# Patient Record
Sex: Male | Born: 1950 | Race: White | Hispanic: No | Marital: Married | Smoking: Former smoker
Health system: Southern US, Community
[De-identification: ages and names within clinical notes are randomized; demographics above are authoritative.]

## PROBLEM LIST (undated history)

## (undated) DIAGNOSIS — C801 Malignant (primary) neoplasm, unspecified: Secondary | ICD-10-CM

## (undated) DIAGNOSIS — M1990 Unspecified osteoarthritis, unspecified site: Secondary | ICD-10-CM

## (undated) DIAGNOSIS — I10 Essential (primary) hypertension: Secondary | ICD-10-CM

## (undated) DIAGNOSIS — E785 Hyperlipidemia, unspecified: Secondary | ICD-10-CM

## (undated) DIAGNOSIS — J189 Pneumonia, unspecified organism: Secondary | ICD-10-CM

## (undated) DIAGNOSIS — I519 Heart disease, unspecified: Secondary | ICD-10-CM

## (undated) HISTORY — DX: Essential (primary) hypertension: I10

## (undated) HISTORY — PX: CORONARY STENT PLACEMENT: SHX1402

## (undated) HISTORY — DX: Heart disease, unspecified: I51.9

## (undated) HISTORY — PX: OTHER SURGICAL HISTORY: SHX169

## (undated) HISTORY — DX: Hyperlipidemia, unspecified: E78.5

---

## 2000-05-31 ENCOUNTER — Ambulatory Visit (HOSPITAL_COMMUNITY): Admission: RE | Admit: 2000-05-31 | Discharge: 2000-05-31 | Payer: Self-pay | Admitting: Family Medicine

## 2000-05-31 ENCOUNTER — Encounter: Payer: Self-pay | Admitting: Family Medicine

## 2000-08-27 ENCOUNTER — Ambulatory Visit (HOSPITAL_COMMUNITY): Admission: RE | Admit: 2000-08-27 | Discharge: 2000-08-27 | Payer: Self-pay | Admitting: Gastroenterology

## 2000-09-30 ENCOUNTER — Other Ambulatory Visit: Admission: RE | Admit: 2000-09-30 | Discharge: 2000-09-30 | Payer: Self-pay | Admitting: General Surgery

## 2000-09-30 ENCOUNTER — Encounter (INDEPENDENT_AMBULATORY_CARE_PROVIDER_SITE_OTHER): Payer: Self-pay | Admitting: Specialist

## 2001-09-29 ENCOUNTER — Encounter: Payer: Self-pay | Admitting: Family Medicine

## 2001-09-29 ENCOUNTER — Ambulatory Visit (HOSPITAL_COMMUNITY): Admission: RE | Admit: 2001-09-29 | Discharge: 2001-09-29 | Payer: Self-pay | Admitting: Family Medicine

## 2003-07-28 ENCOUNTER — Ambulatory Visit (HOSPITAL_COMMUNITY): Admission: RE | Admit: 2003-07-28 | Discharge: 2003-07-28 | Payer: Self-pay | Admitting: Family Medicine

## 2008-12-03 ENCOUNTER — Ambulatory Visit (HOSPITAL_COMMUNITY): Admission: RE | Admit: 2008-12-03 | Discharge: 2008-12-03 | Payer: Self-pay | Admitting: General Surgery

## 2010-03-15 ENCOUNTER — Ambulatory Visit: Payer: Self-pay | Admitting: Diagnostic Radiology

## 2010-03-15 ENCOUNTER — Emergency Department (HOSPITAL_BASED_OUTPATIENT_CLINIC_OR_DEPARTMENT_OTHER): Admission: EM | Admit: 2010-03-15 | Discharge: 2010-03-15 | Payer: Self-pay | Admitting: Emergency Medicine

## 2010-09-22 LAB — COMPREHENSIVE METABOLIC PANEL
ALT: 27 U/L (ref 0–53)
BUN: 8 mg/dL (ref 6–23)
Calcium: 9.4 mg/dL (ref 8.4–10.5)
Creatinine, Ser: 0.9 mg/dL (ref 0.4–1.5)
Glucose, Bld: 114 mg/dL — ABNORMAL HIGH (ref 70–99)
Sodium: 140 mEq/L (ref 135–145)
Total Protein: 7.4 g/dL (ref 6.0–8.3)

## 2010-09-22 LAB — DIFFERENTIAL
Lymphocytes Relative: 21 % (ref 12–46)
Lymphs Abs: 2.1 10*3/uL (ref 0.7–4.0)
Monocytes Relative: 5 % (ref 3–12)
Neutro Abs: 7.2 10*3/uL (ref 1.7–7.7)
Neutrophils Relative %: 72 % (ref 43–77)

## 2010-09-22 LAB — CBC
Hemoglobin: 14.5 g/dL (ref 13.0–17.0)
MCHC: 33.9 g/dL (ref 30.0–36.0)
MCV: 94.6 fL (ref 78.0–100.0)
RDW: 13.8 % (ref 11.5–15.5)

## 2010-10-28 NOTE — Op Note (Signed)
NAMECARSYN, Andrew Solomon                ACCOUNT NO.:  [id number]   MEDICAL RECORD NO.:  [id number]          PATIENT TYPE:  AMB   LOCATION:  DAY                          FACILITY:  [facility]   PHYSICIAN:  Adolph Pollack, M.D.DATE OF BIRTH:  Mar 05, 1951   DATE OF PROCEDURE:  12/03/2008  DATE OF DISCHARGE:                               OPERATIVE REPORT   PREOPERATIVE DIAGNOSIS:  Left inguinal hernia.   POSTOPERATIVE DIAGNOSIS:  Direct left inguinal hernia.   PROCEDURE:  Left inguinal repair with mesh.   SURGEON:  Adolph Pollack, M.D.   ANESTHESIA:  General/LMA with Marcaine local.   INDICATIONS:  Mr. Andrew Solomon is a 60 year old male who for about a couple  years has noticed a left inguinal bulge and a known hernia.  He started  to become more symptomatic and it is becoming larger and he requests  repair.  We discussed a laparoscopic versus open technique and he has  chosen the open technique and now presents for repair.  Procedure, risks  and aftercare were discussed with him preoperatively.   DESCRIPTION OF PROCEDURE:  He was seen in the holding area and the left  groin marked with my initials.  He was then brought to the operating  room, placed supine on the operating table.  General anesthetic was  administered.  The hair in the left groin was clipped and the area was  sterilely prepped and draped.  Marcaine was infiltrated superficially  and deep in the left groin.  Left groin incision was made through the  skin, subcutaneous tissue and Scarpa fascia exposing the external  oblique aponeurosis.  Local anesthetic was infiltrated deep in the  external oblique aponeurosis and the external oblique aponeurosis was  cut medially through the external ring and laterally up toward the  anterior superior iliac spine.  The underlying ilioinguinal nerve was  identified, preserved and anesthetized.   Using blunt dissection I exposed the shelving edge of the inguinal  ligament inferiorly and the  internal oblique aponeurosis superiorly.  I  then isolated the spermatic cord, created a window around it.  A direct  hernia sac was adherent to the spermatic cord.  I dissected this free  using blunt dissection and reduced it.  I then kept the reduction by  loosely approximating the attenuated transversalis fascia with  interrupted 2-0 Vicryl sutures.   I examined the cord and no indirect sac was noted.   Following this a piece of three x a 6 inches polypropylene mesh was  brought onto the field and anchored 2 cm medial to the pubic tubercle  with 2-0 Prolene suture.  The inferior aspect of mesh was anchored to  the shelving edge of the inguinal ligament.  A running 2-0 Prolene  suture up the level 2 cm lateral to the internal ring.  A slit was cut  the mesh creating two tails and these were wrapped around the spermatic  cord.  These superior aspect of mesh was then anchored to the internal  oblique aponeurosis with interrupted 2-0 Vicryl sutures.  Following this  the two tails were crossed  creating a new internal ring and these were  anchored to the shelving edge of the inguinal ligament of 2-0 Prolene  suture.  The tip of the hemostat could be placed in new aperture.   After this I inspected the area and hemostasis was adequate.  The  lateral aspect of mesh was then tucked deep in the external oblique  aponeurosis which was closed over the mesh and cord with a running 3-0  Vicryl suture.  Scarpa fascia closed with running 3-0 Vicryl suture.  The skin was closed with 4-0 Monocryl subcuticular stitch followed by  Steri-Strips and sterile dressings.  The left testicle was in its normal  position in the scrotum.   He tolerated procedure without apparent complications and was taken to  recovery room in satisfactory condition.      Adolph Pollack, M.D.  Electronically Signed     TJR/MEDQ  D:  12/03/2008  T:  12/03/2008  Job:  [id number]   cc:   Holley Bouche, M.D.  Fax:  [phone]

## 2010-10-31 NOTE — Op Note (Signed)
[place]  Patient:    Andrew Solomon, Andrew Solomon                       MRN: [id number] Proc. Date: 08/27/00 Adm. Date:  [id number] Disc. Date: [id number] Attending:  Charna Elizabeth CC:         Arvella Merles, M.D.  Adolph Pollack, M.D.   Operative Report  DATE OF BIRTH:  [birth year].  PROCEDURE:  Colonoscopy with biopsies.  ENDOSCOPIST:  Anselmo Rod, M.D.  INSTRUMENT USED:  Olympus video colonoscope.  INDICATION FOR PROCEDURE:  Occasional rectal bleeding in a 60 year old white male.  Colorectal cancer screening is being done.  Rule out colonic polyps, masses, hemorrhoids, etc.  PREPROCEDURE PREPARATION:  Informed consent was procured from the patient. The patient was fasted for eight hours prior to the procedure and prepped with a bottle of magnesium citrate and a gallon of NuLytely the night prior to the procedure.  PREPROCEDURE PHYSICAL:  VITAL SIGNS:  The patient had stable vital signs.  NECK:  Supple.  CHEST:  Clear to auscultation.  S1, S2 regular.  ABDOMEN:  Soft with normal abdominal bowel sounds.  DESCRIPTION OF PROCEDURE:  The patient was placed in the left lateral decubitus position and sedated with 75 mg of Demerol and 10 mg of Versed intravenously.  Once the patient was adequately sedate and maintained on low-flow oxygen and continuous cardiac monitoring, the Olympus video colonoscope was advanced from the rectum to the cecum without difficulty. There were patchy areas of erythema in the left colon at 25 cm, biopsied for pathology.  The exact etiology of this seemed unclear.  There was a small polyp seen in the rectum _____ of the hemorrhoidal plexus, which was not biopsied or removed.  The rest of the colonic mucosa up to the _____ appeared normal.  IMPRESSION: 1. Patchy area of erythema in left colon at 25 cm, biopsied for pathology. 2. Small polyp in the rectum at the anal verge associated with a hemorrhoidal  plexus.  RECOMMENDATIONS: 1. Await pathology results. 2. Surgical evaluation by Dr. Abbey Chatters to remove the polyp of the rectum. 3. Outpatient follow-up in the next two weeks. DD:  08/27/00 TD:  08/28/00 Job: [id number]

## 2012-10-13 ENCOUNTER — Other Ambulatory Visit: Payer: Self-pay | Admitting: Orthopedic Surgery

## 2012-10-13 DIAGNOSIS — M84374A Stress fracture, right foot, initial encounter for fracture: Secondary | ICD-10-CM

## 2012-10-14 ENCOUNTER — Ambulatory Visit
Admission: RE | Admit: 2012-10-14 | Discharge: 2012-10-14 | Disposition: A | Discharge status: Home / Self Care | Payer: No Typology Code available for payment source | Source: Ambulatory Visit | Attending: Orthopedic Surgery | Admitting: Orthopedic Surgery

## 2012-10-14 ENCOUNTER — Inpatient Hospital Stay: Admission: RE | Admit: 2012-10-14 | Payer: Self-pay | Source: Ambulatory Visit

## 2012-10-14 DIAGNOSIS — M84374A Stress fracture, right foot, initial encounter for fracture: Secondary | ICD-10-CM

## 2013-08-24 DIAGNOSIS — L570 Actinic keratosis: Secondary | ICD-10-CM | POA: Diagnosis not present

## 2013-08-24 DIAGNOSIS — L930 Discoid lupus erythematosus: Secondary | ICD-10-CM | POA: Diagnosis not present

## 2013-08-24 DIAGNOSIS — L259 Unspecified contact dermatitis, unspecified cause: Secondary | ICD-10-CM | POA: Diagnosis not present

## 2013-08-24 DIAGNOSIS — L821 Other seborrheic keratosis: Secondary | ICD-10-CM | POA: Diagnosis not present

## 2013-08-24 DIAGNOSIS — L819 Disorder of pigmentation, unspecified: Secondary | ICD-10-CM | POA: Diagnosis not present

## 2013-08-24 DIAGNOSIS — D1801 Hemangioma of skin and subcutaneous tissue: Secondary | ICD-10-CM | POA: Diagnosis not present

## 2013-08-24 DIAGNOSIS — L219 Seborrheic dermatitis, unspecified: Secondary | ICD-10-CM | POA: Diagnosis not present

## 2013-09-22 DIAGNOSIS — F411 Generalized anxiety disorder: Secondary | ICD-10-CM | POA: Diagnosis not present

## 2013-09-22 DIAGNOSIS — Z0000 Encounter for general adult medical examination without abnormal findings: Secondary | ICD-10-CM | POA: Diagnosis not present

## 2013-09-22 DIAGNOSIS — Z87891 Personal history of nicotine dependence: Secondary | ICD-10-CM | POA: Diagnosis not present

## 2013-09-22 DIAGNOSIS — Z23 Encounter for immunization: Secondary | ICD-10-CM | POA: Diagnosis not present

## 2013-09-22 DIAGNOSIS — E785 Hyperlipidemia, unspecified: Secondary | ICD-10-CM | POA: Diagnosis not present

## 2013-09-22 DIAGNOSIS — I10 Essential (primary) hypertension: Secondary | ICD-10-CM | POA: Diagnosis not present

## 2013-09-22 DIAGNOSIS — Z125 Encounter for screening for malignant neoplasm of prostate: Secondary | ICD-10-CM | POA: Diagnosis not present

## 2013-09-22 DIAGNOSIS — Z136 Encounter for screening for cardiovascular disorders: Secondary | ICD-10-CM | POA: Diagnosis not present

## 2013-09-22 DIAGNOSIS — E291 Testicular hypofunction: Secondary | ICD-10-CM | POA: Diagnosis not present

## 2013-09-26 ENCOUNTER — Other Ambulatory Visit: Payer: Self-pay | Admitting: Family Medicine

## 2013-09-26 DIAGNOSIS — Z87891 Personal history of nicotine dependence: Secondary | ICD-10-CM

## 2013-09-29 DIAGNOSIS — I2510 Atherosclerotic heart disease of native coronary artery without angina pectoris: Secondary | ICD-10-CM | POA: Diagnosis not present

## 2013-09-29 DIAGNOSIS — E785 Hyperlipidemia, unspecified: Secondary | ICD-10-CM | POA: Diagnosis not present

## 2013-09-29 DIAGNOSIS — Z9861 Coronary angioplasty status: Secondary | ICD-10-CM | POA: Diagnosis not present

## 2013-09-29 DIAGNOSIS — I739 Peripheral vascular disease, unspecified: Secondary | ICD-10-CM | POA: Diagnosis not present

## 2013-11-09 ENCOUNTER — Ambulatory Visit
Admission: RE | Admit: 2013-11-09 | Discharge: 2013-11-09 | Disposition: A | Discharge status: Home / Self Care | Payer: Medicare Other | Source: Ambulatory Visit | Attending: Family Medicine | Admitting: Family Medicine

## 2013-11-09 ENCOUNTER — Encounter (INDEPENDENT_AMBULATORY_CARE_PROVIDER_SITE_OTHER): Payer: Self-pay

## 2013-11-09 DIAGNOSIS — Z136 Encounter for screening for cardiovascular disorders: Secondary | ICD-10-CM | POA: Diagnosis not present

## 2013-11-09 DIAGNOSIS — Z87891 Personal history of nicotine dependence: Secondary | ICD-10-CM

## 2013-11-10 ENCOUNTER — Ambulatory Visit: Payer: No Typology Code available for payment source

## 2013-11-10 DIAGNOSIS — R319 Hematuria, unspecified: Secondary | ICD-10-CM | POA: Diagnosis not present

## 2013-11-14 DIAGNOSIS — R310 Gross hematuria: Secondary | ICD-10-CM | POA: Diagnosis not present

## 2013-11-23 DIAGNOSIS — N529 Male erectile dysfunction, unspecified: Secondary | ICD-10-CM | POA: Diagnosis not present

## 2013-11-23 DIAGNOSIS — R310 Gross hematuria: Secondary | ICD-10-CM | POA: Diagnosis not present

## 2013-11-24 DIAGNOSIS — E291 Testicular hypofunction: Secondary | ICD-10-CM | POA: Diagnosis not present

## 2014-02-12 DIAGNOSIS — R0789 Other chest pain: Secondary | ICD-10-CM | POA: Diagnosis not present

## 2014-02-12 DIAGNOSIS — E785 Hyperlipidemia, unspecified: Secondary | ICD-10-CM | POA: Diagnosis not present

## 2014-02-12 DIAGNOSIS — Z9861 Coronary angioplasty status: Secondary | ICD-10-CM | POA: Diagnosis not present

## 2014-02-12 DIAGNOSIS — I2510 Atherosclerotic heart disease of native coronary artery without angina pectoris: Secondary | ICD-10-CM | POA: Diagnosis not present

## 2014-02-12 DIAGNOSIS — I739 Peripheral vascular disease, unspecified: Secondary | ICD-10-CM | POA: Diagnosis not present

## 2014-02-21 DIAGNOSIS — IMO0002 Reserved for concepts with insufficient information to code with codable children: Secondary | ICD-10-CM | POA: Diagnosis not present

## 2014-02-21 DIAGNOSIS — J309 Allergic rhinitis, unspecified: Secondary | ICD-10-CM | POA: Diagnosis not present

## 2014-02-21 DIAGNOSIS — L02519 Cutaneous abscess of unspecified hand: Secondary | ICD-10-CM | POA: Diagnosis not present

## 2014-03-02 DIAGNOSIS — Z23 Encounter for immunization: Secondary | ICD-10-CM | POA: Diagnosis not present

## 2014-03-15 DIAGNOSIS — Z955 Presence of coronary angioplasty implant and graft: Secondary | ICD-10-CM | POA: Diagnosis not present

## 2014-03-15 DIAGNOSIS — I739 Peripheral vascular disease, unspecified: Secondary | ICD-10-CM | POA: Diagnosis not present

## 2014-03-15 DIAGNOSIS — I2510 Atherosclerotic heart disease of native coronary artery without angina pectoris: Secondary | ICD-10-CM | POA: Diagnosis not present

## 2014-03-15 DIAGNOSIS — E785 Hyperlipidemia, unspecified: Secondary | ICD-10-CM | POA: Diagnosis not present

## 2014-03-15 DIAGNOSIS — R0789 Other chest pain: Secondary | ICD-10-CM | POA: Diagnosis not present

## 2015-11-13 ENCOUNTER — Telehealth: Payer: Self-pay | Admitting: Acute Care

## 2015-11-15 NOTE — Telephone Encounter (Signed)
Patient called back to schedule -prm

## 2015-11-21 ENCOUNTER — Ambulatory Visit
Admission: RE | Admit: 2015-11-21 | Discharge: 2015-11-21 | Disposition: A | Discharge status: Home / Self Care | Payer: Medicare Other | Source: Ambulatory Visit | Attending: Family Medicine | Admitting: Family Medicine

## 2015-11-21 ENCOUNTER — Other Ambulatory Visit: Payer: Self-pay | Admitting: Family Medicine

## 2015-11-21 DIAGNOSIS — J180 Bronchopneumonia, unspecified organism: Secondary | ICD-10-CM

## 2015-11-22 NOTE — Telephone Encounter (Signed)
Referring PCP called checking on status of appt -prm

## 2015-11-28 ENCOUNTER — Other Ambulatory Visit: Payer: Self-pay | Admitting: Acute Care

## 2015-11-28 DIAGNOSIS — Z87891 Personal history of nicotine dependence: Secondary | ICD-10-CM

## 2015-12-02 ENCOUNTER — Ambulatory Visit: Admission: RE | Admit: 2015-12-02 | Payer: Self-pay | Source: Ambulatory Visit

## 2015-12-02 ENCOUNTER — Ambulatory Visit (INDEPENDENT_AMBULATORY_CARE_PROVIDER_SITE_OTHER): Payer: Medicare Other | Admitting: Acute Care

## 2015-12-02 ENCOUNTER — Encounter: Payer: Self-pay | Admitting: Acute Care

## 2015-12-02 DIAGNOSIS — Z87891 Personal history of nicotine dependence: Secondary | ICD-10-CM | POA: Diagnosis not present

## 2015-12-02 NOTE — Progress Notes (Signed)
Shared Decision Making Visit Lung Cancer Screening Program [phone])   Eligibility:  Age 65 y.o.  Pack Years Smoking History Calculation 47 pack year smoking history (# packs/per year x # years smoked)  Recent History of coughing up blood  No   Unexplained weight loss? no ( >Than 15 pounds within the last 6 months )  Prior History Lung / other cancer no (Diagnosis within the last 5 years already requiring surveillance chest CT Scans).  Smoking Status Former Smoker  Former Smokers: Years since quit: 5 years  Quit Date: 2012  Visit Components:  Discussion included one or more decision making aids. yes  Discussion included risk/benefits of screening. yes  Discussion included potential follow up diagnostic testing for abnormal scans. yes  Discussion included meaning and risk of over diagnosis. yes  Discussion included meaning and risk of False Positives. yes  Discussion included meaning of total radiation exposure. yes  Counseling Included:  Importance of adherence to annual lung cancer LDCT screening. yes  Impact of comorbidities on ability to participate in the program. yes  Ability and willingness to under diagnostic treatment. yes  Smoking Cessation Counseling:  Current Smokers:   Discussed importance of smoking cessation. NA, former smoker  Information about tobacco cessation classes and interventions provided to patient. yes  Patient provided with "ticket" for LDCT Scan. yes  Symptomatic Patient. no  Counseling NA  Diagnosis Code: Tobacco Use Z72.0  Asymptomatic Patient yes  Counseling (Intermediate counseling: > three minutes counseling) [id number]  Former Smokers:   Discussed the importance of maintaining cigarette abstinence. yes  Diagnosis Code: Personal History of Nicotine Dependence. [id number]  Information about tobacco cessation classes and interventions provided to patient. Yes  Patient provided with "ticket" for LDCT Scan. yes  Written Order  for Lung Cancer Screening with LDCT placed in Epic. Yes (CT Chest Lung Cancer Screening Low Dose W/O CM) [id number] Z12.2-Screening of respiratory organs Z87.891-Personal history of nicotine dependence   I spent 20 minutes of face to face time with Andrew Solomon discussing the risks and benefits of lung cancer screening. We viewed a power point together that explained in detail the above noted topics. We took the time to pause the power point at intervals to allow for questions to be asked and answered to ensure understanding. We discussed that he had taken the single most powerful action possible to decrease his risk of developing lung cancer when he quit smoking. I counseled him to remain smoke free, and to contact me if he ever had the desire to smoke again so that I can provide resources and tools to help support the effort to remain smoke free. We discussed the time and location of the scan, and that either [place] or I will call with the results within  24-48 hours of receiving them. He has my card and contact information in the event he needs to speak with me, in addition to a copy of the power point we reviewed as a resource. He verbalized understanding of all of the above and had no further questions upon leaving the office.   Andrew Spatz, NP  12/02/2015

## 2015-12-03 ENCOUNTER — Ambulatory Visit
Admission: RE | Admit: 2015-12-03 | Discharge: 2015-12-03 | Disposition: A | Discharge status: Home / Self Care | Payer: Medicare Other | Source: Ambulatory Visit | Attending: Family Medicine | Admitting: Family Medicine

## 2015-12-03 ENCOUNTER — Other Ambulatory Visit: Payer: Self-pay | Admitting: Family Medicine

## 2015-12-03 DIAGNOSIS — J189 Pneumonia, unspecified organism: Secondary | ICD-10-CM

## 2015-12-03 DIAGNOSIS — Z09 Encounter for follow-up examination after completed treatment for conditions other than malignant neoplasm: Secondary | ICD-10-CM

## 2015-12-11 NOTE — Telephone Encounter (Signed)
Attempted to contact pt 3 times and call was disconnected each time. WCB.

## 2015-12-27 ENCOUNTER — Ambulatory Visit (INDEPENDENT_AMBULATORY_CARE_PROVIDER_SITE_OTHER)
Admission: RE | Admit: 2015-12-27 | Discharge: 2015-12-27 | Disposition: A | Discharge status: Home / Self Care | Payer: Medicare Other | Source: Ambulatory Visit | Attending: Acute Care | Admitting: Acute Care

## 2015-12-27 ENCOUNTER — Telehealth: Payer: Self-pay | Admitting: Acute Care

## 2015-12-27 DIAGNOSIS — Z87891 Personal history of nicotine dependence: Secondary | ICD-10-CM

## 2015-12-27 NOTE — Telephone Encounter (Signed)
I have called the results of Andrew Solomon CT scan to him. I explained that it was an abnormal scan, but that the radiologist who read the scan feels this is reflective of post infectious/ inflammatory scarring due to recent pneumonia. (Dr. Weber Cooks called me and spoke with me about best plan moving forward with this scan.) Andrew Solomon states he is still coughing from a recent bronchitis. I have encouraged him to get in touch with the MD who has been treating the bronchitis for further treatment if he feels he is not well. I explained that per Dr. Jonnie Kind suggestion, we will repeat the CT scan in 3 months to re-evaluate the inflammatory changes. He verbalized understanding and had no further questions.

## 2016-01-01 ENCOUNTER — Encounter: Payer: Self-pay | Admitting: Pulmonary Disease

## 2016-01-01 ENCOUNTER — Ambulatory Visit (INDEPENDENT_AMBULATORY_CARE_PROVIDER_SITE_OTHER): Payer: Medicare Other | Admitting: Pulmonary Disease

## 2016-01-01 VITALS — BP 108/68 | HR 75 | Ht 67.0 in | Wt 167.6 lb

## 2016-01-01 DIAGNOSIS — R918 Other nonspecific abnormal finding of lung field: Secondary | ICD-10-CM

## 2016-01-01 DIAGNOSIS — J432 Centrilobular emphysema: Secondary | ICD-10-CM

## 2016-01-01 NOTE — Progress Notes (Signed)
Past surgical history He  has past surgical history that includes Coronary stent placement (Left).  Family history His family history includes Bladder Cancer in his mother; Heart disease in his father; Pancreatic cancer in his father.  Social history He  reports that he quit smoking about 6 months ago. His smoking use included Cigarettes. He has a 47 pack-year smoking history. He does not have any smokeless tobacco history on file. He reports that he drinks alcohol. He reports that he does not use illicit drugs.  Allergies  Allergen Reactions  . Oxycodone   . Sulfur     No current outpatient prescriptions on file prior to visit.   No current facility-administered medications on file prior to visit.    Review of Systems  Constitutional: Negative for fever and unexpected weight change.  HENT: Negative for congestion, dental problem, ear pain, nosebleeds, postnasal drip, rhinorrhea, sinus pressure, sneezing, sore throat and trouble swallowing.   Eyes: Negative for redness and itching.  Respiratory: Positive for cough. Negative for chest tightness, shortness of breath and wheezing.   Cardiovascular: Negative for palpitations and leg swelling.  Gastrointestinal: Negative for nausea and vomiting.  Genitourinary: Negative for dysuria.  Musculoskeletal: Positive for joint swelling.  Skin: Negative for rash.  Neurological: Negative for headaches.  Hematological: Does not bruise/bleed easily.  Psychiatric/Behavioral: Negative for dysphoric mood. The patient is not nervous/anxious.     Chief Complaint  Patient presents with  . PULMONARY CONSULT    Referred by Andrew Solomon. Pt currently in [place]- seen 12/02/15. Recent CT  12/27/15.     Tests Low dose CT chest 12/27/15 >> atherosclerosis, calcified hilar nodes, multiple pulmonary nodules b/l, mild centrilobular and paraseptal emphysema  Past medical history He  has a past medical history of HTN  (hypertension); Hyperlipidemia; and Heart disease.  Vital signs BP 108/68 mmHg  Pulse 75  Ht 5\' 7"  (1.702 m)  Wt 167 lb 9.6 oz (76.023 kg)  BMI 26.24 kg/m2  SpO2 98%  History of present illness Andrew Solomon is a 65 y.o. male former smoker with abnormal CT chest.  He developed a respiratory infection a few months ago.  He was told he had the flu.  He wasn't really able to get over this, and eventually developed a pneumonia.  He had fever up to 101F.  He was on several course of therapy.  He was also started on albuterol inhaler.  During this time he was set up for low dose CT chest for lung cancer screening, given his history of smoking.  This showed right upper lobe ground glass area, mild emphysema, calcified granulomas, and atherosclerosis.  He was seen by his cardiologist and advised that coronary calcification was not significant.  He had several episodes of pneumonia over the past few years.  He had trouble with terrible episodes of bronchitis in his teen age years.  He was never told he had asthma.  He denies history of tuberculosis.  He works in Press photographer.  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  He denies animal exposures or sick exposures.  No recent travel history.  He is from [place].  He was never in the [employer].    He still has some chest congestion and feels like he can't always get a full breath.  Using albuterol has helped with this.  He will get episodes of wheezing if he exerts himself too much.  He denies sinus congestion, sore throat, or hemoptysis.  He is no longer having fever.  He denies sweats, weight loss, skin rash, or joint swelling.   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  Neuro - Normal strength, cranial nerves intact Skin - No rashes Psych - Normal mood, and behavior   CMP Latest Ref Rng 11/30/2008  Glucose 70 - 99 mg/dL 114(H)  BUN 6 - 23 mg/dL 8  Creatinine 0.4 - 1.5 mg/dL 0.90  Sodium 135 - 145 mEq/L 140  Potassium 3.5 - 5.1 mEq/L 3.5  Chloride 96 - 112 mEq/L 108  CO2 19 - 32 mEq/L 26  Calcium 8.4 - 10.5 mg/dL 9.4  Total Protein 6.0 - 8.3 g/dL 7.4  Total Bilirubin 0.3 - 1.2 mg/dL 0.6  Alkaline Phos 39 - 117 U/L 67  AST 0 - 37 U/L 30  ALT 0 - 53 U/L 27     CBC Latest Ref Rng 11/30/2008  WBC 4.0 - 10.5 K/uL 10.0  Hemoglobin 13.0 - 17.0 g/dL 14.5  Hematocrit 39.0 - 52.0 % 42.9  Platelets 150 - 400 K/uL 211    Dg Chest 2 View  12/03/2015  CLINICAL DATA:  Flu.  Pneumonia. EXAM: CHEST  2 VIEW COMPARISON:  11/21/2015.  03/15/2010. FINDINGS: Mediastinum and hilar structures are normal. Right apical and left lower lobe stable nodular interstitial changes consistent with scarring and/or granulomas disease. No focal acute infiltrate. Stable bilateral nipple shadows. Heart size normal. No acute bony abnormality. IMPRESSION: Changes consistent nodular pleural scarring and/or granulomas disease. No acute abnormality identified. Electronically Signed   By: Andrew Solomon  Register   On: 12/03/2015 10:12   Ct Chest Lung Ca Screen Low Dose W/o Cm  12/27/2015  CLINICAL DATA:  65 year old male former smoker (quit 5 years ago) with 47 pack year history of smoking. Lung cancer screening examination. EXAM: CT CHEST WITHOUT CONTRAST LOW-DOSE FOR LUNG CANCER SCREENING TECHNIQUE: Multidetector CT imaging of the chest was performed following the standard protocol without IV contrast. COMPARISON:  No priors. FINDINGS: Mediastinum/Nodes: Heart size is normal. There is no significant pericardial fluid, thickening or pericardial calcification. There is aortic atherosclerosis, as well as  atherosclerosis of the great vessels of the mediastinum and the coronary arteries, including calcified atherosclerotic plaque in the left anterior descending, left circumflex and right coronary arteries. No pathologically enlarged mediastinal or hilar lymph nodes. Please note that accurate exclusion of hilar adenopathy is limited on noncontrast CT scans. Several densely calcified right hilar lymph nodes are incidentally noted. Esophagus is unremarkable in appearance. No axillary lymphadenopathy. Lungs/Pleura: There are multiple pulmonary nodules scattered throughout the lungs bilaterally. The largest and most concerning of which is a mixed ground-glass attenuation and solid lesion in the right upper lobe which demonstrates a volume derived mean diameter of 41.7 mm for the ground-glass attenuation component and 8.1 mm for the solid component (image 79 of series 3). There are some nearby calcified granulomas in the right upper lobe, and this entire area in the right upper lobe is remarkable for some architectural distortion, and there is overlying pleural thickening and retraction superiorly and laterally which could indicate chronic post infectious/inflammatory scarring. Diffuse bronchial wall thickening with mild centrilobular and paraseptal  emphysema. No acute consolidative airspace disease. No pleural effusions. Upper abdomen: Aortic atherosclerosis. Musculoskeletal: There are no aggressive appearing lytic or blastic lesions noted in the visualized portions of the skeleton. IMPRESSION: 1. Lung-RADS Category 4BS, suspicious. Given the presence of the right upper lobe pneumonia on prior chest radiograph 10/29/2015, and partial interval resolution of the findings on more recent chest radiographs, despite the appearance of this lesion this is favored to reflect post infectious/inflammatory scarring. Additional imaging evaluation or consultation with pulmonary medicine or thoracic surgery recommended. Preferred imaging  followup is a repeat chest CT without contrast (please use the following order, "CT CHEST LCS NODULE FOLLOW-UP W/O CM"). 2. The "S" modifier above refers to potentially clinically significant non lung cancer related findings. Specifically, there is aortic atherosclerosis, in addition to 3 vessel coronary artery disease. Please note that although the presence of coronary artery calcium documents the presence of coronary artery disease, the severity of this disease and any potential stenosis cannot be assessed on this non-gated CT examination. Assessment for potential risk factor modification, dietary therapy or pharmacologic therapy may be warranted, if clinically indicated. 3. Mild diffuse bronchial wall thickening with mild centrilobular and paraseptal emphysema. 19. 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 These results were called by telephone at the time of interpretation on 12/27/2015 at 1:20 am to Andrew Solomon , who verbally acknowledged these results. Electronically Signed   By: Andrew Solomon M.D.   On: 12/27/2015 13:20     Discussion He had recent episodes of respiratory infections with bronchitis and pneumonia.  He has prior history of pneumonia over the past few years.  He is slowly recovering.  He has prior history of smoking.  His recent imaging studies are more suggestive of resolving pneumonia in right upper lung, and old granulomas likely from prior respiratory infections.  It is however possible that right upper lobe lesion could represents something more ominous, but this is less likely.   Assessment/plan  Pulmonary infiltrate right upper lung. - will schedule CT chest w/o contrast for October 2017 - I explained that waiting for follow up CT chest would not likely change diagnostic or treatment options, but could allow [place] to avoid additional testing if CT chest shows improvement  Emphysema with hx of tobacco abuse. - will arrange for pulmonary function testing to further  asssess - continue prn albuterol for now  Coronary atherosclerosis on CT chest. - he is followed by Andrew Solomon   Patient Instructions  Will schedule pulmonary function test Follow up in October 2017 after CT chest done     Andrew Mires, Andrew Solomon  Pulmonary/Critical Care/Sleep Pager:  [phone] 01/01/2016, 4:40 PM

## 2016-01-01 NOTE — Progress Notes (Signed)
   Subjective:    Patient ID: Andrew Solomon, male    DOB: [birth year], 65 y.o.   MRN: [id number]  HPI    Review of Systems  Constitutional: Negative for fever and unexpected weight change.  HENT: Negative for congestion, dental problem, ear pain, nosebleeds, postnasal drip, rhinorrhea, sinus pressure, sneezing, sore throat and trouble swallowing.   Eyes: Negative for redness and itching.  Respiratory: Positive for cough. Negative for chest tightness, shortness of breath and wheezing.   Cardiovascular: Negative for palpitations and leg swelling.  Gastrointestinal: Negative for nausea and vomiting.  Genitourinary: Negative for dysuria.  Musculoskeletal: Positive for joint swelling.  Skin: Negative for rash.  Neurological: Negative for headaches.  Hematological: Does not bruise/bleed easily.  Psychiatric/Behavioral: Negative for dysphoric mood. The patient is not nervous/anxious.        Objective:   Physical Exam        Assessment & Plan:

## 2016-01-01 NOTE — Patient Instructions (Addendum)
Will schedule pulmonary function test Follow up in October 2017 after CT chest done

## 2016-01-03 NOTE — Telephone Encounter (Signed)
Referral Notes     Type Date User   General 11/28/2015 10:44 AM Abigal Choung P        Note   Called spoke with pt. Reviewed the lung cancer screening with him and he does qualify. Scheduled SDMV 12/02/15 with SG. CT order placed. Nothing further needed.      Nothing further needed.

## 2016-01-31 ENCOUNTER — Institutional Professional Consult (permissible substitution): Payer: Self-pay | Admitting: Internal Medicine

## 2016-03-06 ENCOUNTER — Encounter (INDEPENDENT_AMBULATORY_CARE_PROVIDER_SITE_OTHER): Payer: Medicare Other | Admitting: Pulmonary Disease

## 2016-03-06 DIAGNOSIS — J432 Centrilobular emphysema: Secondary | ICD-10-CM | POA: Diagnosis not present

## 2016-03-06 LAB — PULMONARY FUNCTION TEST
DL/VA % pred: 92 %
DL/VA: 4.04 ml/min/mmHg/L
DLCO COR % PRED: 83 %
DLCO COR: 23.03 ml/min/mmHg
DLCO UNC: 19.75 ml/min/mmHg
DLCO unc % pred: 71 %
FEF 25-75 POST: 2.02 L/s
FEF 25-75 PRE: 1.28 L/s
FEF2575-%Change-Post: 58 %
FEF2575-%PRED-PRE: 56 %
FEF2575-%Pred-Post: 88 %
FEV1-%Change-Post: 12 %
FEV1-%PRED-POST: 91 %
FEV1-%PRED-PRE: 81 %
FEV1-POST: 2.67 L
FEV1-Pre: 2.37 L
FEV1FVC-%Change-Post: 4 %
FEV1FVC-%PRED-PRE: 91 %
FEV6-%CHANGE-POST: 8 %
FEV6-%PRED-POST: 101 %
FEV6-%PRED-PRE: 93 %
FEV6-POST: 3.75 L
FEV6-Pre: 3.46 L
FEV6FVC-%CHANGE-POST: 0 %
FEV6FVC-%PRED-POST: 105 %
FEV6FVC-%Pred-Pre: 104 %
FVC-%Change-Post: 7 %
FVC-%PRED-POST: 95 %
FVC-%PRED-PRE: 89 %
FVC-POST: 3.78 L
FVC-PRE: 3.51 L
POST FEV6/FVC RATIO: 99 %
PRE FEV1/FVC RATIO: 67 %
Post FEV1/FVC ratio: 71 %
Pre FEV6/FVC Ratio: 99 %
RV % pred: 128 %
RV: 2.85 L
TLC % PRED: 106 %
TLC: 6.74 L

## 2016-03-30 ENCOUNTER — Telehealth: Payer: Self-pay | Admitting: Pulmonary Disease

## 2016-03-30 ENCOUNTER — Ambulatory Visit
Admission: RE | Admit: 2016-03-30 | Discharge: 2016-03-30 | Disposition: A | Discharge status: Home / Self Care | Payer: Medicare Other | Source: Ambulatory Visit | Attending: Pulmonary Disease | Admitting: Pulmonary Disease

## 2016-03-30 ENCOUNTER — Ambulatory Visit (INDEPENDENT_AMBULATORY_CARE_PROVIDER_SITE_OTHER)
Admission: RE | Admit: 2016-03-30 | Discharge: 2016-03-30 | Disposition: A | Discharge status: Home / Self Care | Payer: Medicare Other | Source: Ambulatory Visit

## 2016-03-30 DIAGNOSIS — R918 Other nonspecific abnormal finding of lung field: Secondary | ICD-10-CM

## 2016-03-30 DIAGNOSIS — Z87891 Personal history of nicotine dependence: Secondary | ICD-10-CM

## 2016-03-30 NOTE — Telephone Encounter (Signed)
Okay to change order  

## 2016-03-30 NOTE — Telephone Encounter (Signed)
Spoke with Andrew Solomon in [place]. She is aware to use SG's order for Lung Cancer CT. Nothing further was needed.

## 2016-03-30 NOTE — Telephone Encounter (Signed)
Spoke with Lattie Haw at [place] on [facility] -- calling about the orders placed for CT follow up.  Pt is a part of the [place] and per Lattie Haw the Radiologists called her this morning stating that the patient's follow up CT must be a Low Dose Nodule Screen not a regular CT w/o contrast d/t there being findings of Lung-RADS Category 4BS on the 12/27/15 scan. Requesting permission to scan the patient under the order placed by Sarah for Lung Nodule follow up and cancel the regular CT w/o order.  Lattie Haw states that if the patient cannot be scanned as the Low Dose Nodule f/u then Dr Weber Cooks requests a call to discuss. Please advise Dr Halford Chessman. Thanks.

## 2016-03-31 ENCOUNTER — Telehealth: Payer: Self-pay | Admitting: Acute Care

## 2016-03-31 DIAGNOSIS — Z87891 Personal history of nicotine dependence: Secondary | ICD-10-CM

## 2016-03-31 NOTE — Telephone Encounter (Signed)
I have called Andrew Solomon with the results of his follow up  low-dose CT scan for lung cancer screening. I explained to him that his scan was read as a Lung RADS 2: nodules that are benign in appearance and behavior with a very low likelihood of becoming a clinically active cancer due to size or lack of growth. Recommendation per radiology is for a repeat LDCT in 12 months. I explained that we would order and schedule his follow-up exam for October 2018. I also explained to him that there was a finding of aortic atherosclerosis in addition to three-vessel coronary artery disease. I explained that this was a common finding with this exam, and that while presence of calcification is noted degree and severity cannot be determined by this non-gated exam. Andrew Solomon is currently being followed by Dr. Tollie Eth, cardiology,  and is taking Crestor. He has cholesterol checks 3 times annually and his cholesterol and triglycerides are well controlled at present. Andrew Solomon verbalized understanding of the above and had no further questions at completion of the call. He has my contact information in the event he has any further questions in the future.

## 2016-06-02 ENCOUNTER — Telehealth: Payer: Self-pay | Admitting: *Deleted

## 2016-06-02 NOTE — Telephone Encounter (Signed)
-----   Message from Chesley Mires, MD sent at 05/21/2016  3:52 PM EST ----- Mr. Tabora was supposed to have ROV with me after doing his PFT and CT chest.  It doesn't look like ROV was scheduled.  Can you please arrange for this.

## 2016-06-02 NOTE — Telephone Encounter (Signed)
Pt states that he has received CT scan results but still needs PFT results.  Pt not able to come in any earlier than after the new year. Pt scheduled for appt with Dr Halford Chessman 06/19/16 @ 11:15. Nothing further needed.

## 2016-06-19 ENCOUNTER — Ambulatory Visit (INDEPENDENT_AMBULATORY_CARE_PROVIDER_SITE_OTHER): Payer: Medicare Other | Admitting: Pulmonary Disease

## 2016-06-19 ENCOUNTER — Encounter: Payer: Self-pay | Admitting: Pulmonary Disease

## 2016-06-19 VITALS — BP 122/70 | HR 71 | Ht 67.0 in | Wt 177.6 lb

## 2016-06-19 DIAGNOSIS — R918 Other nonspecific abnormal finding of lung field: Secondary | ICD-10-CM

## 2016-06-19 DIAGNOSIS — J432 Centrilobular emphysema: Secondary | ICD-10-CM | POA: Diagnosis not present

## 2016-06-19 DIAGNOSIS — Z7722 Contact with and (suspected) exposure to environmental tobacco smoke (acute) (chronic): Secondary | ICD-10-CM

## 2016-06-19 NOTE — Patient Instructions (Signed)
Ask Dr. 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

## 2016-06-19 NOTE — Progress Notes (Signed)
Current Outpatient Prescriptions on File Prior to Visit  Medication Sig  . atenolol (TENORMIN) 25 MG tablet Take 25 mg by mouth daily.  Marland Kitchen buPROPion (WELLBUTRIN SR) 150 MG 12 hr tablet Take 150 mg by mouth daily.  . Cholecalciferol (VITAMIN D3) 1000 units CAPS Take 1 capsule by mouth daily.  . folic acid (FOLVITE) A999333 MCG tablet Take 400 mcg by mouth daily.  . Pyridoxine HCl (VITAMIN B-6) 500 MG tablet Take 500 mg by mouth daily.  . rosuvastatin (CRESTOR) 20 MG tablet Take 20 mg by mouth daily.  . Selenium (SELENIMIN PO) Take 1 capsule by mouth daily.  . vitamin B-12 (CYANOCOBALAMIN) 1000 MCG tablet Take 1,000 mcg by mouth daily.   No current facility-administered medications on file prior to visit.      Chief Complaint  Patient presents with  . Follow-up    Review PFT and CT. Denies any problems with breathing since last OV     Tests Low dose CT chest 12/27/15 >> atherosclerosis, calcified hilar nodes, multiple pulmonary nodules b/l, mild centrilobular and paraseptal emphysema PFT 03/06/16 >> FEV1 2.67 (91%), FEV1% 71, TLC 6.74 (106%), DLCO 71%, +BD Low dose CT chest 03/30/16 >> multiple nodules   Past medical history HTN, HLD, CAD  Past surgical history, Family history, Social history, Allergies all reviewed.  Vital Signs BP 122/70 (BP Location: Left Arm, Cuff Size: Normal)   Pulse 71   Ht 5\' 7"  (1.702 m)   Wt 177 lb 9.6 oz (80.6 kg)   SpO2 99%   BMI 27.82 kg/m   History of Present Illness Andrew Solomon is a 66 y.o. male former smoker with COPD/emphysema and asthma, lung nodules.  He is here to review CT chest and PFT.  CT chest stable nodules.  PFT showed mild COPD, bronchodilator response, and mild diffusion defect.  He does not have significant symptoms.  He denies cough, wheeze, sputum, chest pain, leg swelling.  Doesn't feel like his breathing limits his activity unless he bends over.  Then he feels like he can't get a full breath.  He has a proair inhaler, but  doesn't feel the need to use it.   Physical Exam  General - No distress ENT - No sinus tenderness, no oral exudate, no LAN Cardiac - s1s2 regular, no murmur Chest - No wheeze/rales/dullness Back - No focal tenderness Abd - Soft, non-tender Ext - No edema Neuro - Normal strength Skin - No rashes Psych - normal mood, and behavior   Assessment/Plan  COPD with emphysema and asthma. - minimal symptoms - advised him to d/w his PCP about getting prevnar vaccination - prn proair  Lung cancer screening. - follow up low dose CT chest in October 2018  Second hand tobacco exposure. - he will d/w his wife his recent CT and PFT findings, and hopefully this will help motivate her to stop smoking   Patient Instructions  Ask Dr. 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

## 2016-09-25 DIAGNOSIS — L570 Actinic keratosis: Secondary | ICD-10-CM | POA: Diagnosis not present

## 2016-09-25 DIAGNOSIS — L812 Freckles: Secondary | ICD-10-CM | POA: Diagnosis not present

## 2016-09-25 DIAGNOSIS — Z85828 Personal history of other malignant neoplasm of skin: Secondary | ICD-10-CM | POA: Diagnosis not present

## 2016-09-25 DIAGNOSIS — D1801 Hemangioma of skin and subcutaneous tissue: Secondary | ICD-10-CM | POA: Diagnosis not present

## 2016-09-25 DIAGNOSIS — L821 Other seborrheic keratosis: Secondary | ICD-10-CM | POA: Diagnosis not present

## 2016-09-25 DIAGNOSIS — L218 Other seborrheic dermatitis: Secondary | ICD-10-CM | POA: Diagnosis not present

## 2016-10-16 DIAGNOSIS — F17201 Nicotine dependence, unspecified, in remission: Secondary | ICD-10-CM | POA: Diagnosis not present

## 2016-10-16 DIAGNOSIS — F419 Anxiety disorder, unspecified: Secondary | ICD-10-CM | POA: Diagnosis not present

## 2016-10-16 DIAGNOSIS — Z125 Encounter for screening for malignant neoplasm of prostate: Secondary | ICD-10-CM | POA: Diagnosis not present

## 2016-10-16 DIAGNOSIS — E785 Hyperlipidemia, unspecified: Secondary | ICD-10-CM | POA: Diagnosis not present

## 2016-10-16 DIAGNOSIS — Z0000 Encounter for general adult medical examination without abnormal findings: Secondary | ICD-10-CM | POA: Diagnosis not present

## 2016-10-16 DIAGNOSIS — M545 Low back pain: Secondary | ICD-10-CM | POA: Diagnosis not present

## 2016-11-06 DIAGNOSIS — M545 Low back pain: Secondary | ICD-10-CM | POA: Diagnosis not present

## 2016-11-06 DIAGNOSIS — M546 Pain in thoracic spine: Secondary | ICD-10-CM | POA: Diagnosis not present

## 2016-11-12 DIAGNOSIS — M546 Pain in thoracic spine: Secondary | ICD-10-CM | POA: Diagnosis not present

## 2016-11-12 DIAGNOSIS — M545 Low back pain: Secondary | ICD-10-CM | POA: Diagnosis not present

## 2016-11-17 DIAGNOSIS — M546 Pain in thoracic spine: Secondary | ICD-10-CM | POA: Diagnosis not present

## 2016-11-17 DIAGNOSIS — M545 Low back pain: Secondary | ICD-10-CM | POA: Diagnosis not present

## 2016-11-20 DIAGNOSIS — M545 Low back pain: Secondary | ICD-10-CM | POA: Diagnosis not present

## 2016-11-20 DIAGNOSIS — M546 Pain in thoracic spine: Secondary | ICD-10-CM | POA: Diagnosis not present

## 2016-11-24 DIAGNOSIS — M546 Pain in thoracic spine: Secondary | ICD-10-CM | POA: Diagnosis not present

## 2016-11-24 DIAGNOSIS — M545 Low back pain: Secondary | ICD-10-CM | POA: Diagnosis not present

## 2016-11-27 DIAGNOSIS — M546 Pain in thoracic spine: Secondary | ICD-10-CM | POA: Diagnosis not present

## 2016-11-27 DIAGNOSIS — M545 Low back pain: Secondary | ICD-10-CM | POA: Diagnosis not present

## 2016-12-01 DIAGNOSIS — M546 Pain in thoracic spine: Secondary | ICD-10-CM | POA: Diagnosis not present

## 2016-12-01 DIAGNOSIS — M545 Low back pain: Secondary | ICD-10-CM | POA: Diagnosis not present

## 2016-12-04 DIAGNOSIS — M546 Pain in thoracic spine: Secondary | ICD-10-CM | POA: Diagnosis not present

## 2016-12-04 DIAGNOSIS — M545 Low back pain: Secondary | ICD-10-CM | POA: Diagnosis not present

## 2017-02-23 DIAGNOSIS — R05 Cough: Secondary | ICD-10-CM | POA: Diagnosis not present

## 2017-02-23 DIAGNOSIS — J069 Acute upper respiratory infection, unspecified: Secondary | ICD-10-CM | POA: Diagnosis not present

## 2017-02-25 DIAGNOSIS — Z955 Presence of coronary angioplasty implant and graft: Secondary | ICD-10-CM | POA: Diagnosis not present

## 2017-02-25 DIAGNOSIS — I2510 Atherosclerotic heart disease of native coronary artery without angina pectoris: Secondary | ICD-10-CM | POA: Diagnosis not present

## 2017-02-25 DIAGNOSIS — E785 Hyperlipidemia, unspecified: Secondary | ICD-10-CM | POA: Diagnosis not present

## 2017-02-25 DIAGNOSIS — I739 Peripheral vascular disease, unspecified: Secondary | ICD-10-CM | POA: Diagnosis not present

## 2017-04-01 ENCOUNTER — Ambulatory Visit (INDEPENDENT_AMBULATORY_CARE_PROVIDER_SITE_OTHER)
Admission: RE | Admit: 2017-04-01 | Discharge: 2017-04-01 | Disposition: A | Discharge status: Home / Self Care | Payer: Medicare Other | Source: Ambulatory Visit | Attending: Acute Care | Admitting: Acute Care

## 2017-04-01 DIAGNOSIS — Z87891 Personal history of nicotine dependence: Secondary | ICD-10-CM | POA: Diagnosis not present

## 2017-04-05 ENCOUNTER — Other Ambulatory Visit: Payer: Self-pay | Admitting: Acute Care

## 2017-04-05 DIAGNOSIS — Z122 Encounter for screening for malignant neoplasm of respiratory organs: Secondary | ICD-10-CM

## 2017-04-05 DIAGNOSIS — Z87891 Personal history of nicotine dependence: Secondary | ICD-10-CM

## 2017-04-23 DIAGNOSIS — M5441 Lumbago with sciatica, right side: Secondary | ICD-10-CM | POA: Diagnosis not present

## 2017-04-23 DIAGNOSIS — F419 Anxiety disorder, unspecified: Secondary | ICD-10-CM | POA: Diagnosis not present

## 2017-04-23 DIAGNOSIS — Z23 Encounter for immunization: Secondary | ICD-10-CM | POA: Diagnosis not present

## 2017-04-23 DIAGNOSIS — F17201 Nicotine dependence, unspecified, in remission: Secondary | ICD-10-CM | POA: Diagnosis not present

## 2017-04-23 DIAGNOSIS — I700 Atherosclerosis of aorta: Secondary | ICD-10-CM | POA: Diagnosis not present

## 2017-05-20 DIAGNOSIS — E785 Hyperlipidemia, unspecified: Secondary | ICD-10-CM | POA: Diagnosis not present

## 2017-05-20 DIAGNOSIS — I739 Peripheral vascular disease, unspecified: Secondary | ICD-10-CM | POA: Diagnosis not present

## 2017-05-20 DIAGNOSIS — I2510 Atherosclerotic heart disease of native coronary artery without angina pectoris: Secondary | ICD-10-CM | POA: Diagnosis not present

## 2017-05-20 DIAGNOSIS — Z955 Presence of coronary angioplasty implant and graft: Secondary | ICD-10-CM | POA: Diagnosis not present

## 2017-06-18 DIAGNOSIS — I739 Peripheral vascular disease, unspecified: Secondary | ICD-10-CM | POA: Diagnosis not present

## 2017-06-18 DIAGNOSIS — E7849 Other hyperlipidemia: Secondary | ICD-10-CM | POA: Diagnosis not present

## 2017-06-18 DIAGNOSIS — E785 Hyperlipidemia, unspecified: Secondary | ICD-10-CM | POA: Diagnosis not present

## 2017-06-18 DIAGNOSIS — I2510 Atherosclerotic heart disease of native coronary artery without angina pectoris: Secondary | ICD-10-CM | POA: Diagnosis not present

## 2017-06-18 DIAGNOSIS — Z955 Presence of coronary angioplasty implant and graft: Secondary | ICD-10-CM | POA: Diagnosis not present

## 2017-07-23 DIAGNOSIS — J0100 Acute maxillary sinusitis, unspecified: Secondary | ICD-10-CM | POA: Diagnosis not present

## 2017-12-02 DIAGNOSIS — M9903 Segmental and somatic dysfunction of lumbar region: Secondary | ICD-10-CM | POA: Diagnosis not present

## 2017-12-02 DIAGNOSIS — M5134 Other intervertebral disc degeneration, thoracic region: Secondary | ICD-10-CM | POA: Diagnosis not present

## 2017-12-02 DIAGNOSIS — M9902 Segmental and somatic dysfunction of thoracic region: Secondary | ICD-10-CM | POA: Diagnosis not present

## 2017-12-02 DIAGNOSIS — M5136 Other intervertebral disc degeneration, lumbar region: Secondary | ICD-10-CM | POA: Diagnosis not present

## 2017-12-07 DIAGNOSIS — M9903 Segmental and somatic dysfunction of lumbar region: Secondary | ICD-10-CM | POA: Diagnosis not present

## 2017-12-07 DIAGNOSIS — M5136 Other intervertebral disc degeneration, lumbar region: Secondary | ICD-10-CM | POA: Diagnosis not present

## 2017-12-07 DIAGNOSIS — M9902 Segmental and somatic dysfunction of thoracic region: Secondary | ICD-10-CM | POA: Diagnosis not present

## 2017-12-07 DIAGNOSIS — M5134 Other intervertebral disc degeneration, thoracic region: Secondary | ICD-10-CM | POA: Diagnosis not present

## 2017-12-10 DIAGNOSIS — Z0000 Encounter for general adult medical examination without abnormal findings: Secondary | ICD-10-CM | POA: Diagnosis not present

## 2017-12-10 DIAGNOSIS — R05 Cough: Secondary | ICD-10-CM | POA: Diagnosis not present

## 2017-12-10 DIAGNOSIS — M549 Dorsalgia, unspecified: Secondary | ICD-10-CM | POA: Diagnosis not present

## 2017-12-10 DIAGNOSIS — Z23 Encounter for immunization: Secondary | ICD-10-CM | POA: Diagnosis not present

## 2017-12-10 DIAGNOSIS — Z125 Encounter for screening for malignant neoplasm of prostate: Secondary | ICD-10-CM | POA: Diagnosis not present

## 2017-12-10 DIAGNOSIS — E785 Hyperlipidemia, unspecified: Secondary | ICD-10-CM | POA: Diagnosis not present

## 2017-12-10 DIAGNOSIS — F419 Anxiety disorder, unspecified: Secondary | ICD-10-CM | POA: Diagnosis not present

## 2017-12-10 DIAGNOSIS — K4090 Unilateral inguinal hernia, without obstruction or gangrene, not specified as recurrent: Secondary | ICD-10-CM | POA: Diagnosis not present

## 2017-12-13 DIAGNOSIS — M5134 Other intervertebral disc degeneration, thoracic region: Secondary | ICD-10-CM | POA: Diagnosis not present

## 2017-12-13 DIAGNOSIS — M9903 Segmental and somatic dysfunction of lumbar region: Secondary | ICD-10-CM | POA: Diagnosis not present

## 2017-12-13 DIAGNOSIS — M5136 Other intervertebral disc degeneration, lumbar region: Secondary | ICD-10-CM | POA: Diagnosis not present

## 2017-12-13 DIAGNOSIS — M9902 Segmental and somatic dysfunction of thoracic region: Secondary | ICD-10-CM | POA: Diagnosis not present

## 2018-04-07 ENCOUNTER — Inpatient Hospital Stay: Admission: RE | Admit: 2018-04-07 | Payer: Self-pay | Source: Ambulatory Visit

## 2018-04-11 ENCOUNTER — Other Ambulatory Visit: Payer: Self-pay | Admitting: Cardiology

## 2018-04-11 NOTE — Telephone Encounter (Signed)
Patient needs Ezetimibe 10mg  tablets, 90 days

## 2018-04-12 MED ORDER — EZETIMIBE 10 MG PO TABS: 10.0000 mg | ORAL_TABLET | Freq: Every day | ORAL | 3 refills | Status: DC

## 2018-04-12 MED ORDER — EZETIMIBE 10 MG PO TABS: 10.0000 mg | ORAL_TABLET | Freq: Every day | ORAL | 0 refills | Status: DC

## 2018-04-12 NOTE — Addendum Note (Signed)
Addended by: Austin Miles on: 04/12/2018 10:34 AM   Modules accepted: Orders

## 2018-04-12 NOTE — Telephone Encounter (Signed)
Refill for zetia 10 mg sent to Andrew Solomon in [place] as requested. Patient has a follow up appointment scheduled with Dr. Agustin Cree on 08/05/2018.

## 2018-04-22 ENCOUNTER — Inpatient Hospital Stay: Admission: RE | Admit: 2018-04-22 | Payer: Self-pay | Source: Ambulatory Visit

## 2018-04-22 ENCOUNTER — Ambulatory Visit (INDEPENDENT_AMBULATORY_CARE_PROVIDER_SITE_OTHER)
Admission: RE | Admit: 2018-04-22 | Discharge: 2018-04-22 | Disposition: A | Discharge status: Home / Self Care | Payer: Medicare Other | Source: Ambulatory Visit | Attending: Acute Care | Admitting: Acute Care

## 2018-04-22 DIAGNOSIS — Z87891 Personal history of nicotine dependence: Secondary | ICD-10-CM

## 2018-04-22 DIAGNOSIS — Z122 Encounter for screening for malignant neoplasm of respiratory organs: Secondary | ICD-10-CM

## 2018-04-25 ENCOUNTER — Other Ambulatory Visit: Payer: Self-pay | Admitting: Acute Care

## 2018-04-25 DIAGNOSIS — Z122 Encounter for screening for malignant neoplasm of respiratory organs: Secondary | ICD-10-CM

## 2018-04-25 DIAGNOSIS — Z87891 Personal history of nicotine dependence: Secondary | ICD-10-CM

## 2018-05-20 DIAGNOSIS — F419 Anxiety disorder, unspecified: Secondary | ICD-10-CM | POA: Diagnosis not present

## 2018-05-20 DIAGNOSIS — Z23 Encounter for immunization: Secondary | ICD-10-CM | POA: Diagnosis not present

## 2018-06-12 IMAGING — CT CT CHEST LUNG CANCER SCREENING LOW DOSE W/O CM
1 of 2 series · 10 of 20 positions shown, 13 images · non-contrast
Comparison: Low-dose lung cancer screening chest CT 03/30/2016.

CLINICAL DATA: 66-year-old male former smoker (quit in May 2015) with 47 pack year history of smoking. Lung cancer screening
examination.

EXAM:
CT CHEST WITHOUT CONTRAST LOW-DOSE FOR LUNG CANCER SCREENING
TECHNIQUE: Multidetector CT imaging of the chest was performed following the
standard protocol without IV contrast.

[ct lung segmentation data · axial · 0.75mm/px · z∈[-353,-353]mm · 10 of 320 frames shown]
[frame 1/320  mediastinal]
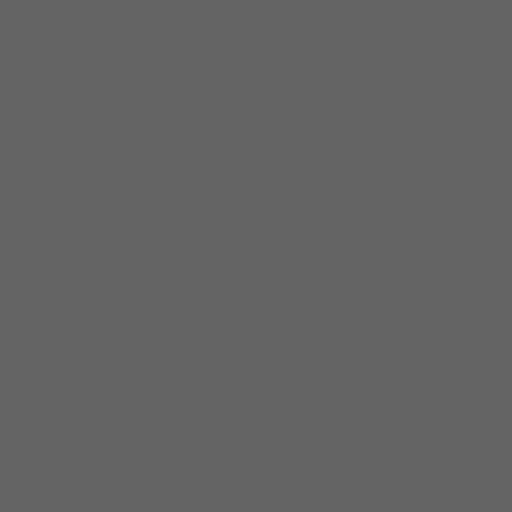
[frame 1/320  lung]
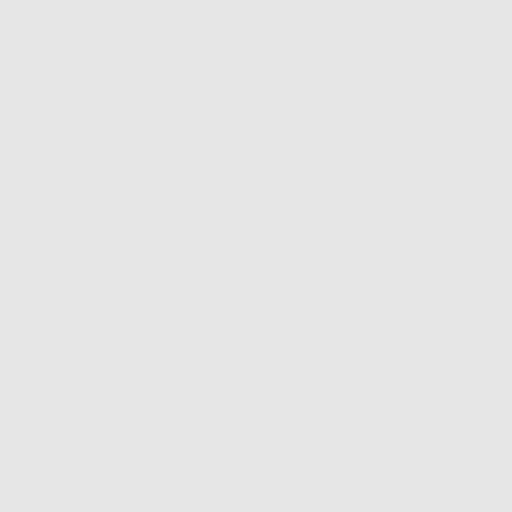
[frame 36/320  lung]
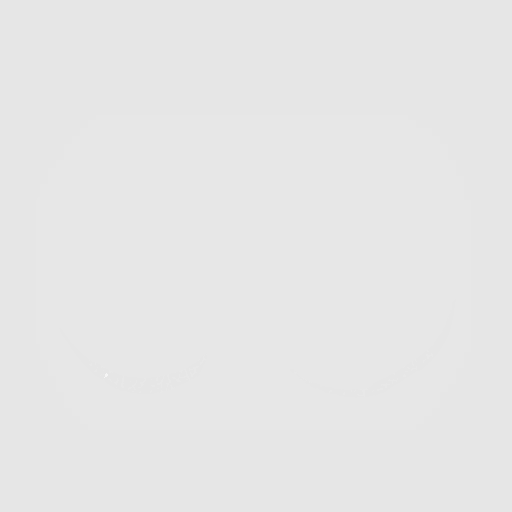
[frame 71/320  lung]
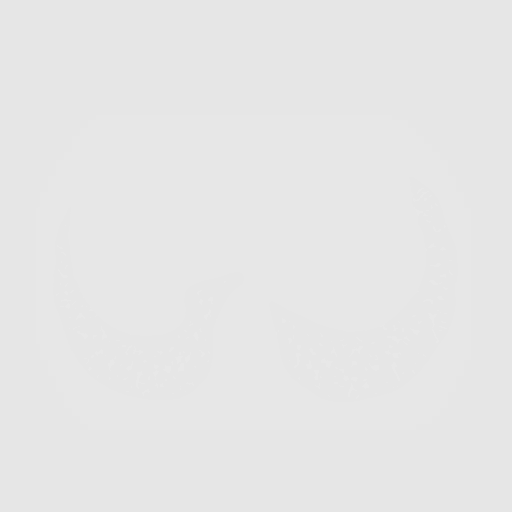
[frame 107/320  lung]
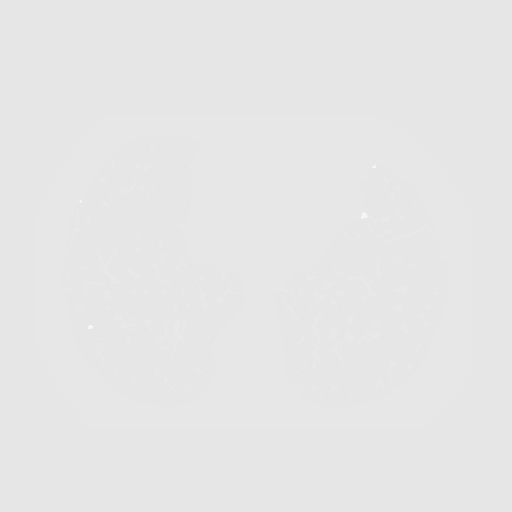
[frame 142/320  mediastinal]
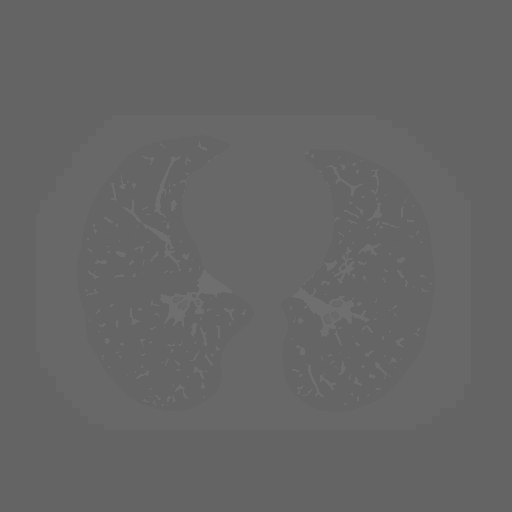
[frame 142/320  lung]
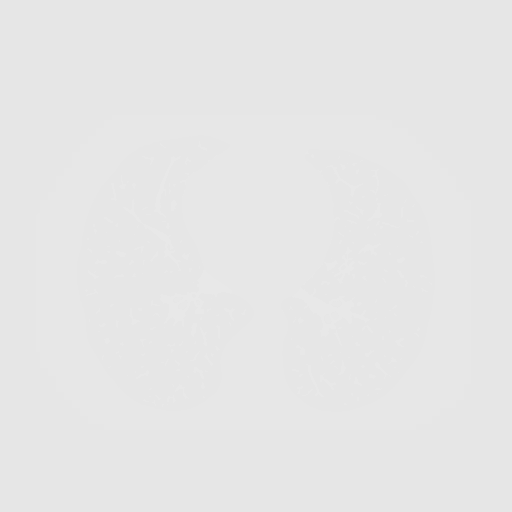
[frame 178/320  lung]
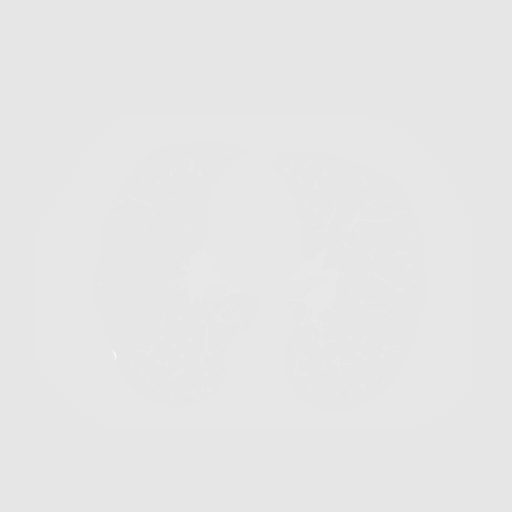
[frame 213/320  lung]
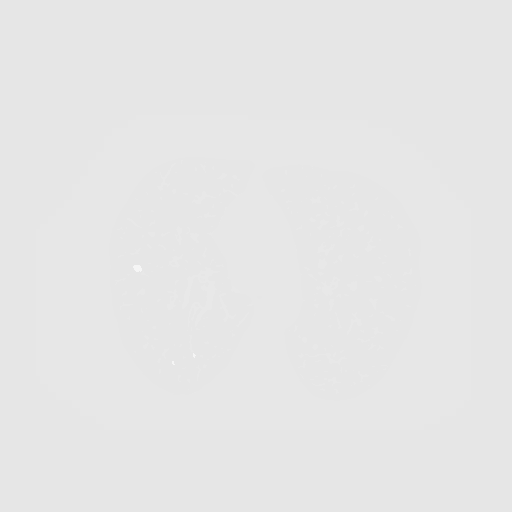
[frame 249/320  lung]
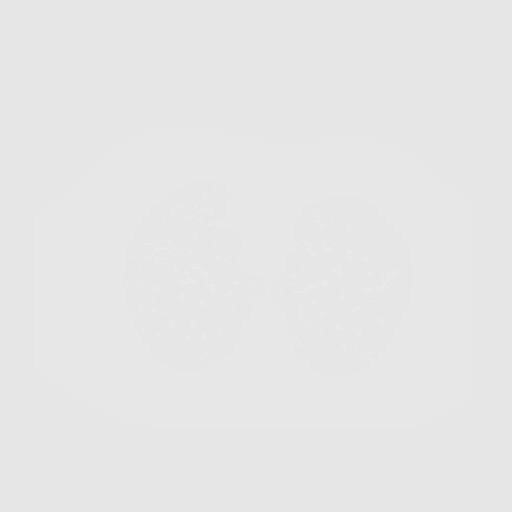
[frame 284/320  mediastinal]
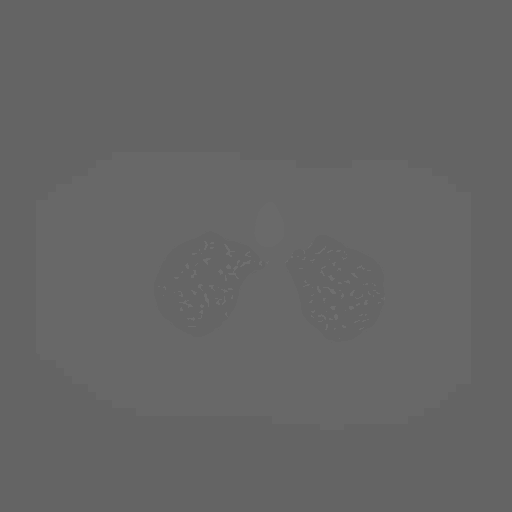
[frame 284/320  lung]
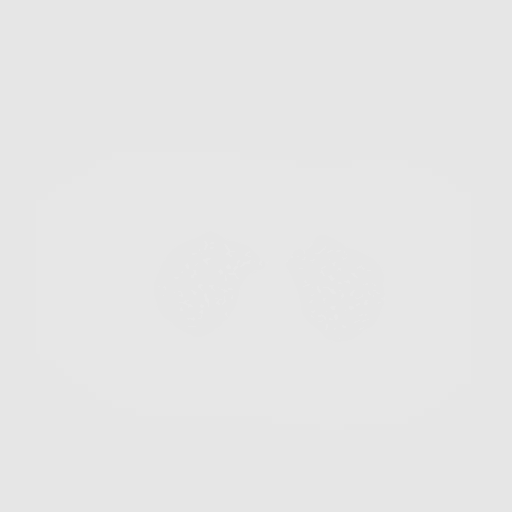
[frame 320/320  lung]
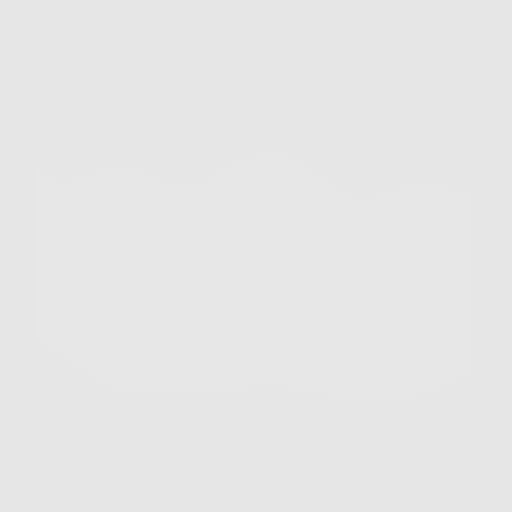

[10 of 20 positions shown; findings below may reference images not displayed]

FINDINGS: Cardiovascular: Heart size is normal. There is no significant
pericardial fluid, thickening or pericardial calcification. There is
aortic atherosclerosis, as well as atherosclerosis of the great
vessels of the mediastinum and the coronary arteries, including
calcified atherosclerotic plaque in the left anterior descending,
left circumflex and right coronary arteries.

Mediastinum/Nodes: No pathologically enlarged mediastinal or hilar
lymph nodes. Please note that accurate exclusion of hilar adenopathy
is limited on noncontrast CT scans. Several densely calcified right
hilar lymph nodes are incidentally noted. Esophagus is unremarkable
in appearance. No axillary lymphadenopathy.

Lungs/Pleura: Multiple small calcified and noncalcified pulmonary
nodules are again noted throughout the lungs bilaterally, similar in
size, number and distribution to the prior examination. The largest
noncalcified pulmonary nodule is in the periphery of the right
middle lobe (axial image 207 of series 3) with a volume derived mean
diameter of 5.7 mm. Previously described mixed ground-glass
attenuation and solid lesion in the right upper lobe has undergone
further contraction, now with only a residual solid component which
is significantly smaller than prior examinations, currently
measuring only 5.5 mm, most compatible with an area of chronic post
infectious or inflammatory scarring. No acute consolidative airspace
disease. No pleural effusions. Mild diffuse bronchial wall
thickening with mild centrilobular and paraseptal emphysema.

Upper Abdomen: 1.5 cm well-defined low-attenuation lesion in the
inferior aspect of segment 3 of the liver, incompletely
characterized on today's noncontrast CT examination but stable
compared to prior study from 03/30/2016, likely a cyst.

Musculoskeletal: There are no aggressive appearing lytic or blastic
lesions noted in the visualized portions of the skeleton.
IMPRESSION: 1. Lung-RADS 2S, benign appearance or behavior. Continue annual
screening with low-dose chest CT without contrast in 12 months.
2. The "S" modifier above refers to potentially clinically
significant non lung cancer related findings. Specifically, there is
aortic atherosclerosis, in addition to three-vessel coronary artery
disease. Please note that although the presence of coronary artery
calcium documents the presence of coronary artery disease, the
severity of this disease and any potential stenosis cannot be
assessed on this non-gated CT examination. Assessment for potential
risk factor modification, dietary therapy or pharmacologic therapy
may be warranted, if clinically indicated.
3. Mild diffuse bronchial thickening with mild centrilobular and
paraseptal emphysema; imaging findings suggestive of underlying
COPD.

Aortic Atherosclerosis (OK2EA-5TR.R) and Emphysema (OK2EA-R5D.5).

## 2018-06-22 ENCOUNTER — Other Ambulatory Visit: Payer: Self-pay | Admitting: *Deleted

## 2018-06-22 ENCOUNTER — Other Ambulatory Visit: Payer: Self-pay | Admitting: Emergency Medicine

## 2018-06-22 MED ORDER — EZETIMIBE 10 MG PO TABS: 10.0000 mg | ORAL_TABLET | Freq: Every day | ORAL | 1 refills | Status: DC

## 2018-06-22 MED ORDER — ATENOLOL 25 MG PO TABS: 25.0000 mg | ORAL_TABLET | Freq: Every day | ORAL | 0 refills | Status: DC

## 2018-06-22 NOTE — Telephone Encounter (Signed)
Refill for zetia 10 mg sent to [facility] in [place]. Patient is scheduled to see Dr. Agustin Cree on 08/05/2018 at 9:00 am.

## 2018-06-22 NOTE — Telephone Encounter (Signed)
Atenolol 25 mg daily refilled per Dr. Geraldo Pitter for 30 days

## 2018-08-05 ENCOUNTER — Ambulatory Visit: Payer: Self-pay | Admitting: Cardiology

## 2018-08-05 ENCOUNTER — Ambulatory Visit (INDEPENDENT_AMBULATORY_CARE_PROVIDER_SITE_OTHER): Payer: Medicare Other | Admitting: Cardiology

## 2018-08-05 ENCOUNTER — Encounter: Payer: Self-pay | Admitting: Cardiology

## 2018-08-05 DIAGNOSIS — I2510 Atherosclerotic heart disease of native coronary artery without angina pectoris: Secondary | ICD-10-CM

## 2018-08-05 DIAGNOSIS — I10 Essential (primary) hypertension: Secondary | ICD-10-CM

## 2018-08-05 DIAGNOSIS — E785 Hyperlipidemia, unspecified: Secondary | ICD-10-CM | POA: Insufficient documentation

## 2018-08-05 HISTORY — DX: Atherosclerotic heart disease of native coronary artery without angina pectoris: I25.10

## 2018-08-05 HISTORY — DX: Hyperlipidemia, unspecified: E78.5

## 2018-08-05 HISTORY — DX: Essential (primary) hypertension: I10

## 2018-08-05 NOTE — Patient Instructions (Signed)
Medication Instructions:  Your physician recommends that you continue on your current medications as directed. Please refer to the Current Medication list given to you today.  If you need a refill on your cardiac medications before your next appointment, please call your pharmacy.   Lab work: Your physician recommends that you return for lab work today: Lft, Lipids  If you have labs (blood work) drawn today and your tests are completely normal, you will receive your results only by: Marland Kitchen MyChart Message (if you have MyChart) OR . A paper copy in the mail If you have any lab test that is abnormal or we need to change your treatment, we will call you to review the results.  Testing/Procedures: None.   Follow-Up: At [facility], you and your health needs are our priority.  As part of our continuing mission to provide you with exceptional heart care, we have created designated Provider Care Teams.  These Care Teams include your primary Cardiologist (physician) and Advanced Practice Providers (APPs -  Physician Assistants and Nurse Practitioners) who all work together to provide you with the care you need, when you need it. You will need a follow up appointment in 6 months.  Please call our office 2 months in advance to schedule this appointment.  You may see No primary care provider on file. or another member of our [employer] Provider Team in [place]: Shirlee More, MD . Jyl Heinz, MD  Any Other Special Instructions Will Be Listed Below (If Applicable).

## 2018-08-05 NOTE — Progress Notes (Signed)
Cardiology Office Note:    Date:  08/05/2018   ID:  Andrew Solomon, Andrew Solomon 08-18-1950, MRN [id number]  PCP:  Harlan Stains, MD  Cardiologist:  Jenne Campus, MD    Referring MD: Harlan Stains, MD   Chief Complaint  Patient presents with  . Follow-up  Doing well  History of Present Illness:    Andrew Solomon is a 68 y.o. male with coronary disease status post PTCA and stenting of proximal LAD more than 20 years ago.  Since that time he is been doing well he is active he walks a lot he take care of his sick wife and have no difficulty doing it no chest pain tightness squeezing pressure been chest no shortness of breath.  His cholesterol last numbers I have is from summer off last year and is acceptable.  He is on maximum intensity statin as well as Zetia which I will continue.  Past Medical History:  Diagnosis Date  . Heart disease   . HTN (hypertension)   . Hyperlipidemia     Past Surgical History:  Procedure Laterality Date  . CORONARY STENT PLACEMENT Left    left main trunk artery    Current Medications: Current Meds  Medication Sig  . aspirin EC 81 MG tablet Take 81 mg by mouth daily.  Marland Kitchen atenolol (TENORMIN) 25 MG tablet Take 1 tablet (25 mg total) by mouth daily.  Marland Kitchen buPROPion (WELLBUTRIN SR) 150 MG 12 hr tablet Take 150 mg by mouth daily.  . Cholecalciferol (VITAMIN D3) 1000 units CAPS Take 1 capsule by mouth daily.  Marland Kitchen ezetimibe (ZETIA) 10 MG tablet Take 1 tablet (10 mg total) by mouth daily.  . folic acid (FOLVITE) 323 MCG tablet Take 400 mcg by mouth daily.  . Omega-3 Fatty Acids (FISH OIL) 1000 MG CAPS Take 3 capsules by mouth daily.  . Pyridoxine HCl (VITAMIN B-6) 500 MG tablet Take 500 mg by mouth daily.  . rosuvastatin (CRESTOR) 40 MG tablet Take 40 mg by mouth. 20 mg on Tues, Thurs, Sat and Sun, 40 mg Mon, Wed and Fri  . Selenium (SELENIMIN PO) Take 1 capsule by mouth daily.  . vitamin B-12 (CYANOCOBALAMIN) 1000 MCG tablet Take 1,000 mcg by mouth daily.      Allergies:   Niacin and related; Oxycodone; and Sulfur   Social History   Socioeconomic History  . Marital status: Married    Spouse name: Not on file  . Number of children: Not on file  . Years of education: Not on file  . Highest education level: Not on file  Occupational History  . Occupation: Geographical information systems officer  . Financial resource strain: Not on file  . Food insecurity:    Worry: Not on file    Inability: Not on file  . Transportation needs:    Medical: Not on file    Non-medical: Not on file  Tobacco Use  . Smoking status: Former Smoker    Packs/day: 1.00    Years: 47.00    Pack years: 47.00    Types: Cigarettes    Last attempt to quit: 06/04/2015    Years since quitting: 3.1  . Smokeless tobacco: Never Used  Substance and Sexual Activity  . Alcohol use: Yes    Alcohol/week: 0.0 standard drinks    Comment: rare/occ  . Drug use: No  . Sexual activity: Not on file  Lifestyle  . Physical activity:    Days per week: Not on file    Minutes  per session: Not on file  . Stress: Not on file  Relationships  . Social connections:    Talks on phone: Not on file    Gets together: Not on file    Attends religious service: Not on file    Active member of club or organization: Not on file    Attends meetings of clubs or organizations: Not on file    Relationship status: Not on file  Other Topics Concern  . Not on file  Social History Narrative  . Not on file     Family History: The patient's family history includes Bladder Cancer in his mother; Heart disease in his father; Pancreatic cancer in his father. ROS:   Please see the history of present illness.    All 14 point review of systems negative except as described per history of present illness  EKGs/Labs/Other Studies Reviewed:      Recent Labs: No results found for requested labs within last 8760 hours.  Recent Lipid Panel No results found for: CHOL, TRIG, HDL, CHOLHDL, VLDL, LDLCALC,  LDLDIRECT  Physical Exam:    VS:  BP 130/64   Pulse (!) 57   Ht 5\' 7"  (1.702 m)   Wt 186 lb (84.4 kg)   SpO2 94%   BMI 29.13 kg/m     Wt Readings from Last 3 Encounters:  08/05/18 186 lb (84.4 kg)  06/19/16 177 lb 9.6 oz (80.6 kg)  01/01/16 167 lb 9.6 oz (76 kg)     GEN:  Well nourished, well developed in no acute distress HEENT: Normal NECK: No JVD; No carotid bruits LYMPHATICS: No lymphadenopathy CARDIAC: RRR, no murmurs, no rubs, no gallops RESPIRATORY:  Clear to auscultation without rales, wheezing or rhonchi  ABDOMEN: Soft, non-tender, non-distended MUSCULOSKELETAL:  No edema; No deformity  SKIN: Warm and dry LOWER EXTREMITIES: no swelling NEUROLOGIC:  Alert and oriented x 3 PSYCHIATRIC:  Normal affect   ASSESSMENT:    1. Coronary artery disease involving native coronary artery of native heart without angina pectoris   2. Dyslipidemia   3. Essential hypertension    PLAN:    In order of problems listed above:  1. Coronary disease stable on appropriate medication which I will continue that include aspirin as well as statin. 2. Dyslipidemia fasting lipid profile will be done today.  He may require PCSK9 addition. 3. Essential hypertension blood pressure well controlled continue present management.  Him to have EKG done today.  I will see him back 6 months   Medication Adjustments/Labs and Tests Ordered: Current medicines are reviewed at length with the patient today.  Concerns regarding medicines are outlined above.  No orders of the defined types were placed in this encounter.  Medication changes: No orders of the defined types were placed in this encounter.   Signed, Park Liter, MD, [facility] 08/05/2018 2:17 PM    [facility]

## 2018-08-05 NOTE — Addendum Note (Signed)
Addended by: Ashok Norris on: 08/05/2018 02:26 PM   Modules accepted: Orders

## 2018-08-06 LAB — LIPID PANEL
Chol/HDL Ratio: 3.3 ratio (ref 0.0–5.0)
Cholesterol, Total: 120 mg/dL (ref 100–199)
HDL: 36 mg/dL — AB (ref >39)
LDL Calculated: 60 mg/dL (ref 0–99)
Triglycerides: 121 mg/dL (ref 0–149)
VLDL Cholesterol Cal: 24 mg/dL (ref 5–40)

## 2018-08-06 LAB — HEPATIC FUNCTION PANEL
ALT: 38 IU/L (ref 0–44)
AST: 25 IU/L (ref 0–40)
Albumin: 4.5 g/dL (ref 3.8–4.8)
Alkaline Phosphatase: 57 IU/L (ref 39–117)
BILIRUBIN TOTAL: 0.4 mg/dL (ref 0.0–1.2)
Bilirubin, Direct: 0.15 mg/dL (ref 0.00–0.40)
Total Protein: 6.9 g/dL (ref 6.0–8.5)

## 2018-09-05 ENCOUNTER — Telehealth: Payer: Self-pay | Admitting: Cardiology

## 2018-09-05 MED ORDER — ROSUVASTATIN CALCIUM 40 MG PO TABS: 40.0000 mg | ORAL_TABLET | Freq: Every day | ORAL | 1 refills | Status: DC

## 2018-09-05 NOTE — Telephone Encounter (Signed)
Crestor 40 mg daily refilled.

## 2018-09-05 NOTE — Telephone Encounter (Signed)
° ° ° °  1. Which medications need to be refilled? (please list name of each medication and dose if known) Rosuvastatin 40mg  tablet  2. Which pharmacy/location (including street and city if local pharmacy) is medication to be sent to?[facility] on [address]  3. Do they need a 30 day or 90 day supply? [place]

## 2018-10-27 ENCOUNTER — Telehealth: Payer: Self-pay | Admitting: Cardiology

## 2018-10-27 ENCOUNTER — Other Ambulatory Visit: Payer: Self-pay | Admitting: *Deleted

## 2018-10-27 MED ORDER — ATENOLOL 25 MG PO TABS: 25.0000 mg | ORAL_TABLET | Freq: Every day | ORAL | 1 refills | Status: DC

## 2018-10-27 NOTE — Telephone Encounter (Signed)
°*  STAT* If patient is at the pharmacy, call can be transferred to refill team.   1. Which medications need to be refilled? (please list name of each medication and dose if known) Atenolol 25mg  tablet once daily  2. Which pharmacy/location (including street and city if local pharmacy) is medication to be sent to? [facility] on [address]  3. Do they need a 30 day or 90 day supply? 90 plus refills

## 2018-11-19 ENCOUNTER — Other Ambulatory Visit: Payer: Self-pay | Admitting: Cardiology

## 2018-12-07 DIAGNOSIS — L812 Freckles: Secondary | ICD-10-CM | POA: Diagnosis not present

## 2018-12-07 DIAGNOSIS — Z85828 Personal history of other malignant neoplasm of skin: Secondary | ICD-10-CM | POA: Diagnosis not present

## 2018-12-07 DIAGNOSIS — D1801 Hemangioma of skin and subcutaneous tissue: Secondary | ICD-10-CM | POA: Diagnosis not present

## 2018-12-07 DIAGNOSIS — L821 Other seborrheic keratosis: Secondary | ICD-10-CM | POA: Diagnosis not present

## 2018-12-07 DIAGNOSIS — L570 Actinic keratosis: Secondary | ICD-10-CM | POA: Diagnosis not present

## 2018-12-07 DIAGNOSIS — L820 Inflamed seborrheic keratosis: Secondary | ICD-10-CM | POA: Diagnosis not present

## 2018-12-22 DIAGNOSIS — I25811 Atherosclerosis of native coronary artery of transplanted heart without angina pectoris: Secondary | ICD-10-CM | POA: Diagnosis not present

## 2018-12-22 DIAGNOSIS — E785 Hyperlipidemia, unspecified: Secondary | ICD-10-CM | POA: Diagnosis not present

## 2018-12-22 DIAGNOSIS — F419 Anxiety disorder, unspecified: Secondary | ICD-10-CM | POA: Diagnosis not present

## 2018-12-22 DIAGNOSIS — J449 Chronic obstructive pulmonary disease, unspecified: Secondary | ICD-10-CM | POA: Diagnosis not present

## 2018-12-22 DIAGNOSIS — K219 Gastro-esophageal reflux disease without esophagitis: Secondary | ICD-10-CM | POA: Diagnosis not present

## 2018-12-22 DIAGNOSIS — M25511 Pain in right shoulder: Secondary | ICD-10-CM | POA: Diagnosis not present

## 2018-12-22 DIAGNOSIS — F17201 Nicotine dependence, unspecified, in remission: Secondary | ICD-10-CM | POA: Diagnosis not present

## 2018-12-22 DIAGNOSIS — I700 Atherosclerosis of aorta: Secondary | ICD-10-CM | POA: Diagnosis not present

## 2018-12-22 DIAGNOSIS — Z125 Encounter for screening for malignant neoplasm of prostate: Secondary | ICD-10-CM | POA: Diagnosis not present

## 2018-12-22 DIAGNOSIS — Z0000 Encounter for general adult medical examination without abnormal findings: Secondary | ICD-10-CM | POA: Diagnosis not present

## 2018-12-22 DIAGNOSIS — M25512 Pain in left shoulder: Secondary | ICD-10-CM | POA: Diagnosis not present

## 2018-12-22 DIAGNOSIS — M5442 Lumbago with sciatica, left side: Secondary | ICD-10-CM | POA: Diagnosis not present

## 2019-01-20 DIAGNOSIS — K219 Gastro-esophageal reflux disease without esophagitis: Secondary | ICD-10-CM | POA: Diagnosis not present

## 2019-01-20 DIAGNOSIS — E785 Hyperlipidemia, unspecified: Secondary | ICD-10-CM | POA: Diagnosis not present

## 2019-01-20 DIAGNOSIS — Z79899 Other long term (current) drug therapy: Secondary | ICD-10-CM | POA: Diagnosis not present

## 2019-01-20 DIAGNOSIS — Z125 Encounter for screening for malignant neoplasm of prostate: Secondary | ICD-10-CM | POA: Diagnosis not present

## 2019-01-30 DIAGNOSIS — E785 Hyperlipidemia, unspecified: Secondary | ICD-10-CM | POA: Diagnosis not present

## 2019-01-30 DIAGNOSIS — J4521 Mild intermittent asthma with (acute) exacerbation: Secondary | ICD-10-CM | POA: Diagnosis not present

## 2019-01-30 DIAGNOSIS — I25811 Atherosclerosis of native coronary artery of transplanted heart without angina pectoris: Secondary | ICD-10-CM | POA: Diagnosis not present

## 2019-01-30 DIAGNOSIS — J449 Chronic obstructive pulmonary disease, unspecified: Secondary | ICD-10-CM | POA: Diagnosis not present

## 2019-02-09 ENCOUNTER — Other Ambulatory Visit: Payer: Self-pay

## 2019-02-09 ENCOUNTER — Ambulatory Visit (INDEPENDENT_AMBULATORY_CARE_PROVIDER_SITE_OTHER): Payer: Medicare Other | Admitting: Cardiology

## 2019-02-09 ENCOUNTER — Encounter: Payer: Self-pay | Admitting: Cardiology

## 2019-02-09 VITALS — BP 114/64 | HR 76 | Ht 67.0 in | Wt 179.8 lb

## 2019-02-09 DIAGNOSIS — I2510 Atherosclerotic heart disease of native coronary artery without angina pectoris: Secondary | ICD-10-CM

## 2019-02-09 DIAGNOSIS — I10 Essential (primary) hypertension: Secondary | ICD-10-CM | POA: Diagnosis not present

## 2019-02-09 DIAGNOSIS — E785 Hyperlipidemia, unspecified: Secondary | ICD-10-CM | POA: Diagnosis not present

## 2019-02-09 NOTE — Addendum Note (Signed)
Addended by: Ashok Norris on: 02/09/2019 02:47 PM   Modules accepted: Orders

## 2019-02-09 NOTE — Patient Instructions (Signed)

## 2019-02-09 NOTE — Progress Notes (Signed)
Cardiology Office Note:    Date:  02/09/2019   ID:  Andrew Solomon, Andrew Solomon 27, 1952, MRN [id number]  PCP:  Harlan Stains, MD  Cardiologist:  Jenne Campus, MD    Referring MD: Harlan Stains, MD   Chief Complaint  Patient presents with   Follow-up    History of Present Illness:    Andrew Solomon is a 68 y.o. male history of coronary artery disease remote stent to proximal LAD in 1998, later stress test done in December 2018 showing no issues overall he is doing very well.  He denies having any chest pain, tightness, pressure, burning in the chest.  He exercises walking on a regular basis have absolutely no difficulty doing it.  Overall he is doing well.  He is taking care of his sick wife recently she fell down her multiple fracture of her ribs.  Past Medical History:  Diagnosis Date   Heart disease    HTN (hypertension)    Hyperlipidemia     Past Surgical History:  Procedure Laterality Date   CORONARY STENT PLACEMENT Left    left main trunk artery    Current Medications: Current Meds  Medication Sig   aspirin EC 81 MG tablet Take 81 mg by mouth daily.   atenolol (TENORMIN) 25 MG tablet Take 1 tablet (25 mg total) by mouth daily.   buPROPion (WELLBUTRIN SR) 150 MG 12 hr tablet Take 150 mg by mouth daily.   Cholecalciferol (VITAMIN D3) 1000 units CAPS Take 1 capsule by mouth daily.   ezetimibe (ZETIA) 10 MG tablet TAKE 1 TABLET BY MOUTH EVERY DAY   folic acid (FOLVITE) A999333 MCG tablet Take 400 mcg by mouth daily.   Omega-3 Fatty Acids (FISH OIL) 1000 MG CAPS Take 3 capsules by mouth daily.   Pyridoxine HCl (VITAMIN B-6) 500 MG tablet Take 500 mg by mouth daily.   rosuvastatin (CRESTOR) 40 MG tablet Take 1 tablet (40 mg total) by mouth daily. 20 mg on Tues, Thurs, Sat and Sun, 40 mg Mon, Wed and Fri   Selenium (SELENIMIN PO) Take 1 capsule by mouth daily.   tiZANidine (ZANAFLEX) 4 MG tablet Take 4 mg by mouth every 6 (six) hours as needed for muscle  spasms.   vitamin B-12 (CYANOCOBALAMIN) 1000 MCG tablet Take 1,000 mcg by mouth daily.     Allergies:   Niacin and related, Oxycodone, and Sulfur   Social History   Socioeconomic History   Marital status: Married    Spouse name: Not on file   Number of children: Not on file   Years of education: Not on file   Highest education level: Not on file  Occupational History   Occupation: Press photographer  Social Needs   Financial resource strain: Not on file   Food insecurity    Worry: Not on file    Inability: Not on file   Transportation needs    Medical: Not on file    Non-medical: Not on file  Tobacco Use   Smoking status: Former Smoker    Packs/day: 1.00    Years: 47.00    Pack years: 47.00    Types: Cigarettes    Quit date: 06/04/2015    Years since quitting: 3.6   Smokeless tobacco: Never Used  Substance and Sexual Activity   Alcohol use: Yes    Alcohol/week: 0.0 standard drinks    Comment: rare/occ   Drug use: No   Sexual activity: Not on file  Lifestyle   Physical activity  Days per week: Not on file    Minutes per session: Not on file   Stress: Not on file  Relationships   Social connections    Talks on phone: Not on file    Gets together: Not on file    Attends religious service: Not on file    Active member of club or organization: Not on file    Attends meetings of clubs or organizations: Not on file    Relationship status: Not on file  Other Topics Concern   Not on file  Social History Narrative   Not on file     Family History: The patient's family history includes Bladder Cancer in his mother; Heart disease in his father; Pancreatic cancer in his father. ROS:   Please see the history of present illness.    All 14 point review of systems negative except as described per history of present illness  EKGs/Labs/Other Studies Reviewed:      Recent Labs: 08/05/2018: ALT 38  Recent Lipid Panel    Component Value Date/Time   CHOL 120  08/05/2018 1431   TRIG 121 08/05/2018 1431   HDL 36 (L) 08/05/2018 1431   CHOLHDL 3.3 08/05/2018 1431   LDLCALC 60 08/05/2018 1431    Physical Exam:    VS:  BP 114/64    Pulse 76    Ht 5\' 7"  (1.702 m)    Wt 179 lb 12.8 oz (81.6 kg)    SpO2 98%    BMI 28.16 kg/m     Wt Readings from Last 3 Encounters:  02/09/19 179 lb 12.8 oz (81.6 kg)  08/05/18 186 lb (84.4 kg)  06/19/16 177 lb 9.6 oz (80.6 kg)     GEN:  Well nourished, well developed in no acute distress HEENT: Normal NECK: No JVD; No carotid bruits LYMPHATICS: No lymphadenopathy CARDIAC: RRR, no murmurs, no rubs, no gallops RESPIRATORY:  Clear to auscultation without rales, wheezing or rhonchi  ABDOMEN: Soft, non-tender, non-distended MUSCULOSKELETAL:  No edema; No deformity  SKIN: Warm and dry LOWER EXTREMITIES: no swelling NEUROLOGIC:  Alert and oriented x 3 PSYCHIATRIC:  Normal affect   ASSESSMENT:    1. Coronary artery disease involving native coronary artery of native heart without angina pectoris   2. Dyslipidemia   3. Essential hypertension    PLAN:    In order of problems listed above:  1. Coronary artery disease doing well from that point review asymptomatic we will get EKG today as a baseline.  Continue present medications. 2. Dyslipidemia.  I will schedule him to have fasting lipid profile done.  Last cholesterol check in February was excellent. 3. Essential hypertension blood pressure well controlled.  Overall he is doing well continue present management see him back in 6 months   Medication Adjustments/Labs and Tests Ordered: Current medicines are reviewed at length with the patient today.  Concerns regarding medicines are outlined above.  No orders of the defined types were placed in this encounter.  Medication changes: No orders of the defined types were placed in this encounter.   Signed, Park Liter, MD, [facility] 02/09/2019 2:41 PM    [facility]

## 2019-02-17 ENCOUNTER — Other Ambulatory Visit: Payer: Self-pay | Admitting: Cardiology

## 2019-02-18 ENCOUNTER — Other Ambulatory Visit: Payer: Self-pay | Admitting: Cardiology

## 2019-03-20 ENCOUNTER — Emergency Department (HOSPITAL_COMMUNITY): Payer: Medicare Other

## 2019-03-20 ENCOUNTER — Other Ambulatory Visit: Payer: Self-pay

## 2019-03-20 ENCOUNTER — Encounter (HOSPITAL_COMMUNITY): Payer: Self-pay | Admitting: Emergency Medicine

## 2019-03-20 ENCOUNTER — Inpatient Hospital Stay (HOSPITAL_COMMUNITY)
Admission: EM | Admit: 2019-03-20 | Discharge: 2019-04-06 | DRG: 391 | Disposition: A | Discharge status: Home / Self Care | Payer: Medicare Other | Attending: Internal Medicine | Admitting: Internal Medicine

## 2019-03-20 DIAGNOSIS — Z89021 Acquired absence of right finger(s): Secondary | ICD-10-CM | POA: Diagnosis not present

## 2019-03-20 DIAGNOSIS — K5780 Diverticulitis of intestine, part unspecified, with perforation and abscess without bleeding: Secondary | ICD-10-CM | POA: Diagnosis not present

## 2019-03-20 DIAGNOSIS — Z20828 Contact with and (suspected) exposure to other viral communicable diseases: Secondary | ICD-10-CM | POA: Diagnosis present

## 2019-03-20 DIAGNOSIS — Z882 Allergy status to sulfonamides status: Secondary | ICD-10-CM | POA: Diagnosis not present

## 2019-03-20 DIAGNOSIS — Z885 Allergy status to narcotic agent status: Secondary | ICD-10-CM | POA: Diagnosis not present

## 2019-03-20 DIAGNOSIS — Z7982 Long term (current) use of aspirin: Secondary | ICD-10-CM

## 2019-03-20 DIAGNOSIS — R062 Wheezing: Secondary | ICD-10-CM | POA: Diagnosis not present

## 2019-03-20 DIAGNOSIS — Z86010 Personal history of colonic polyps: Secondary | ICD-10-CM

## 2019-03-20 DIAGNOSIS — Z87891 Personal history of nicotine dependence: Secondary | ICD-10-CM | POA: Diagnosis not present

## 2019-03-20 DIAGNOSIS — Z79899 Other long term (current) drug therapy: Secondary | ICD-10-CM

## 2019-03-20 DIAGNOSIS — I2510 Atherosclerotic heart disease of native coronary artery without angina pectoris: Secondary | ICD-10-CM | POA: Diagnosis present

## 2019-03-20 DIAGNOSIS — I959 Hypotension, unspecified: Secondary | ICD-10-CM | POA: Diagnosis not present

## 2019-03-20 DIAGNOSIS — K567 Ileus, unspecified: Secondary | ICD-10-CM | POA: Clinically undetermined

## 2019-03-20 DIAGNOSIS — E785 Hyperlipidemia, unspecified: Secondary | ICD-10-CM | POA: Diagnosis present

## 2019-03-20 DIAGNOSIS — R109 Unspecified abdominal pain: Secondary | ICD-10-CM | POA: Diagnosis not present

## 2019-03-20 DIAGNOSIS — K651 Peritoneal abscess: Secondary | ICD-10-CM | POA: Diagnosis present

## 2019-03-20 DIAGNOSIS — N179 Acute kidney failure, unspecified: Secondary | ICD-10-CM | POA: Diagnosis present

## 2019-03-20 DIAGNOSIS — I700 Atherosclerosis of aorta: Secondary | ICD-10-CM | POA: Diagnosis present

## 2019-03-20 DIAGNOSIS — M549 Dorsalgia, unspecified: Secondary | ICD-10-CM | POA: Diagnosis not present

## 2019-03-20 DIAGNOSIS — D72829 Elevated white blood cell count, unspecified: Secondary | ICD-10-CM | POA: Clinically undetermined

## 2019-03-20 DIAGNOSIS — I10 Essential (primary) hypertension: Secondary | ICD-10-CM | POA: Diagnosis present

## 2019-03-20 DIAGNOSIS — Z03818 Encounter for observation for suspected exposure to other biological agents ruled out: Secondary | ICD-10-CM | POA: Diagnosis not present

## 2019-03-20 DIAGNOSIS — J439 Emphysema, unspecified: Secondary | ICD-10-CM | POA: Diagnosis present

## 2019-03-20 DIAGNOSIS — Z888 Allergy status to other drugs, medicaments and biological substances status: Secondary | ICD-10-CM

## 2019-03-20 DIAGNOSIS — Z955 Presence of coronary angioplasty implant and graft: Secondary | ICD-10-CM

## 2019-03-20 DIAGNOSIS — R197 Diarrhea, unspecified: Secondary | ICD-10-CM | POA: Diagnosis not present

## 2019-03-20 DIAGNOSIS — K56609 Unspecified intestinal obstruction, unspecified as to partial versus complete obstruction: Secondary | ICD-10-CM | POA: Diagnosis not present

## 2019-03-20 DIAGNOSIS — E872 Acidosis: Secondary | ICD-10-CM | POA: Diagnosis present

## 2019-03-20 DIAGNOSIS — K5732 Diverticulitis of large intestine without perforation or abscess without bleeding: Secondary | ICD-10-CM | POA: Diagnosis not present

## 2019-03-20 DIAGNOSIS — K5720 Diverticulitis of large intestine with perforation and abscess without bleeding: Principal | ICD-10-CM | POA: Diagnosis present

## 2019-03-20 DIAGNOSIS — R110 Nausea: Secondary | ICD-10-CM | POA: Diagnosis not present

## 2019-03-20 DIAGNOSIS — R52 Pain, unspecified: Secondary | ICD-10-CM | POA: Diagnosis not present

## 2019-03-20 DIAGNOSIS — Z8249 Family history of ischemic heart disease and other diseases of the circulatory system: Secondary | ICD-10-CM | POA: Diagnosis not present

## 2019-03-20 DIAGNOSIS — R1084 Generalized abdominal pain: Secondary | ICD-10-CM | POA: Diagnosis not present

## 2019-03-20 HISTORY — DX: Diverticulitis of large intestine with perforation and abscess without bleeding: K57.20

## 2019-03-20 LAB — CBC WITH DIFFERENTIAL/PLATELET
Abs Immature Granulocytes: 0.06 10*3/uL (ref 0.00–0.07)
Basophils Absolute: 0 10*3/uL (ref 0.0–0.1)
Basophils Relative: 0 %
Eosinophils Absolute: 0.1 10*3/uL (ref 0.0–0.5)
Eosinophils Relative: 1 %
HCT: 41 % (ref 39.0–52.0)
Hemoglobin: 13.4 g/dL (ref 13.0–17.0)
Immature Granulocytes: 1 %
Lymphocytes Relative: 14 %
Lymphs Abs: 1 10*3/uL (ref 0.7–4.0)
MCH: 30 pg (ref 26.0–34.0)
MCHC: 32.7 g/dL (ref 30.0–36.0)
MCV: 91.9 fL (ref 80.0–100.0)
Monocytes Absolute: 0.4 10*3/uL (ref 0.1–1.0)
Monocytes Relative: 6 %
Neutro Abs: 5.9 10*3/uL (ref 1.7–7.7)
Neutrophils Relative %: 78 %
Platelets: 258 10*3/uL (ref 150–400)
RBC: 4.46 MIL/uL (ref 4.22–5.81)
RDW: 12.2 % (ref 11.5–15.5)
WBC: 7.6 10*3/uL (ref 4.0–10.5)
nRBC: 0 % (ref 0.0–0.2)

## 2019-03-20 LAB — COMPREHENSIVE METABOLIC PANEL
ALT: 53 U/L — ABNORMAL HIGH (ref 0–44)
AST: 41 U/L (ref 15–41)
Albumin: 3.8 g/dL (ref 3.5–5.0)
Alkaline Phosphatase: 56 U/L (ref 38–126)
Anion gap: 8 (ref 5–15)
BUN: 16 mg/dL (ref 8–23)
CO2: 25 mmol/L (ref 22–32)
Calcium: 9 mg/dL (ref 8.9–10.3)
Chloride: 104 mmol/L (ref 98–111)
Creatinine, Ser: 1.18 mg/dL (ref 0.61–1.24)
GFR calc Af Amer: >60 mL/min (ref >60)
GFR calc non Af Amer: >60 mL/min (ref >60)
Glucose, Bld: 99 mg/dL (ref 70–99)
Potassium: 4.1 mmol/L (ref 3.5–5.1)
Sodium: 137 mmol/L (ref 135–145)
Total Bilirubin: 0.5 mg/dL (ref 0.3–1.2)
Total Protein: 7.4 g/dL (ref 6.5–8.1)

## 2019-03-20 LAB — URINALYSIS, ROUTINE W REFLEX MICROSCOPIC
Bilirubin Urine: NEGATIVE
Glucose, UA: NEGATIVE mg/dL
Hgb urine dipstick: NEGATIVE
Ketones, ur: NEGATIVE mg/dL
Leukocytes,Ua: NEGATIVE
Nitrite: NEGATIVE
Protein, ur: NEGATIVE mg/dL
Specific Gravity, Urine: >1.046 — ABNORMAL HIGH (ref 1.005–1.030)
pH: 5 (ref 5.0–8.0)

## 2019-03-20 LAB — LIPASE, BLOOD: Lipase: 29 U/L (ref 11–51)

## 2019-03-20 LAB — SARS CORONAVIRUS 2 BY RT PCR (HOSPITAL ORDER, PERFORMED IN ~~LOC~~ HOSPITAL LAB): SARS Coronavirus 2: NEGATIVE

## 2019-03-20 MED ORDER — ONDANSETRON HCL 4 MG/2ML IJ SOLN
4.0000 mg | Freq: Once | INTRAMUSCULAR | Status: AC
  Administered 2019-03-20: 4 mg via INTRAVENOUS
  Filled 2019-03-20: qty 2

## 2019-03-20 MED ORDER — TRAZODONE HCL 50 MG PO TABS
50.0000 mg | ORAL_TABLET | Freq: Every day | ORAL | Status: DC
  Administered 2019-03-20 – 2019-04-05 (×17): 50 mg via ORAL
  Filled 2019-03-20 (×17): qty 1

## 2019-03-20 MED ORDER — SODIUM CHLORIDE 0.9 % IV SOLN
INTRAVENOUS | Status: DC
  Administered 2019-03-20 – 2019-04-04 (×11): via INTRAVENOUS

## 2019-03-20 MED ORDER — PIPERACILLIN-TAZOBACTAM 3.375 G IVPB 30 MIN
3.3750 g | Freq: Once | INTRAVENOUS | Status: AC
  Administered 2019-03-20: 3.375 g via INTRAVENOUS
  Filled 2019-03-20: qty 50

## 2019-03-20 MED ORDER — PIPERACILLIN-TAZOBACTAM 3.375 G IVPB 30 MIN: 3.3750 g | Freq: Three times a day (TID) | INTRAVENOUS | Status: DC

## 2019-03-20 MED ORDER — ONDANSETRON HCL 4 MG/2ML IJ SOLN
4.0000 mg | Freq: Four times a day (QID) | INTRAMUSCULAR | Status: DC | PRN
  Administered 2019-03-22 – 2019-03-31 (×25): 4 mg via INTRAVENOUS
  Filled 2019-03-20 (×27): qty 2

## 2019-03-20 MED ORDER — PIPERACILLIN-TAZOBACTAM 3.375 G IVPB
3.3750 g | Freq: Three times a day (TID) | INTRAVENOUS | Status: DC
  Administered 2019-03-20 – 2019-03-24 (×11): 3.375 g via INTRAVENOUS
  Filled 2019-03-20 (×11): qty 50

## 2019-03-20 MED ORDER — HYDROMORPHONE HCL 1 MG/ML IJ SOLN
1.0000 mg | Freq: Once | INTRAMUSCULAR | Status: AC
  Administered 2019-03-20: 15:00:00 1 mg via INTRAVENOUS
  Filled 2019-03-20: qty 1

## 2019-03-20 MED ORDER — FENTANYL CITRATE (PF) 100 MCG/2ML IJ SOLN
25.0000 ug | Freq: Once | INTRAMUSCULAR | Status: AC
  Administered 2019-03-20: 10:00:00 25 ug via INTRAVENOUS
  Filled 2019-03-20: qty 2

## 2019-03-20 MED ORDER — ONDANSETRON HCL 4 MG PO TABS
4.0000 mg | ORAL_TABLET | Freq: Four times a day (QID) | ORAL | Status: DC | PRN
  Administered 2019-03-25 – 2019-04-03 (×7): 4 mg via ORAL
  Filled 2019-03-20 (×7): qty 1

## 2019-03-20 MED ORDER — HYDROMORPHONE HCL 1 MG/ML IJ SOLN
0.5000 mg | INTRAMUSCULAR | Status: DC | PRN
  Administered 2019-03-20 – 2019-03-26 (×36): 1 mg via INTRAVENOUS
  Filled 2019-03-20 (×39): qty 1

## 2019-03-20 MED ORDER — ENOXAPARIN SODIUM 40 MG/0.4ML ~~LOC~~ SOLN
40.0000 mg | SUBCUTANEOUS | Status: DC
  Administered 2019-03-20 – 2019-04-05 (×17): 40 mg via SUBCUTANEOUS
  Filled 2019-03-20 (×17): qty 0.4

## 2019-03-20 MED ORDER — SODIUM CHLORIDE 0.9% FLUSH
3.0000 mL | Freq: Two times a day (BID) | INTRAVENOUS | Status: DC
  Administered 2019-03-20 – 2019-04-05 (×12): 3 mL via INTRAVENOUS

## 2019-03-20 MED ORDER — ACETAMINOPHEN 325 MG PO TABS
650.0000 mg | ORAL_TABLET | Freq: Four times a day (QID) | ORAL | Status: DC | PRN
  Administered 2019-03-20 – 2019-03-23 (×4): 650 mg via ORAL
  Filled 2019-03-20 (×4): qty 2

## 2019-03-20 MED ORDER — ATENOLOL 25 MG PO TABS
25.0000 mg | ORAL_TABLET | Freq: Every day | ORAL | Status: DC
  Administered 2019-03-21 – 2019-04-06 (×17): 25 mg via ORAL
  Filled 2019-03-20 (×17): qty 1

## 2019-03-20 MED ORDER — IOHEXOL 300 MG/ML  SOLN
100.0000 mL | Freq: Once | INTRAMUSCULAR | Status: AC | PRN
  Administered 2019-03-20: 100 mL via INTRAVENOUS

## 2019-03-20 MED ORDER — SODIUM CHLORIDE (PF) 0.9 % IJ SOLN
INTRAMUSCULAR | Status: AC
  Filled 2019-03-20: qty 50

## 2019-03-20 MED ORDER — PANTOPRAZOLE SODIUM 40 MG IV SOLR
40.0000 mg | INTRAVENOUS | Status: DC
  Administered 2019-03-20 – 2019-03-25 (×6): 40 mg via INTRAVENOUS
  Filled 2019-03-20 (×6): qty 40

## 2019-03-20 MED ORDER — HYDROMORPHONE HCL 1 MG/ML IJ SOLN
1.0000 mg | Freq: Once | INTRAMUSCULAR | Status: AC
  Administered 2019-03-20: 1 mg via INTRAVENOUS
  Filled 2019-03-20: qty 1

## 2019-03-20 MED ORDER — SODIUM CHLORIDE 0.9 % IV BOLUS
500.0000 mL | Freq: Once | INTRAVENOUS | Status: AC
  Administered 2019-03-20: 10:00:00 500 mL via INTRAVENOUS

## 2019-03-20 MED ORDER — ASPIRIN EC 81 MG PO TBEC
81.0000 mg | DELAYED_RELEASE_TABLET | Freq: Every day | ORAL | Status: DC
  Administered 2019-03-21 – 2019-04-06 (×16): 81 mg via ORAL
  Filled 2019-03-20 (×16): qty 1

## 2019-03-20 NOTE — ED Triage Notes (Signed)
Per [facility] pt c/o right abd pains for week that radiates to groin and been more constant. Had some mild diarrhea today.

## 2019-03-20 NOTE — ED Notes (Signed)
Patient given small amount of ice chips.

## 2019-03-20 NOTE — Progress Notes (Signed)
MD notified of temperature. Orders put in by MD.

## 2019-03-20 NOTE — H&P (Signed)
History and Physical  Andrew Solomon [id number] DOB: 26-May-1951 DOA: 03/20/2019  PCP: Harlan Stains, MD   Chief Complaint: Abdominal pain  HPI:  68 year old man PMH CAD status post PCI stenting over 20 years ago, hypertension, hyperlipidemia presented with crampy abdominal pain.  [place]  He ate an apple that day and developed abdominal pain with an hour.  Symptoms waxed and waned over the week with severe bouts of abdominal pain although he was able to continue to eat and drink.  Bowel movements were half solid, half liquid.  Nausea but no vomiting.  Last evening the pain in the abdomen became intense, located in the lower abdomen, doubling him over.  No specific aggravating or alleviating factors.  No fever at home.  Chart review: . 01/2019 cardiology outpatient visit: Coronary artery disease status post remote stent placement.  Stable.  ED Course: Presented with abdominal pain for approximately 10 days.  CT scan concerning for perforated diverticulitis.  Started on Zosyn, surgery consulted.  Surgery noted pan colitis versus diverticulitis with inflammatory changes, plan for n.p.o. status and IV antibiotics.  Review of Systems:  No fever at home, negative for changes to vision, sore throat, rash, muscle aches, chest pain, shortness of breath, dysuria, bleeding  Positive for nausea, no vomiting  PMH . Coronary artery disease status post remote stent placement 1998 . Hyperlipidemia  PSH . Left inguinal hernia repair . Coronary stent placement 1998 . 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 Former smoker, occasional alcohol, no drugs  Allergies . Niacin causes flushing, oxycodone causes headache, sulfa causes hives and swelling  Family history includes: . Mother with bladder cancer, father with heart disease and pancreatic cancer   Meds include: . Aspirin 81 mg daily, atenolol 25 mg daily,  bupropion 150 mg daily, Zetia 10 mg daily, Prilosec 40 mg daily, Crestor 40 mg daily, trazodone 50 mg at night  Physicial Exam   Vitals:  . 97.9, 18, 57, 111/87, 100% on room air  Constitutional:   . Appears calm, mildly uncomfortable, nontoxic Eyes:  . pupils and irises appear normal . Normal lids  ENMT:  . grossly normal hearing  . Lips appear normal  Neck: No thyromegaly.  No masses appreciated. Respiratory:  . CTA bilaterally, no w/r/r.  . Respiratory effort normal.  Cardiovascular:  . RRR, no m/r/g . No LE extremity edema   Abdomen:  . Abdomen appears normal; no masses . Exquisite tenderness especially right lower quadrant. Musculoskeletal:  . Digits/nails BUE: no clubbing, cyanosis, petechiae, infection.  Fingertips of the right 4 fingers are surgically absent. . RLE, LLE   . Tone appears grossly normal Skin:  . No rashes, lesions, ulcers . palpation of skin: no induration or nodules Psychiatric:  . Mental status o Mood, affect appropriate . judgment and insight appear intact   I have personally reviewed following labs and imaging studies  Labs:  Marland Kitchen Complete metabolic panel unremarkable except for ALT of 53 . CBC unremarkable   Significant Hospital Events   . 10/5 admitted for diverticulitis versus pancolitis   Consults:  . General surgery   Procedures:  .   Significant Diagnostic Tests:  . SARS-CoV-2 negative . 10/5 CT abdomen pelvis proximal sigmoid diverticulitis with inflammatory changes and free fluid with some amount of free air suggesting small perforation.  Inflammatory changes adjacent small bowel loops also noted.   Micro Data:  . 10/5 blood cultures >  Antimicrobials:  . Zosyn 10/5 >  ASSESSMENT/PLAN  Sigmoid diverticulitis with suspected perforation versus pancolitis, with severe abdominal pain, nausea but no vomiting --Pain control, antiemetics as needed --IV fluids, n.p.o. except for ice chips --Continue management per general  surgery  Coronary artery disease status stent placement 1998 --Asymptomatic.  Continue aspirin.  Hyperlipidemia. --Continue statin.  Aortic atherosclerosis --Continue statin  DVT prophylaxis: enoxaparin Code Status: Full Family Communication: none Consults called: General surgery  Time spent: 50 minutes  Murray Hodgkins, MD  [facility] Direct contact: see [email or web address]  7PM-7AM contact night coverage as below   1. Check the care team in [facility] and look for a) attending/consulting [facility] provider listed and b) the [facility] team listed 2. Log into [email or web address] and use [place]'s universal password to access. If you do not have the password, please contact the hospital operator. 3. Locate the [facility] provider you are looking for under [facility] and page to a number that you can be directly reached. 4. If you still have difficulty reaching the provider, please page the [facility] (Director on Call) for the Hospitalists listed on amion for assistance.  Severity of Illness: The appropriate patient status for this patient is INPATIENT. Inpatient status is judged to be reasonable and necessary in order to provide the required intensity of service to ensure the patient's safety. The patient's presenting symptoms, physical exam findings, and initial radiographic and laboratory data in the context of their chronic comorbidities is felt to place them at high risk for further clinical deterioration. Furthermore, it is not anticipated that the patient will be medically stable for discharge from the hospital within 2 midnights of admission. The following factors support the patient status of inpatient.   " The patient's presenting symptoms include severe abdominal pain. " The worrisome physical exam findings include severe lower abdominal pain with palpation. " The initial radiographic and laboratory data are worrisome because of diverticulitis with perforation versus pancolitis. " The chronic  co-morbidities include coronary artery disease, hyperlipidemia.   * I certify that at the point of admission it is my clinical judgment that the patient will require inpatient hospital care spanning beyond 2 midnights from the point of admission due to high intensity of service, high risk for further deterioration and high frequency of surveillance required.*   03/20/2019, 6:33 PM   Principal Problem:   Diverticulitis of large intestine with perforation Active Problems:   Coronary artery disease PTCA and stents to proximal LAD in 1998, last stress test December 2018-   Dyslipidemia

## 2019-03-20 NOTE — Consult Note (Signed)
Andrew Solomon 1950/11/27  [id number].    Requesting MD: Dr. Carmin Muskrat Chief Complaint/Reason for Consult: diverticulitis  HPI:  This is a 68 yo white male with a history of heart disease s/p PCI stenting over 20 yrs ago, not on blood thinners, HTN, and HLD who began having intermittent crampy abdominal pain about 1 week ago.  It has been in the RLQ as well as across his entire lower abdomen.  He states it "comes and goes."  He only admits to nausea when his pain gets severe.  He denies any diarrhea or constipation.  No blood in his stool.  He most recently had a colonoscopy about 4 years ago with 1 polyp removed and diverticulosis was found.  Otherwise it was normal.  He has no history or family history of crohn's disease, UC, or colon cancer.  Because his pain has persisted for over a week, he decided to present to the ED today for evaluation.  He denies any fevers.  His work up reveals normal vital signs, normal labs, but a CT scan that diverticulitis with some locules of free air, but no formed abscess.  We have been asked to see the patient for further evaluation and recommendations.  ROS: ROS: Please see HPI, otherwise negative except for urinary retention here as it hurts to strain void.  Family History  Problem Relation Age of Onset  . Heart disease Father   . Pancreatic cancer Father   . Bladder Cancer Mother     Past Medical History:  Diagnosis Date  . Heart disease   . HTN (hypertension)   . Hyperlipidemia     Past Surgical History:  Procedure Laterality Date  . 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 CORONARY STENT PLACEMENT Left    left main trunk artery  . left inguinal hernia repair      Social History:  reports that he quit smoking about 3 years ago. His smoking use included cigarettes. He has a 47.00 pack-year smoking history. He has never used smokeless tobacco. He reports current alcohol use. He reports that he does not use drugs.  Allergies:   Allergies  Allergen Reactions  . Niacin And Related   . Oxycodone   . Sulfur     (Not in a hospital admission)    Physical Exam: Blood pressure 129/67, pulse 73, temperature 97.9 F (36.6 C), temperature source Oral, resp. rate 18, SpO2 95 %. General: pleasant, WD, WN white male who is laying in bed in NAD HEENT: head is normocephalic, atraumatic.  Sclera are noninjected.  PERRL.  Ears and nose without any masses or lesions.  Mouth is pink and moist Heart: regular, rate, and rhythm.  Normal s1,s2. No obvious murmurs, gallops, or rubs noted.  Palpable radial and pedal pulses bilaterally Lungs: CTAB, no wheezes, rhonchi, or rales noted.  Respiratory effort nonlabored Abd: soft, diffuse tenderness specifically throughout the lower abdomen.  he voluntarily guards but does not have peritonitis, ND, hypoactive BS, no masses, hernias, or organomegaly MS: all 4 extremities are symmetrical with no cyanosis, clubbing, or edema. Skin: warm and dry with no masses, lesions, or rashes Psych: A&Ox3 with an appropriate affect.   Results for orders placed or performed during the hospital encounter of 03/20/19 (from the past 48 hour(s))  Comprehensive metabolic panel     Status: Abnormal   Collection Time: 03/20/19 10:06 AM  Result Value Ref Range   Sodium 137 135 - 145 mmol/L  Potassium 4.1 3.5 - 5.1 mmol/L   Chloride 104 98 - 111 mmol/L   CO2 25 22 - 32 mmol/L   Glucose, Bld 99 70 - 99 mg/dL   BUN 16 8 - 23 mg/dL   Creatinine, Ser 1.18 0.61 - 1.24 mg/dL   Calcium 9.0 8.9 - 10.3 mg/dL   Total Protein 7.4 6.5 - 8.1 g/dL   Albumin 3.8 3.5 - 5.0 g/dL   AST 41 15 - 41 U/L   ALT 53 (H) 0 - 44 U/L   Alkaline Phosphatase 56 38 - 126 U/L   Total Bilirubin 0.5 0.3 - 1.2 mg/dL   GFR calc non Af Amer >60 >60 mL/min   GFR calc Af Amer >60 >60 mL/min   Anion gap 8 5 - 15    Comment: Performed at [facility], [place] [address] [place], [place] [address]  Lipase, blood     Status:  None   Collection Time: 03/20/19 10:06 AM  Result Value Ref Range   Lipase 29 11 - 51 U/L    Comment: Performed at [facility], [place] [address], [place], [place] [address]  CBC with Differential     Status: None   Collection Time: 03/20/19 10:06 AM  Result Value Ref Range   WBC 7.6 4.0 - 10.5 K/uL   RBC 4.46 4.22 - 5.81 MIL/uL   Hemoglobin 13.4 13.0 - 17.0 g/dL   HCT 41.0 39.0 - 52.0 %   MCV 91.9 80.0 - 100.0 fL   MCH 30.0 26.0 - 34.0 pg   MCHC 32.7 30.0 - 36.0 g/dL   RDW 12.2 11.5 - 15.5 %   Platelets 258 150 - 400 K/uL   nRBC 0.0 0.0 - 0.2 %   Neutrophils Relative % 78 %   Neutro Abs 5.9 1.7 - 7.7 K/uL   Lymphocytes Relative 14 %   Lymphs Abs 1.0 0.7 - 4.0 K/uL   Monocytes Relative 6 %   Monocytes Absolute 0.4 0.1 - 1.0 K/uL   Eosinophils Relative 1 %   Eosinophils Absolute 0.1 0.0 - 0.5 K/uL   Basophils Relative 0 %   Basophils Absolute 0.0 0.0 - 0.1 K/uL   Immature Granulocytes 1 %   Abs Immature Granulocytes 0.06 0.00 - 0.07 K/uL    Comment: Performed at [facility], [place] [address], [place],  [address]  SARS Coronavirus 2 [employer] order, Performed in [facility] hospital lab) Nasopharyngeal Nasopharyngeal Swab     Status: None   Collection Time: 03/20/19 10:06 AM   Specimen: Nasopharyngeal Swab  Result Value Ref Range   SARS Coronavirus 2 NEGATIVE NEGATIVE    Comment: (NOTE) If result is NEGATIVE SARS-CoV-2 target nucleic acids are NOT DETECTED. The SARS-CoV-2 RNA is generally detectable in upper and lower  respiratory specimens during the acute phase of infection. The lowest  concentration of SARS-CoV-2 viral copies this assay can detect is 250  copies / mL. A negative result does not preclude SARS-CoV-2 infection  and should not be used as the sole basis for treatment or other  patient management decisions.  A negative result may occur with  improper specimen collection / handling, submission of specimen other  than  nasopharyngeal swab, presence of viral mutation(s) within the  areas targeted by this assay, and inadequate number of viral copies  (<250 copies / mL). A negative result must be combined with clinical  observations, patient history, and epidemiological information. If result is POSITIVE SARS-CoV-2 target  nucleic acids are DETECTED. The SARS-CoV-2 RNA is generally detectable in upper and lower  respiratory specimens dur ing the acute phase of infection.  Positive  results are indicative of active infection with SARS-CoV-2.  Clinical  correlation with patient history and other diagnostic information is  necessary to determine patient infection status.  Positive results do  not rule out bacterial infection or co-infection with other viruses. If result is PRESUMPTIVE POSTIVE SARS-CoV-2 nucleic acids MAY BE PRESENT.   A presumptive positive result was obtained on the submitted specimen  and confirmed on repeat testing.  While 2019 novel coronavirus  (SARS-CoV-2) nucleic acids may be present in the submitted sample  additional confirmatory testing may be necessary for epidemiological  and / or clinical management purposes  to differentiate between  SARS-CoV-2 and other Sarbecovirus currently known to infect humans.  If clinically indicated additional testing with an alternate test  methodology [phone]) is advised. The SARS-CoV-2 RNA is generally  detectable in upper and lower respiratory sp ecimens during the acute  phase of infection. The expected result is Negative. Fact Sheet for Patients:  StrictlyIdeas.no Fact Sheet for Healthcare Providers: BankingDealers.co.za This test is not yet approved or cleared by the [place] FDA and has been authorized for detection and/or diagnosis of SARS-CoV-2 by FDA under an Emergency Use Authorization (EUA).  This EUA will remain in effect (meaning this test can be used) for the duration of the  COVID-19 declaration under Section 564(b)(1) of the Act, 21 U.S.C. section 360bbb-3(b)(1), unless the authorization is terminated or revoked sooner. Performed at [facility], [place] [address], [place], [place] [address]   Urinalysis, Routine w reflex microscopic     Status: Abnormal   Collection Time: 03/20/19  1:55 PM  Result Value Ref Range   Color, Urine YELLOW YELLOW   APPearance CLEAR CLEAR   Specific Gravity, Urine >1.046 (H) 1.005 - 1.030   pH 5.0 5.0 - 8.0   Glucose, UA NEGATIVE NEGATIVE mg/dL   Hgb urine dipstick NEGATIVE NEGATIVE   Bilirubin Urine NEGATIVE NEGATIVE   Ketones, ur NEGATIVE NEGATIVE mg/dL   Protein, ur NEGATIVE NEGATIVE mg/dL   Nitrite NEGATIVE NEGATIVE   Leukocytes,Ua NEGATIVE NEGATIVE    Comment: Performed at [place] [address], [place], [place] [address]   Ct Abdomen Pelvis W Contrast  Result Date: 03/20/2019 CLINICAL DATA:  Acute right-sided abdominal pain. EXAM: CT ABDOMEN AND PELVIS WITH CONTRAST TECHNIQUE: Multidetector CT imaging of the abdomen and pelvis was performed using the standard protocol following bolus administration of intravenous contrast. CONTRAST:  122mL OMNIPAQUE IOHEXOL 300 MG/ML  SOLN COMPARISON:  None. FINDINGS: Lower chest: No acute abnormality. Hepatobiliary: No gallstones or biliary dilatation is noted. Multiple hepatic low densities are noted most consistent with cysts. Pancreas: Unremarkable. No pancreatic ductal dilatation or surrounding inflammatory changes. Spleen: Normal in size without focal abnormality. Adrenals/Urinary Tract: Adrenal glands are unremarkable. Kidneys are normal, without renal calculi, focal lesion, or hydronephrosis. Bladder is unremarkable. Stomach/Bowel: The stomach appears normal. The appendix appears normal. There appears to be substantial wall thickening involving the proximal sigmoid colon concerning for diverticulitis. There is seen some inflammatory changes and  free fluid in the surrounding peritoneal fat superior to this portion of colon with some free air present suggesting small perforation. There is some dilatation and wall thickening of small bowel loops in this area suggesting secondary inflammation of the small bowel. Vascular/Lymphatic: Aortic atherosclerosis. No enlarged abdominal or pelvic lymph nodes. Reproductive: Mild prostatic  enlargement is noted. Other: No hiatal hernia is noted. Musculoskeletal: No acute or significant osseous findings. IMPRESSION: Findings most consistent with proximal sigmoid diverticulitis, with inflammatory changes and free fluid in the surrounding peritoneal fat superior to this portion of the colon, with some amount of free air present suggesting small perforation. Inflammatory changes of adjacent small bowel loops are noted as well. Aortic Atherosclerosis (ICD10-I70.0). Electronically Signed   By: Marijo Conception M.D.   On: 03/20/2019 13:01      Assessment/Plan HTN HLD CAD, s/p stenting  Abdominal pain, diverticulitis vs pan colitis The patient's imaging has been reviewed by myself as well as Dr. Rosendo Gros.  The entirety of his colon appears thickened and abnormal.  The greatest is noted in the sigmoid colon.  He may have bad diverticulitis with associated inflammatory changes involving the rest of the colon, vs pancolitis of uncertain etiology.  Either way, the patient does not have an acute abdomen at this time and the treatment for both would be NPO and IV abx therapy.  We agree with initiation of zosyn given appearance on CT scan.  We have discussed with the patient the plan for conservative management, but have also discussed that if he fails to improve he may require a drain if he developed an abscess vs surgical intervention with an ostomy (partial or total).  He understands and is agreeable to proceed with the current plan.  He does not have a family history of UC, crohn's, etc.  He may need to see GI again as an  outpatient once he resolves, but no acute needs as no need for colonoscopy in this inflammatory stage.  No history of diarrhea so unlikely to be c diff.  We will continue to follow with you.   FEN - NPO x ice chips VTE - ok from our standpoint ID - [email or web address] 10/5 --[place], [facility] Surgery 03/20/2019, 2:44 PM Pager: [phone]

## 2019-03-20 NOTE — ED Notes (Signed)
Patient transported to CT 

## 2019-03-20 NOTE — ED Provider Notes (Signed)
[facility] DEPT Provider Note   CSN: [id number] Arrival date & time: 03/20/19  [id number]     History   Chief Complaint Chief Complaint  Patient presents with   Abdominal Pain    HPI Andrew Solomon is a 68 y.o. male.     HPI Patient presents with concern of abdominal pain. Onset was about 10 days ago after he was eating a piece of fruit. Since that time he has had diffuse abdominal pain, though it has become a more focal in his right lower quadrant. Pain is severe, waxing, waning severity, not improved with anything including enema. He has had bowel movements including earlier today. There is nausea, but no vomiting. Patient has no history of abdominal surgery beyond hernia repair.  He states that he is generally well, takes medication for history of CAD, hypertension. Today, with worsening pain in his right lower quadrant, which did not improve substantially after taking a dose of narcotic tablet he presents for evaluation.  History provided by the patient himself and EMS providers. Past Medical History:  Diagnosis Date   Heart disease    HTN (hypertension)    Hyperlipidemia     Patient Active Problem List   Diagnosis Date Noted   Coronary artery disease PTCA and stents to proximal LAD in 1998, last stress test December 2018- 08/05/2018   Dyslipidemia 08/05/2018   Essential hypertension 08/05/2018    Past Surgical History:  Procedure Laterality Date   CORONARY STENT PLACEMENT Left    left main trunk artery        Home Medications    Prior to Admission medications   Medication Sig Start Date End Date Taking? Authorizing Provider  acetaminophen (TYLENOL) 325 MG tablet Take 650 mg by mouth every 6 (six) hours as needed for mild pain or headache.   Yes [provider]  aspirin EC 81 MG tablet Take 81 mg by mouth daily.   Yes [provider]  atenolol (TENORMIN) 25 MG tablet Take 1 tablet (25 mg total) by mouth  daily. 10/27/18  Yes Park Liter, MD  buPROPion [facility] SR) 150 MG 12 hr tablet Take 150 mg by mouth daily.   Yes [provider]  Cholecalciferol (VITAMIN D3) 1000 units CAPS Take 1 capsule by mouth daily.   Yes [provider]  ezetimibe (ZETIA) 10 MG tablet TAKE 1 TABLET BY MOUTH EVERY DAY Patient taking differently: Take 10 mg by mouth daily.  02/22/19  Yes Park Liter, MD  Omega-3 Fatty Acids (FISH OIL) 1000 MG CAPS Take 3 capsules by mouth daily.   Yes [provider]  omeprazole (PRILOSEC) 40 MG capsule Take 40 mg by mouth daily.   Yes [provider]  Pyridoxine HCl (VITAMIN B-6) 500 MG tablet Take 500 mg by mouth daily.   Yes [provider]  rosuvastatin (CRESTOR) 40 MG tablet TAKE 1 TABLET BY MOUTH EVERY DAY ON MONDAY, WEDNESDAY AND FRIDAY AND TAKE 1/2 A TABLET ON TUES, [place] SUNDAY Patient taking differently: Take 20-40 mg by mouth daily. 40mg  on Monday, Wednesday, Friday 20mg  on Tuesday, Thursday, Saturday, sunday 02/17/19  Yes Park Liter, MD  Selenium (SELENIMIN PO) Take 1 capsule by mouth daily.   Yes [provider]  sodium chloride (OCEAN) 0.65 % SOLN nasal spray Place 1 spray into both nostrils as needed for congestion.   Yes [provider]  tiZANidine (ZANAFLEX) 4 MG tablet Take 4 mg by mouth every 6 (  six) hours as needed for muscle spasms.   Yes [provider]  vitamin B-12 (CYANOCOBALAMIN) 1000 MCG tablet Take 1,000 mcg by mouth daily.   Yes [provider]    Family History Family History  Problem Relation Age of Onset   Heart disease Father    Pancreatic cancer Father    Bladder Cancer Mother     Social History Social History   Tobacco Use   Smoking status: Former Smoker    Packs/day: 1.00    Years: 47.00    Pack years: 47.00    Types: Cigarettes    Quit date: 06/04/2015    Years since quitting: 3.7   Smokeless tobacco: Never Used    Substance Use Topics   Alcohol use: Yes    Alcohol/week: 0.0 standard drinks    Comment: rare/occ   Drug use: No     Allergies   Niacin and related, Oxycodone, and Sulfur   Review of Systems Review of Systems  Constitutional:       Per HPI, otherwise negative  HENT:       Per HPI, otherwise negative  Respiratory:       Per HPI, otherwise negative  Cardiovascular:       Per HPI, otherwise negative  Gastrointestinal: Positive for abdominal pain and nausea. Negative for vomiting.  Endocrine:       Negative aside from HPI  Genitourinary:       Neg aside from HPI   Musculoskeletal:       Per HPI, otherwise negative  Skin: Negative.   Neurological: Negative for syncope.     Physical Exam Updated Vital Signs BP (!) 105/43    Pulse 73    Temp 97.9 F (36.6 C) (Oral)    Resp 18    SpO2 96%   Physical Exam Vitals signs and nursing note reviewed.  Constitutional:      General: He is not in acute distress.    Appearance: He is well-developed.  HENT:     Head: Normocephalic and atraumatic.  Eyes:     Conjunctiva/sclera: Conjunctivae normal.  Cardiovascular:     Rate and Rhythm: Normal rate and regular rhythm.  Pulmonary:     Effort: Pulmonary effort is normal. No respiratory distress.     Breath sounds: No stridor.  Abdominal:     General: There is distension.     Tenderness: There is abdominal tenderness. There is guarding.  Skin:    General: Skin is warm and dry.  Neurological:     Mental Status: He is alert and oriented to person, place, and time.      ED Treatments / Results  Labs (all labs ordered are listed, but only abnormal results are displayed) Labs Reviewed  COMPREHENSIVE METABOLIC PANEL - Abnormal; Notable for the following components:      Result Value   ALT 53 (*)    All other components within normal limits  SARS CORONAVIRUS 2 (HOSPITAL ORDER, [facility])  LIPASE, BLOOD  CBC WITH DIFFERENTIAL/PLATELET   URINALYSIS, ROUTINE W REFLEX MICROSCOPIC    Radiology Ct Abdomen Pelvis W Contrast  Result Date: 03/20/2019 CLINICAL DATA:  Acute right-sided abdominal pain. EXAM: CT ABDOMEN AND PELVIS WITH CONTRAST TECHNIQUE: Multidetector CT imaging of the abdomen and pelvis was performed using the standard protocol following bolus administration of intravenous contrast. CONTRAST:  176mL OMNIPAQUE IOHEXOL 300 MG/ML  SOLN COMPARISON:  None. FINDINGS: Lower chest: No acute abnormality. Hepatobiliary: No gallstones or biliary  dilatation is noted. Multiple hepatic low densities are noted most consistent with cysts. Pancreas: Unremarkable. No pancreatic ductal dilatation or surrounding inflammatory changes. Spleen: Normal in size without focal abnormality. Adrenals/Urinary Tract: Adrenal glands are unremarkable. Kidneys are normal, without renal calculi, focal lesion, or hydronephrosis. Bladder is unremarkable. Stomach/Bowel: The stomach appears normal. The appendix appears normal. There appears to be substantial wall thickening involving the proximal sigmoid colon concerning for diverticulitis. There is seen some inflammatory changes and free fluid in the surrounding peritoneal fat superior to this portion of colon with some free air present suggesting small perforation. There is some dilatation and wall thickening of small bowel loops in this area suggesting secondary inflammation of the small bowel. Vascular/Lymphatic: Aortic atherosclerosis. No enlarged abdominal or pelvic lymph nodes. Reproductive: Mild prostatic enlargement is noted. Other: No hiatal hernia is noted. Musculoskeletal: No acute or significant osseous findings. IMPRESSION: Findings most consistent with proximal sigmoid diverticulitis, with inflammatory changes and free fluid in the surrounding peritoneal fat superior to this portion of the colon, with some amount of free air present suggesting small perforation. Inflammatory changes of adjacent small bowel  loops are noted as well. Aortic Atherosclerosis (ICD10-I70.0). Electronically Signed   By: Marijo Conception M.D.   On: 03/20/2019 13:01    Procedures Procedures (including critical care time)  Medications Ordered in ED Medications  sodium chloride (PF) 0.9 % injection (has no administration in time range)  piperacillin-tazobactam (ZOSYN) IVPB 3.375 g (has no administration in time range)  sodium chloride 0.9 % bolus 500 mL (0 mLs Intravenous Stopped 03/20/19 1053)  fentaNYL (SUBLIMAZE) injection 25 mcg (25 mcg Intravenous Given 03/20/19 1005)  ondansetron (ZOFRAN) injection 4 mg (4 mg Intravenous Given 03/20/19 1005)  HYDROmorphone (DILAUDID) injection 1 mg (1 mg Intravenous Given 03/20/19 1121)  iohexol (OMNIPAQUE) 300 MG/ML solution 100 mL (100 mLs Intravenous Contrast Given 03/20/19 1207)     Initial Impression / Assessment and Plan / ED Course  I have reviewed the triage vital signs and the nursing notes.  Pertinent labs & imaging results that were available during my care of the patient were reviewed by me and considered in my medical decision making (see chart for details).        1:45 PM Patient CT scan consistent with perforated diverticulitis, with fluid and free air in the abdomen  I discussed the patient's case with our surgical colleagues.  However, given the patient's timeframe, with abdominal pain for about 1 week, her suspicion that this occurred before, and he has been compensated since that point. However, with intra-abdominal infection, patient is started Zosyn, continues to receive fluids. COVID negative, but given concern for appropriate diverticulitis he will require admission for further monitoring, management.  Final Clinical Impressions(s) / ED Diagnoses   Final diagnoses:  Diverticulitis of large intestine with perforation without bleeding      Carmin Muskrat, MD 03/20/19 1347

## 2019-03-20 NOTE — ED Notes (Signed)
Pt requesting something for pain. MD/RN contacted.

## 2019-03-20 NOTE — ED Notes (Signed)
Transport called.

## 2019-03-20 NOTE — ED Notes (Signed)
ED TO INPATIENT HANDOFF REPORT  ED Nurse Name and Phone #:   S Name/Age/Gender Andrew Solomon 68 y.o. male Room/Bed: WA19/WA19  Code Status   Code Status: Not on file  Home/SNF/Other Home Patient oriented to: self, place, time and situation Is this baseline? Yes   Triage Complete: Triage complete  Chief Complaint abdominal pain  Triage Note Per [facility] pt c/o right abd pains for week that radiates to groin and been more constant. Had some mild diarrhea today.    Allergies Allergies  Allergen Reactions  . Niacin And Related   . Oxycodone   . Sulfur     Level of Care/Admitting Diagnosis ED Disposition    ED Disposition Condition Comment   Admit  Hospital Area: [facility] [[id number]]  Level of Care: Med-Surg [16]  Covid Evaluation: Asymptomatic Screening Protocol (No Symptoms)  Diagnosis: Diverticulitis of large intestine with perforation [id number]  Admitting Physician: [place], [place]  Attending Physician: Samuella Cota [[id number]]  Estimated length of stay: past midnight tomorrow  Certification:: I certify this patient will need inpatient services for at least 2 midnights  PT Class (Do Not Modify): Inpatient [101]  PT Acc Code (Do Not Modify): Private [1]       B Medical/Surgery History Past Medical History:  Diagnosis Date  . Heart disease   . HTN (hypertension)   . Hyperlipidemia    Past Surgical History:  Procedure Laterality Date  . 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 CORONARY STENT PLACEMENT Left    left main trunk artery  . left inguinal hernia repair       A IV Location/Drains/Wounds Patient Lines/Drains/Airways Status   Active Line/Drains/Airways    Name:   Placement date:   Placement time:   Site:   Days:   Peripheral IV 03/20/19 Right Antecubital   03/20/19    1004    Antecubital   less than 1          Intake/Output Last 24 hours  Intake/Output Summary (Last 24 hours) at 03/20/2019 1618 Last data  filed at 03/20/2019 1521 Gross per 24 hour  Intake 539.16 ml  Output 500 ml  Net 39.16 ml    Labs/Imaging Results for orders placed or performed during the hospital encounter of 03/20/19 (from the past 48 hour(s))  Comprehensive metabolic panel     Status: Abnormal   Collection Time: 03/20/19 10:06 AM  Result Value Ref Range   Sodium 137 135 - 145 mmol/L   Potassium 4.1 3.5 - 5.1 mmol/L   Chloride 104 98 - 111 mmol/L   CO2 25 22 - 32 mmol/L   Glucose, Bld 99 70 - 99 mg/dL   BUN 16 8 - 23 mg/dL   Creatinine, Ser 1.18 0.61 - 1.24 mg/dL   Calcium 9.0 8.9 - 10.3 mg/dL   Total Protein 7.4 6.5 - 8.1 g/dL   Albumin 3.8 3.5 - 5.0 g/dL   AST 41 15 - 41 U/L   ALT 53 (H) 0 - 44 U/L   Alkaline Phosphatase 56 38 - 126 U/L   Total Bilirubin 0.5 0.3 - 1.2 mg/dL   GFR calc non Af Amer >60 >60 mL/min   GFR calc Af Amer >60 >60 mL/min   Anion gap 8 5 - 15    Comment: Performed at [facility], [place] [address], [place], [place] [address]  Lipase, blood     Status: None   Collection Time: 03/20/19 10:06  AM  Result Value Ref Range   Lipase 29 11 - 51 U/L    Comment: Performed at [facility], [place] [address] [place], [place] [address]  CBC with Differential     Status: None   Collection Time: 03/20/19 10:06 AM  Result Value Ref Range   WBC 7.6 4.0 - 10.5 K/uL   RBC 4.46 4.22 - 5.81 MIL/uL   Hemoglobin 13.4 13.0 - 17.0 g/dL   HCT 41.0 39.0 - 52.0 %   MCV 91.9 80.0 - 100.0 fL   MCH 30.0 26.0 - 34.0 pg   MCHC 32.7 30.0 - 36.0 g/dL   RDW 12.2 11.5 - 15.5 %   Platelets 258 150 - 400 K/uL   nRBC 0.0 0.0 - 0.2 %   Neutrophils Relative % 78 %   Neutro Abs 5.9 1.7 - 7.7 K/uL   Lymphocytes Relative 14 %   Lymphs Abs 1.0 0.7 - 4.0 K/uL   Monocytes Relative 6 %   Monocytes Absolute 0.4 0.1 - 1.0 K/uL   Eosinophils Relative 1 %   Eosinophils Absolute 0.1 0.0 - 0.5 K/uL   Basophils Relative 0 %   Basophils Absolute 0.0 0.0 - 0.1 K/uL   Immature Granulocytes  1 %   Abs Immature Granulocytes 0.06 0.00 - 0.07 K/uL    Comment: Performed at [facility], [place] [address], [place], [place] [address]  SARS Coronavirus 2 [facility] order, Performed in [facility] hospital lab) Nasopharyngeal Nasopharyngeal Swab     Status: None   Collection Time: 03/20/19 10:06 AM   Specimen: Nasopharyngeal Swab  Result Value Ref Range   SARS Coronavirus 2 NEGATIVE NEGATIVE    Comment: (NOTE) If result is NEGATIVE SARS-CoV-2 target nucleic acids are NOT DETECTED. The SARS-CoV-2 RNA is generally detectable in upper and lower  respiratory specimens during the acute phase of infection. The lowest  concentration of SARS-CoV-2 viral copies this assay can detect is 250  copies / mL. A negative result does not preclude SARS-CoV-2 infection  and should not be used as the sole basis for treatment or other  patient management decisions.  A negative result may occur with  improper specimen collection / handling, submission of specimen other  than nasopharyngeal swab, presence of viral mutation(s) within the  areas targeted by this assay, and inadequate number of viral copies  (<250 copies / mL). A negative result must be combined with clinical  observations, patient history, and epidemiological information. If result is POSITIVE SARS-CoV-2 target nucleic acids are DETECTED. The SARS-CoV-2 RNA is generally detectable in upper and lower  respiratory specimens dur ing the acute phase of infection.  Positive  results are indicative of active infection with SARS-CoV-2.  Clinical  correlation with patient history and other diagnostic information is  necessary to determine patient infection status.  Positive results do  not rule out bacterial infection or co-infection with other viruses. If result is PRESUMPTIVE POSTIVE SARS-CoV-2 nucleic acids MAY BE PRESENT.   A presumptive positive result was obtained on the submitted specimen  and confirmed on repeat testing.   While 2019 novel coronavirus  (SARS-CoV-2) nucleic acids may be present in the submitted sample  additional confirmatory testing may be necessary for epidemiological  and / or clinical management purposes  to differentiate between  SARS-CoV-2 and other Sarbecovirus currently known to infect humans.  If clinically indicated additional testing with an alternate test  methodology [phone]) is advised. The SARS-CoV-2 RNA is generally  detectable in  upper and lower respiratory sp ecimens during the acute  phase of infection. The expected result is Negative. Fact Sheet for Patients:  StrictlyIdeas.no Fact Sheet for Healthcare Providers: BankingDealers.co.za This test is not yet approved or cleared by the [place] FDA and has been authorized for detection and/or diagnosis of SARS-CoV-2 by FDA under an Emergency Use Authorization (EUA).  This EUA will remain in effect (meaning this test can be used) for the duration of the COVID-19 declaration under Section 564(b)(1) of the Act, 21 U.S.C. section 360bbb-3(b)(1), unless the authorization is terminated or revoked sooner. Performed at [facility], [place] [address] [place], [place] [address]   Urinalysis, Routine w reflex microscopic     Status: Abnormal   Collection Time: 03/20/19  1:55 PM  Result Value Ref Range   Color, Urine YELLOW YELLOW   APPearance CLEAR CLEAR   Specific Gravity, Urine >1.046 (H) 1.005 - 1.030   pH 5.0 5.0 - 8.0   Glucose, UA NEGATIVE NEGATIVE mg/dL   Hgb urine dipstick NEGATIVE NEGATIVE   Bilirubin Urine NEGATIVE NEGATIVE   Ketones, ur NEGATIVE NEGATIVE mg/dL   Protein, ur NEGATIVE NEGATIVE mg/dL   Nitrite NEGATIVE NEGATIVE   Leukocytes,Ua NEGATIVE NEGATIVE    Comment: Performed at [facility] [address] [place],  [address]   Ct Abdomen Pelvis W Contrast  Result Date: 03/20/2019 CLINICAL DATA:  Acute  right-sided abdominal pain. EXAM: CT ABDOMEN AND PELVIS WITH CONTRAST TECHNIQUE: Multidetector CT imaging of the abdomen and pelvis was performed using the standard protocol following bolus administration of intravenous contrast. CONTRAST:  154mL OMNIPAQUE IOHEXOL 300 MG/ML  SOLN COMPARISON:  None. FINDINGS: Lower chest: No acute abnormality. Hepatobiliary: No gallstones or biliary dilatation is noted. Multiple hepatic low densities are noted most consistent with cysts. Pancreas: Unremarkable. No pancreatic ductal dilatation or surrounding inflammatory changes. Spleen: Normal in size without focal abnormality. Adrenals/Urinary Tract: Adrenal glands are unremarkable. Kidneys are normal, without renal calculi, focal lesion, or hydronephrosis. Bladder is unremarkable. Stomach/Bowel: The stomach appears normal. The appendix appears normal. There appears to be substantial wall thickening involving the proximal sigmoid colon concerning for diverticulitis. There is seen some inflammatory changes and free fluid in the surrounding peritoneal fat superior to this portion of colon with some free air present suggesting small perforation. There is some dilatation and wall thickening of small bowel loops in this area suggesting secondary inflammation of the small bowel. Vascular/Lymphatic: Aortic atherosclerosis. No enlarged abdominal or pelvic lymph nodes. Reproductive: Mild prostatic enlargement is noted. Other: No hiatal hernia is noted. Musculoskeletal: No acute or significant osseous findings. IMPRESSION: Findings most consistent with proximal sigmoid diverticulitis, with inflammatory changes and free fluid in the surrounding peritoneal fat superior to this portion of the colon, with some amount of free air present suggesting small perforation. Inflammatory changes of adjacent small bowel loops are noted as well. Aortic Atherosclerosis (ICD10-I70.0). Electronically Signed   By: Marijo Conception M.D.   On: 03/20/2019 13:01     Pending Labs Unresulted Labs (From admission, onward)   None      Vitals/Pain Today's Vitals   03/20/19 1230 03/20/19 1356 03/20/19 1514 03/20/19 1525  BP: 129/67 129/67  118/66  Pulse: 79 73 72 75  Resp:  18  18  Temp:      TempSrc:      SpO2: 95% 95% 94% 92%  PainSc:        Isolation Precautions No active isolations  Medications Medications  sodium  chloride (PF) 0.9 % injection (has no administration in time range)  piperacillin-tazobactam (ZOSYN) IVPB 3.375 g (has no administration in time range)  sodium chloride 0.9 % bolus 500 mL (0 mLs Intravenous Stopped 03/20/19 1053)  fentaNYL (SUBLIMAZE) injection 25 mcg (25 mcg Intravenous Given 03/20/19 1005)  ondansetron (ZOFRAN) injection 4 mg (4 mg Intravenous Given 03/20/19 1005)  HYDROmorphone (DILAUDID) injection 1 mg (1 mg Intravenous Given 03/20/19 1121)  iohexol (OMNIPAQUE) 300 MG/ML solution 100 mL (100 mLs Intravenous Contrast Given 03/20/19 1207)  piperacillin-tazobactam (ZOSYN) IVPB 3.375 g (0 g Intravenous Stopped 03/20/19 1521)  HYDROmorphone (DILAUDID) injection 1 mg (1 mg Intravenous Given 03/20/19 1521)    Mobility walks Low fall risk   Focused Assessments    R Recommendations: See Admitting Provider Note  Report given to:   Additional Notes:

## 2019-03-21 ENCOUNTER — Other Ambulatory Visit: Payer: Self-pay | Admitting: Cardiology

## 2019-03-21 ENCOUNTER — Other Ambulatory Visit: Payer: Self-pay

## 2019-03-21 ENCOUNTER — Inpatient Hospital Stay (HOSPITAL_COMMUNITY): Payer: Medicare Other

## 2019-03-21 LAB — CBC WITH DIFFERENTIAL/PLATELET
Abs Immature Granulocytes: 0 10*3/uL (ref 0.00–0.07)
Band Neutrophils: 22 %
Basophils Absolute: 0 10*3/uL (ref 0.0–0.1)
Basophils Relative: 0 %
Eosinophils Absolute: 0.1 10*3/uL (ref 0.0–0.5)
Eosinophils Relative: 1 %
HCT: 38.5 % — ABNORMAL LOW (ref 39.0–52.0)
Hemoglobin: 12.4 g/dL — ABNORMAL LOW (ref 13.0–17.0)
Lymphocytes Relative: 13 %
Lymphs Abs: 1.9 10*3/uL (ref 0.7–4.0)
MCH: 30.1 pg (ref 26.0–34.0)
MCHC: 32.2 g/dL (ref 30.0–36.0)
MCV: 93.4 fL (ref 80.0–100.0)
Monocytes Absolute: 0.6 10*3/uL (ref 0.1–1.0)
Monocytes Relative: 4 %
Neutro Abs: 12.2 10*3/uL — ABNORMAL HIGH (ref 1.7–7.7)
Neutrophils Relative %: 60 %
Platelets: 221 10*3/uL (ref 150–400)
RBC: 4.12 MIL/uL — ABNORMAL LOW (ref 4.22–5.81)
RDW: 12.7 % (ref 11.5–15.5)
WBC Morphology: INCREASED
WBC: 14.9 10*3/uL — ABNORMAL HIGH (ref 4.0–10.5)
nRBC: 0 % (ref 0.0–0.2)

## 2019-03-21 LAB — RENAL FUNCTION PANEL
Albumin: 3 g/dL — ABNORMAL LOW (ref 3.5–5.0)
Anion gap: 8 (ref 5–15)
BUN: 22 mg/dL (ref 8–23)
CO2: 21 mmol/L — ABNORMAL LOW (ref 22–32)
Calcium: 7.7 mg/dL — ABNORMAL LOW (ref 8.9–10.3)
Chloride: 106 mmol/L (ref 98–111)
Creatinine, Ser: 1.27 mg/dL — ABNORMAL HIGH (ref 0.61–1.24)
GFR calc Af Amer: >60 mL/min (ref >60)
GFR calc non Af Amer: 57 mL/min — ABNORMAL LOW (ref >60)
Glucose, Bld: 88 mg/dL (ref 70–99)
Phosphorus: 3.1 mg/dL (ref 2.5–4.6)
Potassium: 4.2 mmol/L (ref 3.5–5.1)
Sodium: 135 mmol/L (ref 135–145)

## 2019-03-21 LAB — COMPREHENSIVE METABOLIC PANEL
ALT: 37 U/L (ref 0–44)
AST: 26 U/L (ref 15–41)
Albumin: 3.1 g/dL — ABNORMAL LOW (ref 3.5–5.0)
Alkaline Phosphatase: 45 U/L (ref 38–126)
Anion gap: 10 (ref 5–15)
BUN: 17 mg/dL (ref 8–23)
CO2: 21 mmol/L — ABNORMAL LOW (ref 22–32)
Calcium: 8 mg/dL — ABNORMAL LOW (ref 8.9–10.3)
Chloride: 107 mmol/L (ref 98–111)
Creatinine, Ser: 1.26 mg/dL — ABNORMAL HIGH (ref 0.61–1.24)
GFR calc Af Amer: >60 mL/min (ref >60)
GFR calc non Af Amer: 57 mL/min — ABNORMAL LOW (ref >60)
Glucose, Bld: 95 mg/dL (ref 70–99)
Potassium: 4.2 mmol/L (ref 3.5–5.1)
Sodium: 138 mmol/L (ref 135–145)
Total Bilirubin: 1.1 mg/dL (ref 0.3–1.2)
Total Protein: 6.2 g/dL — ABNORMAL LOW (ref 6.5–8.1)

## 2019-03-21 LAB — CBC
HCT: 38.7 % — ABNORMAL LOW (ref 39.0–52.0)
Hemoglobin: 12.6 g/dL — ABNORMAL LOW (ref 13.0–17.0)
MCH: 30.2 pg (ref 26.0–34.0)
MCHC: 32.6 g/dL (ref 30.0–36.0)
MCV: 92.8 fL (ref 80.0–100.0)
Platelets: 254 10*3/uL (ref 150–400)
RBC: 4.17 MIL/uL — ABNORMAL LOW (ref 4.22–5.81)
RDW: 12.7 % (ref 11.5–15.5)
WBC: 13.2 10*3/uL — ABNORMAL HIGH (ref 4.0–10.5)
nRBC: 0 % (ref 0.0–0.2)

## 2019-03-21 LAB — HIV ANTIBODY (ROUTINE TESTING W REFLEX): HIV Screen 4th Generation wRfx: NONREACTIVE

## 2019-03-21 MED ORDER — ALBUTEROL SULFATE (2.5 MG/3ML) 0.083% IN NEBU
2.5000 mg | INHALATION_SOLUTION | RESPIRATORY_TRACT | Status: DC
  Administered 2019-03-21: 20:00:00 2.5 mg via RESPIRATORY_TRACT
  Filled 2019-03-21: qty 3

## 2019-03-21 MED ORDER — ALBUTEROL SULFATE (2.5 MG/3ML) 0.083% IN NEBU
2.5000 mg | INHALATION_SOLUTION | RESPIRATORY_TRACT | Status: DC | PRN
  Administered 2019-03-22 – 2019-04-05 (×15): 2.5 mg via RESPIRATORY_TRACT
  Filled 2019-03-21 (×15): qty 3

## 2019-03-21 NOTE — Progress Notes (Signed)
Patient has been in severe pain during the night and was given dilaudid every 2 hour for pain management.  Describe pain as sore and cramp at times.

## 2019-03-21 NOTE — Progress Notes (Signed)
Called MD concerning pt wheezing and feeling like he can't breathe.  Pt stated he could not lay in the bed or he would suffocate. Orders were given.

## 2019-03-21 NOTE — Progress Notes (Signed)
Subjective/Chief Complaint: Pt will less pain this AM No BM Difficulty with urination   Objective: Vital signs in last 24 hours: Temp:  [97.9 F (36.6 C)-101.4 F (38.6 C)] 99.1 F (37.3 C) (10/06 0455) Pulse Rate:  [57-79] 77 (10/06 0455) Resp:  [12-24] 18 (10/06 0455) BP: (92-147)/(43-87) 103/63 (10/06 0455) SpO2:  [91 %-100 %] 95 % (10/06 0455) Weight:  [79.1 kg] 79.1 kg (10/05 1700) Last BM Date: 03/20/19  Intake/Output from previous day: 10/05 0701 - 10/06 0700 In: 1782.1 [I.V.:1242.9; IV Piggyback:539.2] Out: 900 [Urine:900] Intake/Output this shift: No intake/output data recorded.  Constitutional: No acute distress, conversant, appears states age. Eyes: Anicteric sclerae, moist conjunctiva, no lid lag Lungs: Clear to auscultation bilaterally, normal respiratory effort CV: regular rate and rhythm, no murmurs, no peripheral edema, pedal pulses 2+ GI: Soft, no masses or hepatosplenomegaly, tender to palpation RLQ>LLQ, no rebound, guarding Skin: No rashes, palpation reveals normal turgor Psychiatric: appropriate judgment and insight, oriented to person, place, and time   Lab Results:  Recent Labs    03/20/19 1006 03/21/19 0326  WBC 7.6 13.2*  HGB 13.4 12.6*  HCT 41.0 38.7*  PLT 258 254   BMET Recent Labs    03/20/19 1006 03/21/19 0326  NA 137 138  K 4.1 4.2  CL 104 107  CO2 25 21*  GLUCOSE 99 95  BUN 16 17  CREATININE 1.18 1.26*  CALCIUM 9.0 8.0*   Studies/Results: Ct Abdomen Pelvis W Contrast  Result Date: 03/20/2019 CLINICAL DATA:  Acute right-sided abdominal pain. EXAM: CT ABDOMEN AND PELVIS WITH CONTRAST TECHNIQUE: Multidetector CT imaging of the abdomen and pelvis was performed using the standard protocol following bolus administration of intravenous contrast. CONTRAST:  15mL OMNIPAQUE IOHEXOL 300 MG/ML  SOLN COMPARISON:  None. FINDINGS: Lower chest: No acute abnormality. Hepatobiliary: No gallstones or biliary dilatation is noted. Multiple  hepatic low densities are noted most consistent with cysts. Pancreas: Unremarkable. No pancreatic ductal dilatation or surrounding inflammatory changes. Spleen: Normal in size without focal abnormality. Adrenals/Urinary Tract: Adrenal glands are unremarkable. Kidneys are normal, without renal calculi, focal lesion, or hydronephrosis. Bladder is unremarkable. Stomach/Bowel: The stomach appears normal. The appendix appears normal. There appears to be substantial wall thickening involving the proximal sigmoid colon concerning for diverticulitis. There is seen some inflammatory changes and free fluid in the surrounding peritoneal fat superior to this portion of colon with some free air present suggesting small perforation. There is some dilatation and wall thickening of small bowel loops in this area suggesting secondary inflammation of the small bowel. Vascular/Lymphatic: Aortic atherosclerosis. No enlarged abdominal or pelvic lymph nodes. Reproductive: Mild prostatic enlargement is noted. Other: No hiatal hernia is noted. Musculoskeletal: No acute or significant osseous findings. IMPRESSION: Findings most consistent with proximal sigmoid diverticulitis, with inflammatory changes and free fluid in the surrounding peritoneal fat superior to this portion of the colon, with some amount of free air present suggesting small perforation. Inflammatory changes of adjacent small bowel loops are noted as well. Aortic Atherosclerosis (ICD10-I70.0). Electronically Signed   By: Marijo Conception M.D.   On: 03/20/2019 13:01    Anti-infectives: Anti-infectives (From admission, onward)   Start     Dose/Rate Route Frequency Ordered Stop   03/20/19 2000  piperacillin-tazobactam (ZOSYN) IVPB 3.375 g     3.375 g 12.5 mL/hr over 240 Minutes Intravenous Every 8 hours 03/20/19 1701     03/20/19 1500  piperacillin-tazobactam (ZOSYN) IVPB 3.375 g  Status:  Discontinued     3.375  g 100 mL/hr over 30 Minutes Intravenous Every 8 hours  03/20/19 1453 03/20/19 1701   03/20/19 1315  piperacillin-tazobactam (ZOSYN) IVPB 3.375 g     3.375 g 100 mL/hr over 30 Minutes Intravenous  Once 03/20/19 1305 03/20/19 1521      Assessment/Plan: HTN HLD CAD, s/p stenting  Abdominal pain, diverticulitis vs pan colitis -somewhat improved today.  Con't NPO /Abx -mobilize  FEN - NPO x ice chips VTE - ok from our standpoint ID - [email or web address] 10/5 -->   LOS: 1 day    Ralene Ok 03/21/2019

## 2019-03-21 NOTE — Progress Notes (Signed)
Pt ambulated in hallway, O2 sat was 95%.

## 2019-03-21 NOTE — Progress Notes (Signed)
Hospitalist progress note  Andrew Solomon  [id number] DOB: [birth year] DOA: 03/20/2019 PCP: Harlan Stains, MD  Narrative:  68 year old male HTN, HLD, emphysema + prior smoker quit 2017, PCI + stent EF 2000-developed severe abdominal pain after eating some apple, watery diarrhea, nausea-emergency room work-up showed perforated diverticulitis-General surgery consulted-started on IV antibiotics  Assessment & Plan:   Diverticulitis + perforation versus pancolitis with sepsis as evidenced by hypotension and fever Continue IV saline 125 cc/H-mildly hypotensive but that seems to be improved-is less febrile with T-max only 100.3 as opposed to higher levels earlier-pain control Dilaudid 0.5-1 every 2 as needed-White count slightly elevated 13.2-monitor clinically for deterioration-would repeat labs p.m. AKI on admission, mild metabolic acidosis CO2 21 Mild rising creatinine-continue IV saline-repeat labs a.m. CAD status post stent 98 Continue aspirin 81 mg HLD lipid panel 08/05/2018 HDL 36 LDL 60.  Cont Crestor as OP Emphysema by h/o Can use supplemental oxygen if prn--desat screen Atherosclerosis   DVT prophylaxis: Loveneox  Code Status:   Full   Family Communication:   inpatient Disposition Plan:  none   Consultants:   Gen surgery  Procedures:   none  Antimicrobials:   IV zosyn since 10/5   Subjective: abd Pain improved Passing urine now-nursing reports was retaining-appears to have been place don oxygen overnight No cp  Objective: Vitals:   03/20/19 2058 03/20/19 2310 03/21/19 0058 03/21/19 0455  BP:   (!) 92/52 103/63  Pulse:   78 77  Resp:   18 18  Temp: (!) 100.6 F (38.1 C) 99.1 F (37.3 C) 100.3 F (37.9 C) 99.1 F (37.3 C)  TempSrc: Oral  Oral Oral  SpO2:   91% 95%  Weight:      Height:        Intake/Output Summary (Last 24 hours) at 03/21/2019 0829 Last data filed at 03/21/2019 0600 Gross per 24 hour  Intake 1782.06 ml  Output 900 ml  Net 882.06 ml    Filed Weights   03/20/19 1700  Weight: 79.1 kg    Examination: eomi ncat wm lookuing about stated age--in no distress Painful to sit up  cta b no adde dsound s1 s2 no m/r/g abd soft nt nd no rebound but tender in RLQ Neuro intact no focal deficit Finger amputaitons on R side noted ROM intact moving 4 limbs equally  Data Reviewed: I have personally reviewed following labs and imaging studies  CBC: Recent Labs  Lab 03/20/19 1006 03/21/19 0326  WBC 7.6 13.2*  NEUTROABS 5.9  --   HGB 13.4 12.6*  HCT 41.0 38.7*  MCV 91.9 92.8  PLT 258 0000000   Basic Metabolic Panel: Recent Labs  Lab 03/20/19 1006 03/21/19 0326  NA 137 138  K 4.1 4.2  CL 104 107  CO2 25 21*  GLUCOSE 99 95  BUN 16 17  CREATININE 1.18 1.26*  CALCIUM 9.0 8.0*   GFR: Estimated Creatinine Clearance: 51 mL/min (A) (by C-G formula based on SCr of 1.26 mg/dL (H)). Liver Function Tests: Recent Labs  Lab 03/20/19 1006 03/21/19 0326  AST 41 26  ALT 53* 37  ALKPHOS 56 45  BILITOT 0.5 1.1  PROT 7.4 6.2*  ALBUMIN 3.8 3.1*   Recent Labs  Lab 03/20/19 1006  LIPASE 29   No results for input(s): AMMONIA in the last 168 hours. Coagulation Profile: No results for input(s): INR, PROTIME in the last 168 hours. Cardiac Enzymes:  Radiology Studies: Reviewed images personally in health database   Scheduled Meds: .  aspirin EC  81 mg Oral Daily  . atenolol  25 mg Oral Daily  . enoxaparin (LOVENOX) injection  40 mg Subcutaneous Q24H  . pantoprazole (PROTONIX) IV  40 mg Intravenous Q24H  . sodium chloride flush  3 mL Intravenous Q12H  . traZODone  50 mg Oral QHS   Continuous Infusions: . sodium chloride 125 mL/hr at 03/21/19 0328  . piperacillin-tazobactam (ZOSYN)  IV 3.375 g (03/21/19 0327)     LOS: 1 day    Time spent: [place], MD [facility]  If 7PM-7AM, please contact night-coverage-look on AMION to find my number otherwise-prefer pages-not epic chat,please 03/21/2019, 8:29  AM

## 2019-03-22 LAB — CBC
HCT: 40.7 % (ref 39.0–52.0)
Hemoglobin: 13.3 g/dL (ref 13.0–17.0)
MCH: 30.7 pg (ref 26.0–34.0)
MCHC: 32.7 g/dL (ref 30.0–36.0)
MCV: 94 fL (ref 80.0–100.0)
Platelets: 248 10*3/uL (ref 150–400)
RBC: 4.33 MIL/uL (ref 4.22–5.81)
RDW: 12.8 % (ref 11.5–15.5)
WBC: 18.1 10*3/uL — ABNORMAL HIGH (ref 4.0–10.5)
nRBC: 0 % (ref 0.0–0.2)

## 2019-03-22 NOTE — Progress Notes (Signed)
Hospitalist progress note  Andrew Solomon  [id number] DOB: 01/22/1951 DOA: 03/20/2019 PCP: Harlan Stains, MD  Narrative:  68 year old male HTN, HLD, emphysema + prior smoker quit 2017, PCI + stent EF 2000-developed severe abdominal pain after eating some apple, watery diarrhea, nausea-emergency room work-up showed perforated diverticulitis-General surgery consulted-started on IV antibiotics  Assessment & Plan:   Diverticulitis + perforation versus pancolitis with sepsis as evidenced by hypotension and fever ???IV saline 125 cc-->40/H-mildly hypotensive--WBC up a little, continuing Zosyn-appears to might get CT scan a.m. after my discussion with Dr. Rosendo Gros rule out abscess collection-keep n.p.o. for now-pain is overall improved on Dilaudid AKI on admission, mild metabolic acidosis CO2 21 Mild rising creatinine-saline at 40-labs a.m. CAD status post stent 98 Continue aspirin 81 mg HLD lipid panel 08/05/2018 HDL 36 LDL 60.  Cont Crestor as OP Emphysema by h/o Can use supplemental oxygen if prn--desat screen-continue albuterol nebs 2.5 every 4 as needed Atherosclerosis, HTN Continue atenolol 25 daily  DVT prophylaxis: Loveneox  Code Status:   Full   Family Communication:   inpatient Disposition Plan:  none   Consultants:   Gen surgery  Procedures:   none  Antimicrobials:   IV zosyn since 10/5   Subjective: Overall pain is improved he feels better he is moving around somewhat-he was a little short of breath last night but this seems better without large amounts/rate of fluids Objective: Vitals:   03/21/19 2030 03/21/19 2039 03/22/19 0533 03/22/19 0710  BP: 110/88 130/69 (!) 151/75   Pulse:  81 84   Resp: 18 18 18    Temp: 98.6 F (37 C) 99.7 F (37.6 C) 99.9 F (37.7 C)   TempSrc: Oral Oral Oral   SpO2:  97% 100% 99%  Weight:      Height:        Intake/Output Summary (Last 24 hours) at 03/22/2019 0929 Last data filed at 03/22/2019 0818 Gross per 24 hour  Intake  3354.33 ml  Output 950 ml  Net 2404.33 ml   Filed Weights   03/20/19 1700  Weight: 79.1 kg    Examination:  Anicteric no pallor looks about stated age EOMI NCAT neck soft supple Chest bilaterally seems clear without added sound No wheeze Abdomen is soft slightly tender in right lower quadrant however improved from prior No lower extremity edema  Data Reviewed: I have personally reviewed following labs and imaging studies  CBC: Recent Labs  Lab 03/20/19 1006 03/21/19 0326 03/21/19 1213 03/22/19 0756  WBC 7.6 13.2* 14.9* 18.1*  NEUTROABS 5.9  --  12.2*  --   HGB 13.4 12.6* 12.4* 13.3  HCT 41.0 38.7* 38.5* 40.7  MCV 91.9 92.8 93.4 94.0  PLT 258 254 221 [id number]   Basic Metabolic Panel: Recent Labs  Lab 03/20/19 1006 03/21/19 0326 03/21/19 1212  NA 137 138 135  K 4.1 4.2 4.2  CL 104 107 106  CO2 25 21* 21*  GLUCOSE 99 95 88  BUN 16 17 22   CREATININE 1.18 1.26* 1.27*  CALCIUM 9.0 8.0* 7.7*  PHOS  --   --  3.1   GFR: Estimated Creatinine Clearance: 50.6 mL/min (A) (by C-G formula based on SCr of 1.27 mg/dL (H)). Liver Function Tests: Recent Labs  Lab 03/20/19 1006 03/21/19 0326 03/21/19 1212  AST 41 26  --   ALT 53* 37  --   ALKPHOS 56 45  --   BILITOT 0.5 1.1  --   PROT 7.4 6.2*  --   ALBUMIN 3.8 3.1*  3.0*   Recent Labs  Lab 03/20/19 1006  LIPASE 29   No results for input(s): AMMONIA in the last 168 hours. Coagulation Profile: No results for input(s): INR, PROTIME in the last 168 hours. Cardiac Enzymes:  Radiology Studies: Reviewed images personally in health database   Scheduled Meds: . aspirin EC  81 mg Oral Daily  . atenolol  25 mg Oral Daily  . enoxaparin (LOVENOX) injection  40 mg Subcutaneous Q24H  . pantoprazole (PROTONIX) IV  40 mg Intravenous Q24H  . sodium chloride flush  3 mL Intravenous Q12H  . traZODone  50 mg Oral QHS   Continuous Infusions: . sodium chloride 40 mL/hr at 03/21/19 1900  . piperacillin-tazobactam (ZOSYN)  IV  3.375 g (03/22/19 0304)     LOS: 2 days    Time spent: [place], MD [facility]  If 7PM-7AM, please contact night-coverage-look on AMION to find my number otherwise-prefer pages-not epic chat,please 03/22/2019, 9:29 AM

## 2019-03-22 NOTE — Progress Notes (Signed)
Subjective/Chief Complaint: Pt doing well today and less abd pain   Objective: Vital signs in last 24 hours: Temp:  [97.7 F (36.5 C)-100.3 F (37.9 C)] 99.9 F (37.7 C) (10/07 0533) Pulse Rate:  [75-84] 84 (10/07 0533) Resp:  [17-18] 18 (10/07 0533) BP: (101-151)/(60-88) 151/75 (10/07 0533) SpO2:  [95 %-100 %] 99 % (10/07 0710) Last BM Date: 03/20/19  Intake/Output from previous day: 10/06 0701 - 10/07 0700 In: 3354.3 [P.O.:1160; I.V.:2059; IV Piggyback:135.3] Out: 950 [Urine:950] Intake/Output this shift: Total I/O In: 0  Out: 200 [Urine:200]  Constitutional: No acute distress, conversant, appears states age. Eyes: Anicteric sclerae, moist conjunctiva, no lid lag Lungs: Clear to auscultation bilaterally, normal respiratory effort CV: regular rate and rhythm, no murmurs, no peripheral edema, pedal pulses 2+ GI: Soft, no masses or hepatosplenomegaly, tender to palpation, BLQ, no rebound/guarding Skin: No rashes, palpation reveals normal turgor Psychiatric: appropriate judgment and insight, oriented to person, place, and time   Lab Results:  Recent Labs    03/21/19 1213 03/22/19 0756  WBC 14.9* 18.1*  HGB 12.4* 13.3  HCT 38.5* 40.7  PLT 221 248   BMET Recent Labs    03/21/19 0326 03/21/19 1212  NA 138 135  K 4.2 4.2  CL 107 106  CO2 21* 21*  GLUCOSE 95 88  BUN 17 22  CREATININE 1.26* 1.27*  CALCIUM 8.0* 7.7*   Studies/Results: Ct Abdomen Pelvis W Contrast  Result Date: 03/20/2019 CLINICAL DATA:  Acute right-sided abdominal pain. EXAM: CT ABDOMEN AND PELVIS WITH CONTRAST TECHNIQUE: Multidetector CT imaging of the abdomen and pelvis was performed using the standard protocol following bolus administration of intravenous contrast. CONTRAST:  131mL OMNIPAQUE IOHEXOL 300 MG/ML  SOLN COMPARISON:  None. FINDINGS: Lower chest: No acute abnormality. Hepatobiliary: No gallstones or biliary dilatation is noted. Multiple hepatic low densities are noted most  consistent with cysts. Pancreas: Unremarkable. No pancreatic ductal dilatation or surrounding inflammatory changes. Spleen: Normal in size without focal abnormality. Adrenals/Urinary Tract: Adrenal glands are unremarkable. Kidneys are normal, without renal calculi, focal lesion, or hydronephrosis. Bladder is unremarkable. Stomach/Bowel: The stomach appears normal. The appendix appears normal. There appears to be substantial wall thickening involving the proximal sigmoid colon concerning for diverticulitis. There is seen some inflammatory changes and free fluid in the surrounding peritoneal fat superior to this portion of colon with some free air present suggesting small perforation. There is some dilatation and wall thickening of small bowel loops in this area suggesting secondary inflammation of the small bowel. Vascular/Lymphatic: Aortic atherosclerosis. No enlarged abdominal or pelvic lymph nodes. Reproductive: Mild prostatic enlargement is noted. Other: No hiatal hernia is noted. Musculoskeletal: No acute or significant osseous findings. IMPRESSION: Findings most consistent with proximal sigmoid diverticulitis, with inflammatory changes and free fluid in the surrounding peritoneal fat superior to this portion of the colon, with some amount of free air present suggesting small perforation. Inflammatory changes of adjacent small bowel loops are noted as well. Aortic Atherosclerosis (ICD10-I70.0). Electronically Signed   By: Marijo Conception M.D.   On: 03/20/2019 13:01   Dg Chest Port 1 View  Result Date: 03/21/2019 CLINICAL DATA:  Wheezing and unable to pre. EXAM: PORTABLE CHEST 1 VIEW COMPARISON:  December 03, 2015 FINDINGS: The mediastinal contour and cardiac silhouette are normal. Minimal atelectasis or scar of the left lung base are noted. Stable small calcified granuloma is identified in the right mid lung unchanged. There is no focal pneumonia or pulmonary edema. Bony structures are stable. IMPRESSION: No  focal pneumonia. Minimal atelectasis or scar of the left lung base. Electronically Signed   By: Abelardo Diesel M.D.   On: 03/21/2019 19:32    Anti-infectives: Anti-infectives (From admission, onward)   Start     Dose/Rate Route Frequency Ordered Stop   03/20/19 2000  piperacillin-tazobactam (ZOSYN) IVPB 3.375 g     3.375 g 12.5 mL/hr over 240 Minutes Intravenous Every 8 hours 03/20/19 1701     03/20/19 1500  piperacillin-tazobactam (ZOSYN) IVPB 3.375 g  Status:  Discontinued     3.375 g 100 mL/hr over 30 Minutes Intravenous Every 8 hours 03/20/19 1453 03/20/19 1701   03/20/19 1315  piperacillin-tazobactam (ZOSYN) IVPB 3.375 g     3.375 g 100 mL/hr over 30 Minutes Intravenous  Once 03/20/19 1305 03/20/19 1521      Assessment/Plan: HTN HLD CAD, s/p stenting  Abdominal pain, diverticulitis vs pan colitis -somewhat improved today.  Con't NPO /Abx -plan for CT Thurs -mobilize  FEN -NPO x ice chips VTE -ok from our standpoint ID -zosyn 10/5 -->   LOS: 2 days    Ralene Ok 03/22/2019

## 2019-03-23 ENCOUNTER — Inpatient Hospital Stay (HOSPITAL_COMMUNITY): Payer: Medicare Other

## 2019-03-23 LAB — CBC WITH DIFFERENTIAL/PLATELET
Abs Immature Granulocytes: 0 10*3/uL (ref 0.00–0.07)
Band Neutrophils: 12 %
Basophils Absolute: 0 10*3/uL (ref 0.0–0.1)
Basophils Relative: 0 %
Eosinophils Absolute: 0 10*3/uL (ref 0.0–0.5)
Eosinophils Relative: 0 %
HCT: 38.4 % — ABNORMAL LOW (ref 39.0–52.0)
Hemoglobin: 12.5 g/dL — ABNORMAL LOW (ref 13.0–17.0)
Lymphocytes Relative: 7 %
Lymphs Abs: 1.1 10*3/uL (ref 0.7–4.0)
MCH: 30.1 pg (ref 26.0–34.0)
MCHC: 32.6 g/dL (ref 30.0–36.0)
MCV: 92.5 fL (ref 80.0–100.0)
Monocytes Absolute: 0.3 10*3/uL (ref 0.1–1.0)
Monocytes Relative: 2 %
Neutro Abs: 14.7 10*3/uL — ABNORMAL HIGH (ref 1.7–7.7)
Neutrophils Relative %: 79 %
Platelets: 254 10*3/uL (ref 150–400)
RBC: 4.15 MIL/uL — ABNORMAL LOW (ref 4.22–5.81)
RDW: 12.9 % (ref 11.5–15.5)
WBC: 16.1 10*3/uL — ABNORMAL HIGH (ref 4.0–10.5)
nRBC: 0 % (ref 0.0–0.2)

## 2019-03-23 LAB — BASIC METABOLIC PANEL
Anion gap: 12 (ref 5–15)
BUN: 25 mg/dL — ABNORMAL HIGH (ref 8–23)
CO2: 20 mmol/L — ABNORMAL LOW (ref 22–32)
Calcium: 8.2 mg/dL — ABNORMAL LOW (ref 8.9–10.3)
Chloride: 105 mmol/L (ref 98–111)
Creatinine, Ser: 1.11 mg/dL (ref 0.61–1.24)
GFR calc Af Amer: >60 mL/min (ref >60)
GFR calc non Af Amer: >60 mL/min (ref >60)
Glucose, Bld: 87 mg/dL (ref 70–99)
Potassium: 3.8 mmol/L (ref 3.5–5.1)
Sodium: 137 mmol/L (ref 135–145)

## 2019-03-23 MED ORDER — IOHEXOL 300 MG/ML  SOLN
100.0000 mL | Freq: Once | INTRAMUSCULAR | Status: AC | PRN
  Administered 2019-03-23: 16:00:00 100 mL via INTRAVENOUS

## 2019-03-23 MED ORDER — IOHEXOL 300 MG/ML  SOLN
30.0000 mL | Freq: Once | INTRAMUSCULAR | Status: AC | PRN
  Administered 2019-03-23: 13:00:00 30 mL via ORAL

## 2019-03-23 MED ORDER — IPRATROPIUM-ALBUTEROL 0.5-2.5 (3) MG/3ML IN SOLN
3.0000 mL | Freq: Four times a day (QID) | RESPIRATORY_TRACT | Status: DC
  Administered 2019-03-23 – 2019-03-25 (×6): 3 mL via RESPIRATORY_TRACT
  Filled 2019-03-23 (×6): qty 3

## 2019-03-23 MED ORDER — SODIUM CHLORIDE (PF) 0.9 % IJ SOLN
INTRAMUSCULAR | Status: AC
  Filled 2019-03-23: qty 50

## 2019-03-23 NOTE — Significant Event (Signed)
CT A/P with new proximal small bowel obstruction at level of mid to distal ileum in right lower quadrant, worsening of sigmoid colonic diverticulitis with surrounding small foci of pneumoperitoneum, new multilocular fluid collections in the right lower quadrant concerning for pericolonic abscesses . Surgery on board. Notified on call surgeon of findings.

## 2019-03-23 NOTE — Progress Notes (Signed)
Subjective: CC: Patient reports that he still has some pain on his lower abdomen rating up to his epigastrium.  He notes that he is not passed any flatus or had a BM since Monday.  No nausea or vomiting.  He is ambulating.  Objective: Vital signs in last 24 hours: Temp:  [98.9 F (37.2 C)-99.1 F (37.3 C)] 98.9 F (37.2 C) (10/08 0535) Pulse Rate:  [71-78] 72 (10/08 0912) Resp:  [17-18] 18 (10/08 0535) BP: (102-134)/(57-70) 122/69 (10/08 0912) SpO2:  [91 %-98 %] 94 % (10/08 0843) Last BM Date: 03/20/19  Intake/Output from previous day: 10/07 0701 - 10/08 0700 In: 1132.9 [I.V.:984.5; IV Piggyback:148.4] Out: 1150 [Urine:1150] Intake/Output this shift: Total I/O In: 180 [P.O.:180] Out: 0   PE: Gen:  Alert, NAD, pleasant Abd: Soft, moderate distension with tenderness RLQ, RUQ and epigastrium. Hypoactive bowel sounds.  Lab Results:  Recent Labs    03/22/19 0756 03/23/19 0306  WBC 18.1* 16.1*  HGB 13.3 12.5*  HCT 40.7 38.4*  PLT 248 254   BMET Recent Labs    03/21/19 1212 03/23/19 0306  NA 135 137  K 4.2 3.8  CL 106 105  CO2 21* 20*  GLUCOSE 88 87  BUN 22 25*  CREATININE 1.27* 1.11  CALCIUM 7.7* 8.2*   PT/INR No results for input(s): LABPROT, INR in the last 72 hours. CMP     Component Value Date/Time   NA 137 03/23/2019 0306   K 3.8 03/23/2019 0306   CL 105 03/23/2019 0306   CO2 20 (L) 03/23/2019 0306   GLUCOSE 87 03/23/2019 0306   BUN 25 (H) 03/23/2019 0306   CREATININE 1.11 03/23/2019 0306   CALCIUM 8.2 (L) 03/23/2019 0306   PROT 6.2 (L) 03/21/2019 0326   PROT 6.9 08/05/2018 1431   ALBUMIN 3.0 (L) 03/21/2019 1212   ALBUMIN 4.5 08/05/2018 1431   AST 26 03/21/2019 0326   ALT 37 03/21/2019 0326   ALKPHOS 45 03/21/2019 0326   BILITOT 1.1 03/21/2019 0326   BILITOT 0.4 08/05/2018 1431   GFRNONAA >60 03/23/2019 0306   GFRAA >60 03/23/2019 0306   Lipase     Component Value Date/Time   LIPASE 29 03/20/2019 1006        Studies/Results: Dg Chest Port 1 View  Result Date: 03/21/2019 CLINICAL DATA:  Wheezing and unable to pre. EXAM: PORTABLE CHEST 1 VIEW COMPARISON:  December 03, 2015 FINDINGS: The mediastinal contour and cardiac silhouette are normal. Minimal atelectasis or scar of the left lung base are noted. Stable small calcified granuloma is identified in the right mid lung unchanged. There is no focal pneumonia or pulmonary edema. Bony structures are stable. IMPRESSION: No focal pneumonia. Minimal atelectasis or scar of the left lung base. Electronically Signed   By: Abelardo Diesel M.D.   On: 03/21/2019 19:32    Anti-infectives: Anti-infectives (From admission, onward)   Start     Dose/Rate Route Frequency Ordered Stop   03/20/19 2000  piperacillin-tazobactam (ZOSYN) IVPB 3.375 g     3.375 g 12.5 mL/hr over 240 Minutes Intravenous Every 8 hours 03/20/19 1701     03/20/19 1500  piperacillin-tazobactam (ZOSYN) IVPB 3.375 g  Status:  Discontinued     3.375 g 100 mL/hr over 30 Minutes Intravenous Every 8 hours 03/20/19 1453 03/20/19 1701   03/20/19 1315  piperacillin-tazobactam (ZOSYN) IVPB 3.375 g     3.375 g 100 mL/hr over 30 Minutes Intravenous  Once 03/20/19 1305 03/20/19 1521  Assessment/Plan HTN HLD CAD, s/p stenting  Abdominal pain, diverticulitis vs pan colitis - Appears to be developing an ileus. Continue bowel rest.  - Obtain CT scan for elevated wbc - Continue IV abx - Ambulate   FEN -NPO x ice chips VTE -Scds, Lovenox  ID -[email or web address] 10/5 -->   LOS: 3 days    Jillyn Ledger , [facility] Surgery 03/23/2019, 10:44 AM Pager: [phone]

## 2019-03-23 NOTE — Progress Notes (Signed)
Hospitalist progress note  Andrew Solomon  [id number] DOB: 1951-04-06 DOA: 03/20/2019 PCP: Harlan Stains, MD  Narrative:  68 year old male HTN, HLD, emphysema + prior smoker quit 2017, PCI + stent EF 2000-developed severe abdominal pain after eating some apple, watery diarrhea, nausea-emergency room work-up showed perforated diverticulitis-General surgery consulted-started on IV antibiotics  Assessment & Plan:   Diverticulitis + perforation versus pancolitis with sepsis as evidenced by hypotension and fever--improving  ? developing ileus per gen surg ? IV saline 125 cc-->40/H-cont Zosyn-WBC mildy better-Ct scan today pending-keep n.p.o. for now-pain is overall improved on Dilaudid 0.5-1 q2 prn AKI on admission, mild metabolic acidosis CO2 21 Equilibrating Creat-saline at 40-labs a.m. CAD status post stent 98 Continue aspirin 81 mg HLD lipid panel 08/05/2018 HDL 36 LDL 60.  Cont Crestor as OP Emphysema by h/o Can use supplemental oxygen if prn--desat screen-continue albuterol nebs 2.5 every 4 as needed-stable currently Atherosclerosis, HTN Continue atenolol 25 daily  DVT prophylaxis: Loveneox  Code Status:   Full   Family Communication: none +  inpatient Disposition Plan:  none   Consultants:   Gen surgery  Procedures:   none  Antimicrobials:   IV zosyn since 10/5   Subjective: oob some today-still npo-pai n in abd overall muc improved - flatus - stool - cp -cough - cold -rash  Objective: Vitals:   03/23/19 0839 03/23/19 0843 03/23/19 0912 03/23/19 1356  BP:   122/69 (!) 148/77  Pulse:   72 68  Resp:      Temp:    98.2 F (36.8 C)  TempSrc:      SpO2: 94% 94%  95%  Weight:      Height:        Intake/Output Summary (Last 24 hours) at 03/23/2019 1526 Last data filed at 03/23/2019 1400 Gross per 24 hour  Intake 1311.67 ml  Output 650 ml  Net 661.67 ml   Filed Weights   03/20/19 1700  Weight: 79.1 kg    Examination:  Anicteric no pallor looks about  stated age EOMI NCAT neck soft supple Chest bilaterally seems clear without added sound No wheeze Abdomen is soft less tender, but distended still No lower extremity edema  Data Reviewed: I have personally reviewed following labs and imaging studies  CBC: Recent Labs  Lab 03/20/19 1006 03/21/19 0326 03/21/19 1213 03/22/19 0756 03/23/19 0306  WBC 7.6 13.2* 14.9* 18.1* 16.1*  NEUTROABS 5.9  --  12.2*  --  14.7*  HGB 13.4 12.6* 12.4* 13.3 12.5*  HCT 41.0 38.7* 38.5* 40.7 38.4*  MCV 91.9 92.8 93.4 94.0 92.5  PLT 258 254 221 248 0000000   Basic Metabolic Panel: Recent Labs  Lab 03/20/19 1006 03/21/19 0326 03/21/19 1212 03/23/19 0306  NA 137 138 135 137  K 4.1 4.2 4.2 3.8  CL 104 107 106 105  CO2 25 21* 21* 20*  GLUCOSE 99 95 88 87  BUN 16 17 22  25*  CREATININE 1.18 1.26* 1.27* 1.11  CALCIUM 9.0 8.0* 7.7* 8.2*  PHOS  --   --  3.1  --    GFR: Estimated Creatinine Clearance: 57.9 mL/min (by C-G formula based on SCr of 1.11 mg/dL). Liver Function Tests: Recent Labs  Lab 03/20/19 1006 03/21/19 0326 03/21/19 1212  AST 41 26  --   ALT 53* 37  --   ALKPHOS 56 45  --   BILITOT 0.5 1.1  --   PROT 7.4 6.2*  --   ALBUMIN 3.8 3.1* 3.0*   Recent  Labs  Lab 03/20/19 1006  LIPASE 29   No results for input(s): AMMONIA in the last 168 hours. Coagulation Profile: No results for input(s): INR, PROTIME in the last 168 hours. Cardiac Enzymes:  Radiology Studies: Reviewed images personally in health database   Scheduled Meds: . aspirin EC  81 mg Oral Daily  . atenolol  25 mg Oral Daily  . enoxaparin (LOVENOX) injection  40 mg Subcutaneous Q24H  . pantoprazole (PROTONIX) IV  40 mg Intravenous Q24H  . sodium chloride flush  3 mL Intravenous Q12H  . traZODone  50 mg Oral QHS   Continuous Infusions: . sodium chloride 40 mL/hr at 03/22/19 1956  . piperacillin-tazobactam (ZOSYN)  IV 3.375 g (03/23/19 1117)     LOS: 3 days    Time spent: [place], MD [facility]  If 7PM-7AM, please contact night-coverage-look on AMION to find my number otherwise-prefer pages-not epic chat,please 03/23/2019, 3:26 PM

## 2019-03-23 NOTE — Care Management Important Message (Signed)
Important Message  Patient Details IM Letter given to Marlou Starks SW to present to the [place] Name: Andrew Solomon MRN: [id number] Date of Birth: 1951-01-20   Medicare Important Message Given:  Yes     Kerin Salen 03/23/2019, 9:33 AM

## 2019-03-24 LAB — BASIC METABOLIC PANEL
Anion gap: 10 (ref 5–15)
BUN: 23 mg/dL (ref 8–23)
CO2: 23 mmol/L (ref 22–32)
Calcium: 8.2 mg/dL — ABNORMAL LOW (ref 8.9–10.3)
Chloride: 104 mmol/L (ref 98–111)
Creatinine, Ser: 1.03 mg/dL (ref 0.61–1.24)
GFR calc Af Amer: >60 mL/min (ref >60)
GFR calc non Af Amer: >60 mL/min (ref >60)
Glucose, Bld: 82 mg/dL (ref 70–99)
Potassium: 3.6 mmol/L (ref 3.5–5.1)
Sodium: 137 mmol/L (ref 135–145)

## 2019-03-24 LAB — CBC WITH DIFFERENTIAL/PLATELET
Abs Immature Granulocytes: 0.19 10*3/uL — ABNORMAL HIGH (ref 0.00–0.07)
Basophils Absolute: 0.1 10*3/uL (ref 0.0–0.1)
Basophils Relative: 0 %
Eosinophils Absolute: 0.3 10*3/uL (ref 0.0–0.5)
Eosinophils Relative: 2 %
HCT: 35.5 % — ABNORMAL LOW (ref 39.0–52.0)
Hemoglobin: 11.6 g/dL — ABNORMAL LOW (ref 13.0–17.0)
Immature Granulocytes: 1 %
Lymphocytes Relative: 8 %
Lymphs Abs: 1.1 10*3/uL (ref 0.7–4.0)
MCH: 30.4 pg (ref 26.0–34.0)
MCHC: 32.7 g/dL (ref 30.0–36.0)
MCV: 92.9 fL (ref 80.0–100.0)
Monocytes Absolute: 1.1 10*3/uL — ABNORMAL HIGH (ref 0.1–1.0)
Monocytes Relative: 8 %
Neutro Abs: 11.3 10*3/uL — ABNORMAL HIGH (ref 1.7–7.7)
Neutrophils Relative %: 81 %
Platelets: 275 10*3/uL (ref 150–400)
RBC: 3.82 MIL/uL — ABNORMAL LOW (ref 4.22–5.81)
RDW: 13 % (ref 11.5–15.5)
WBC: 14 10*3/uL — ABNORMAL HIGH (ref 4.0–10.5)
nRBC: 0 % (ref 0.0–0.2)

## 2019-03-24 MED ORDER — SODIUM CHLORIDE 0.9 % IV SOLN
1.0000 g | INTRAVENOUS | Status: DC
  Administered 2019-03-24 – 2019-04-03 (×11): 1000 mg via INTRAVENOUS
  Filled 2019-03-24 (×11): qty 1

## 2019-03-24 NOTE — Progress Notes (Signed)
Patient ID: SHAHZEB BAYAT, male   DOB: [birth year], 68 y.o.   MRN: [id number] Request received for consideration of abdominal fluid collection drainage in pt. Imaging studies have been reviewed by Dr. Laurence Ferrari and there is nothing amenable to drain percutaneously at this time. Recommend continued antibiotic therapy and reimaging on 10/12.  Please contact Dr. Laurence Ferrari at [phone] with any additional questions.

## 2019-03-24 NOTE — Progress Notes (Addendum)
Hospitalist progress note  Andrew Solomon  [id number] DOB: 04-23-1951 DOA: 03/20/2019 PCP: Harlan Stains, MD  Narrative:  68 year old male HTN, HLD, emphysema + prior smoker quit 2017, PCI + stent EF 2000-developed severe abdominal pain after eating some apple, watery diarrhea, nausea-emergency room work-up showed perforated diverticulitis-General surgery consulted-started on IV antibiotics  Assessment & Plan:   Diverticulitis + perforation-Ct 10/9 showing 3.6 cm abcess IV saline 40/H-cont Zosyn-pain is overall improved on Dilaudid 0.5-1 q2 prn Defer decisions re: imaging and/or surgery to gen surg--IR has see, nothing amenable to perc drain Rpt AXR in am--if has severe pain at any time get stat 1 vw film AKI on admission, mild metabolic acidosis CO2 21 Equilibrating Creat-saline 40cc/h-labs a.m. CAD status post stent 98 Continue aspirin 81 mg HLD lipid panel 08/05/2018 HDL 36 LDL 60.  Cont Crestor as OP Emphysema by h/o  supplemental oxygen if prn--desat screen-continue albuterol nebs 2.5 every 4 as needed-stable currently Atherosclerosis, HTN Continue atenolol 25 daily  DVT prophylaxis: Loveneox  Code Status:   Full   Family Communication: none +  inpatient Disposition Plan:  none   Consultants:   Gen surgery  Procedures:   none  Antimicrobials:   IV zosyn since 10/5   Subjective:  More mobile pain in abd 5/10 at worst-helped by dilaudid -cp -n -f -v -cougg -cold + 1 brown stool today  Objective: Vitals:   03/24/19 0735 03/24/19 0740 03/24/19 1404 03/24/19 1408  BP:   127/76   Pulse:   70   Resp:   17   Temp:      TempSrc:      SpO2: 94% 94% 95% 95%  Weight:      Height:        Intake/Output Summary (Last 24 hours) at 03/24/2019 1429 Last data filed at 03/24/2019 1404 Gross per 24 hour  Intake 1066.32 ml  Output 575 ml  Net 491.32 ml   Filed Weights   03/20/19 1700  Weight: 79.1 kg    Examination:  Anicteric no pallor anicteric EOMI NCAT neck  soft supple Chest bilaterally seems clear without added sound No wheeze Abdomen is soft less tender, less distended in LQ No lower extremity edema Neuro intact no focal deficit  Data Reviewed: I have personally reviewed following labs and imaging studies  CBC: Recent Labs  Lab 03/20/19 1006 03/21/19 0326 03/21/19 1213 03/22/19 0756 03/23/19 0306 03/24/19 0253  WBC 7.6 13.2* 14.9* 18.1* 16.1* 14.0*  NEUTROABS 5.9  --  12.2*  --  14.7* 11.3*  HGB 13.4 12.6* 12.4* 13.3 12.5* 11.6*  HCT 41.0 38.7* 38.5* 40.7 38.4* 35.5*  MCV 91.9 92.8 93.4 94.0 92.5 92.9  PLT 258 254 221 248 254 [id number]   Basic Metabolic Panel: Recent Labs  Lab 03/20/19 1006 03/21/19 0326 03/21/19 1212 03/23/19 0306 03/24/19 0253  NA 137 138 135 137 137  K 4.1 4.2 4.2 3.8 3.6  CL 104 107 106 105 104  CO2 25 21* 21* 20* 23  GLUCOSE 99 95 88 87 82  BUN 16 17 22  25* 23  CREATININE 1.18 1.26* 1.27* 1.11 1.03  CALCIUM 9.0 8.0* 7.7* 8.2* 8.2*  PHOS  --   --  3.1  --   --    GFR: Estimated Creatinine Clearance: 62.4 mL/min (by C-G formula based on SCr of 1.03 mg/dL). Liver Function Tests: Recent Labs  Lab 03/20/19 1006 03/21/19 0326 03/21/19 1212  AST 41 26  --   ALT 53* 37  --  ALKPHOS 56 45  --   BILITOT 0.5 1.1  --   PROT 7.4 6.2*  --   ALBUMIN 3.8 3.1* 3.0*   Recent Labs  Lab 03/20/19 1006  LIPASE 29   No results for input(s): AMMONIA in the last 168 hours. Coagulation Profile: No results for input(s): INR, PROTIME in the last 168 hours. Cardiac Enzymes:  Radiology Studies: Reviewed images personally in health database   Scheduled Meds: . aspirin EC  81 mg Oral Daily  . atenolol  25 mg Oral Daily  . enoxaparin (LOVENOX) injection  40 mg Subcutaneous Q24H  . ipratropium-albuterol  3 mL Nebulization Q6H  . pantoprazole (PROTONIX) IV  40 mg Intravenous Q24H  . sodium chloride flush  3 mL Intravenous Q12H  . traZODone  50 mg Oral QHS   Continuous Infusions: . sodium chloride 40 mL/hr  at 03/23/19 1939  . ertapenem 1,000 mg (03/24/19 0845)     LOS: 4 days    Time spent: [place], MD [facility]  If 7PM-7AM, please contact night-coverage-look on AMION to find my number otherwise-prefer pages-not epic chat,please 03/24/2019, 2:29 PM

## 2019-03-24 NOTE — Progress Notes (Addendum)
Subjective: CC: Abdominal pain Patient reports no change in pain from yesterday. He continues to have pain in his RLQ and feels distended. Still no flatus or BM since Monday. No N/V. He is ambulating in his room but not in the halls. Patient let me know that his GI doctor is Dr. Collene Mares, and asked if she could be involved in his case.   Objective: Vital signs in last 24 hours: Temp:  [98.1 F (36.7 C)-98.5 F (36.9 C)] 98.1 F (36.7 C) (10/09 0550) Pulse Rate:  [67-76] 76 (10/09 0550) Resp:  [14-15] 15 (10/09 0550) BP: (122-148)/(67-77) 144/76 (10/09 0550) SpO2:  [93 %-99 %] 94 % (10/09 0740) Last BM Date: 03/20/19  Intake/Output from previous day: 10/08 0701 - 10/09 0700 In: 1315 [P.O.:480; I.V.:768.6; IV Piggyback:66.4] Out: 575 [Urine:575] Intake/Output this shift: No intake/output data recorded.  PE: Gen:  Alert, NAD, pleasant Lungs: Normal rate and effort  Abd: Soft, moderate distension with tenderness suprapubic and RLQ without peritonitis. Hypoactive bowel sounds. Msk: No LE edema   Lab Results:  Recent Labs    03/23/19 0306 03/24/19 0253  WBC 16.1* 14.0*  HGB 12.5* 11.6*  HCT 38.4* 35.5*  PLT 254 275   BMET Recent Labs    03/23/19 0306 03/24/19 0253  NA 137 137  K 3.8 3.6  CL 105 104  CO2 20* 23  GLUCOSE 87 82  BUN 25* 23  CREATININE 1.11 1.03  CALCIUM 8.2* 8.2*   PT/INR No results for input(s): LABPROT, INR in the last 72 hours. CMP     Component Value Date/Time   NA 137 03/24/2019 0253   K 3.6 03/24/2019 0253   CL 104 03/24/2019 0253   CO2 23 03/24/2019 0253   GLUCOSE 82 03/24/2019 0253   BUN 23 03/24/2019 0253   CREATININE 1.03 03/24/2019 0253   CALCIUM 8.2 (L) 03/24/2019 0253   PROT 6.2 (L) 03/21/2019 0326   PROT 6.9 08/05/2018 1431   ALBUMIN 3.0 (L) 03/21/2019 1212   ALBUMIN 4.5 08/05/2018 1431   AST 26 03/21/2019 0326   ALT 37 03/21/2019 0326   ALKPHOS 45 03/21/2019 0326   BILITOT 1.1 03/21/2019 0326   BILITOT 0.4  08/05/2018 1431   GFRNONAA >60 03/24/2019 0253   GFRAA >60 03/24/2019 0253   Lipase     Component Value Date/Time   LIPASE 29 03/20/2019 1006       Studies/Results: Ct Abdomen Pelvis W Contrast  Result Date: 03/23/2019 CLINICAL DATA:  Diverticulitis, hypotension fever EXAM: CT ABDOMEN AND PELVIS WITH CONTRAST TECHNIQUE: Multidetector CT imaging of the abdomen and pelvis was performed using the standard protocol following bolus administration of intravenous contrast. CONTRAST:  181mL OMNIPAQUE IOHEXOL 300 MG/ML SOLN, 34mL OMNIPAQUE IOHEXOL 300 MG/ML SOLN COMPARISON:  March 20, 2019 FINDINGS: Lower chest: There is mild cardiomegaly. There is trace bilateral pleural effusion. Adjacent compressive atelectasis seen at the posterior left lung base. Hepatobiliary: Again noted are multiple tiny hypodense lesions throughout the liver parenchyma the largest in the anterior left liver lobe measuring 1.3 cm.The main portal vein is patent. No evidence of calcified gallstones, gallbladder wall thickening or biliary dilatation. Pancreas: Unremarkable. No pancreatic ductal dilatation or surrounding inflammatory changes. Spleen: Normal in size without focal abnormality. Adrenals/Urinary Tract: Both adrenal glands appear normal. The kidneys and collecting system appear normal without evidence of urinary tract calculus or hydronephrosis. Bladder is unremarkable. Stomach/Bowel: The stomach is contrast filled and dilated. There is a mildly dilated esophagus with contrast  seen in the lower chest. There is dilated air and fluid-filled loops of proximal small bowel measuring up to 3.6 cm to the level of the mid ileum in the right mid abdomen where there is a focal area of narrowing. There is diffuse bowel wall thickening surrounding mesenteric fat stranding changes in this region. The distal ileal loops are decompressed. Again noted is diffuse wall thickening of the sigmoid colon with small foci of free air seen superior  to the sigmoid colon, series 2, image 68, which appears worsened since the prior exam. There are now several multilocular peripherally enhancing fluid collection seen within the right lower abdomen extending superiorly adjacent to the sigmoid colon and ileal loops the largest of which measures approximately 3.5 cm on series 2, image 65. Vascular/Lymphatic: There are no enlarged mesenteric, retroperitoneal, or pelvic lymph nodes. Scattered aortic atherosclerotic calcifications are seen without aneurysmal dilatation. Reproductive: The prostate is unremarkable. Other: No evidence of abdominal wall mass or hernia. Small amount of perihepatic ascites is seen. Musculoskeletal: No acute or significant osseous findings. IMPRESSION: 1. New proximal partial small bowel obstruction to the level of the mid to distal ileum in the right lower quadrant where there is a focal area of narrowing with ileal wall thickening and inflammatory changes. 2. Interval worsening of the sigmoid colonic diverticulitis with surrounding small foci of pneumoperitoneum. There is also new multilocular fluid collections in the right lower quadrant, likely consistent with intra-abdominal pericolonic abscesses, the largest measuring 3.5 cm. 3. Small amount of perihepatic ascites 4. Trace bilateral pleural effusions. These results were called by telephone at the time of interpretation on 03/23/19 at 8:00 pm to provider Donia Guiles, who verbally acknowledged these results. Electronically Signed   By: Prudencio Pair M.D.   On: 03/23/2019 20:00    Anti-infectives: Anti-infectives (From admission, onward)   Start     Dose/Rate Route Frequency Ordered Stop   03/24/19 0800  ertapenem (INVANZ) 1,000 mg in sodium chloride 0.9 % 100 mL IVPB     1 g 200 mL/hr over 30 Minutes Intravenous Every 24 hours 03/24/19 0738     03/20/19 2000  piperacillin-tazobactam (ZOSYN) IVPB 3.375 g  Status:  Discontinued     3.375 g 12.5 mL/hr over 240 Minutes  Intravenous Every 8 hours 03/20/19 1701 03/24/19 0739   03/20/19 1500  piperacillin-tazobactam (ZOSYN) IVPB 3.375 g  Status:  Discontinued     3.375 g 100 mL/hr over 30 Minutes Intravenous Every 8 hours 03/20/19 1453 03/20/19 1701   03/20/19 1315  piperacillin-tazobactam (ZOSYN) IVPB 3.375 g     3.375 g 100 mL/hr over 30 Minutes Intravenous  Once 03/20/19 1305 03/20/19 1521       Assessment/Plan HTN HLD CAD, s/p stenting  Diverticulitis with abscess Ileus likely 2/2 Diverticulitis  - CT 10/8 with ileus and worsening sigmoid diverticulitis with multilocular fluid collections in the RLQ measuring 3.5cm at their largest - IR consult for possible drainage - Switch abx from Zosyn to [place]  - Continue bowel rest, IVF - Ambulate   FEN -NPO  VTE -Scds, Lovenox  ID -zosyn 10/5 -10/9. Invanz 10/9>>  Plan: Change abx to Invanz. Consult IR for possible drainage of abscess. Continue bowel rest. Hopefully will be able to make it through hospitalization without surgery, but we discussed the possibility of surgery if he does not improve.   LOS: 4 days    Jillyn Ledger , [facility] Surgery 03/24/2019, 8:54 AM Pager: [phone]

## 2019-03-25 LAB — CBC WITH DIFFERENTIAL/PLATELET
Abs Immature Granulocytes: 0.33 10*3/uL — ABNORMAL HIGH (ref 0.00–0.07)
Basophils Absolute: 0.1 10*3/uL (ref 0.0–0.1)
Basophils Relative: 0 %
Eosinophils Absolute: 0.2 10*3/uL (ref 0.0–0.5)
Eosinophils Relative: 1 %
HCT: 34.7 % — ABNORMAL LOW (ref 39.0–52.0)
Hemoglobin: 11.1 g/dL — ABNORMAL LOW (ref 13.0–17.0)
Immature Granulocytes: 2 %
Lymphocytes Relative: 8 %
Lymphs Abs: 1.1 10*3/uL (ref 0.7–4.0)
MCH: 29.8 pg (ref 26.0–34.0)
MCHC: 32 g/dL (ref 30.0–36.0)
MCV: 93.3 fL (ref 80.0–100.0)
Monocytes Absolute: 1.5 10*3/uL — ABNORMAL HIGH (ref 0.1–1.0)
Monocytes Relative: 10 %
Neutro Abs: 11.1 10*3/uL — ABNORMAL HIGH (ref 1.7–7.7)
Neutrophils Relative %: 79 %
Platelets: 297 10*3/uL (ref 150–400)
RBC: 3.72 MIL/uL — ABNORMAL LOW (ref 4.22–5.81)
RDW: 13 % (ref 11.5–15.5)
WBC: 14.3 10*3/uL — ABNORMAL HIGH (ref 4.0–10.5)
nRBC: 0 % (ref 0.0–0.2)

## 2019-03-25 LAB — RENAL FUNCTION PANEL
Albumin: 2.5 g/dL — ABNORMAL LOW (ref 3.5–5.0)
Anion gap: 12 (ref 5–15)
BUN: 19 mg/dL (ref 8–23)
CO2: 24 mmol/L (ref 22–32)
Calcium: 8.1 mg/dL — ABNORMAL LOW (ref 8.9–10.3)
Chloride: 100 mmol/L (ref 98–111)
Creatinine, Ser: 1.04 mg/dL (ref 0.61–1.24)
GFR calc Af Amer: >60 mL/min (ref >60)
GFR calc non Af Amer: >60 mL/min (ref >60)
Glucose, Bld: 82 mg/dL (ref 70–99)
Phosphorus: 2 mg/dL — ABNORMAL LOW (ref 2.5–4.6)
Potassium: 3.4 mmol/L — ABNORMAL LOW (ref 3.5–5.1)
Sodium: 136 mmol/L (ref 135–145)

## 2019-03-25 LAB — CULTURE, BLOOD (ROUTINE X 2)
Culture: NO GROWTH
Culture: NO GROWTH
Special Requests: ADEQUATE
Special Requests: ADEQUATE

## 2019-03-25 NOTE — Progress Notes (Signed)
Hospitalist progress note  Andrew Solomon  [id number] DOB: 22-Oct-1950 DOA: 03/20/2019 PCP: Harlan Stains, MD  Narrative:  68 year old male HTN, HLD, emphysema + prior smoker quit 2017, PCI + stent EF 2000-developed severe abdominal pain after eating some apple, watery diarrhea, nausea-emergency room work-up showed perforated diverticulitis-General surgery consulted-started on IV antibiotics  Assessment & Plan:   Diverticulitis + perforation-Ct 10/9 showing 3.6 cm abcess IV saline 40/H-Zosyn changed 10/10 to Invanz-pain is overall improved on Dilaudid 0.5-1 q2 prn No role currently for surgery-monitor  Advanced to FLD per gen surg 10/10 Might need rpt CT in several days to compare and contrast size of 3.6 cm abscess-deferring to gen surg Wbc remains 14 AKI on admission,  Equilibrating Creat-saline 40cc/h-labs a.m. Acidosis resolved CAD status post stent 98 Continue aspirin 81 mg HLD lipid panel 08/05/2018 HDL 36 LDL 60.  Cont Crestor as OP Emphysema by h/o supplemental oxygen if prn--desat screen-continue albuterol nebs 2.5 q4 Atherosclerosis, HTN Continue atenolol 25 daily  DVT prophylaxis: Loveneox  Code Status:   Full   Family Communication: none +  inpatient Disposition Plan:  none   Consultants:   Gen surgery  Procedures:   none  Antimicrobials:   IV zosyn since 10/5   Subjective:  Overall improved Multiple stool Pain controlled but asking for IV meds q2  Objective: Vitals:   03/24/19 2102 03/24/19 2112 03/25/19 0257 03/25/19 0630  BP: 133/71   (!) 116/50  Pulse: 87   73  Resp: 20   20  Temp: 99.8 F (37.7 C)   99.1 F (37.3 C)  TempSrc: Oral   Oral  SpO2: 93% 91% 90% 90%  Weight:      Height:        Intake/Output Summary (Last 24 hours) at 03/25/2019 1324 Last data filed at 03/25/2019 1000 Gross per 24 hour  Intake 1445.59 ml  Output 50 ml  Net 1395.59 ml   Filed Weights   03/20/19 1700  Weight: 79.1 kg    Examination:  Awake coherent  abd soft slight distended Neuro intact s1 s2 no m  Data Reviewed: I have personally reviewed following labs and imaging studies  CBC: Recent Labs  Lab 03/20/19 1006  03/21/19 1213 03/22/19 0756 03/23/19 0306 03/24/19 0253 03/25/19 0419  WBC 7.6   < > 14.9* 18.1* 16.1* 14.0* 14.3*  NEUTROABS 5.9  --  12.2*  --  14.7* 11.3* 11.1*  HGB 13.4   < > 12.4* 13.3 12.5* 11.6* 11.1*  HCT 41.0   < > 38.5* 40.7 38.4* 35.5* 34.7*  MCV 91.9   < > 93.4 94.0 92.5 92.9 93.3  PLT 258   < > 221 248 254 275 297   < > = values in this interval not displayed.   Basic Metabolic Panel: Recent Labs  Lab 03/21/19 0326 03/21/19 1212 03/23/19 0306 03/24/19 0253 03/25/19 0419  NA 138 135 137 137 136  K 4.2 4.2 3.8 3.6 3.4*  CL 107 106 105 104 100  CO2 21* 21* 20* 23 24  GLUCOSE 95 88 87 82 82  BUN 17 22 25* 23 19  CREATININE 1.26* 1.27* 1.11 1.03 1.04  CALCIUM 8.0* 7.7* 8.2* 8.2* 8.1*  PHOS  --  3.1  --   --  2.0*   GFR: Estimated Creatinine Clearance: 61.8 mL/min (by C-G formula based on SCr of 1.04 mg/dL). Liver Function Tests: Recent Labs  Lab 03/20/19 1006 03/21/19 0326 03/21/19 1212 03/25/19 0419  AST 41 26  --   --  ALT 53* 37  --   --   ALKPHOS 56 45  --   --   BILITOT 0.5 1.1  --   --   PROT 7.4 6.2*  --   --   ALBUMIN 3.8 3.1* 3.0* 2.5*   Recent Labs  Lab 03/20/19 1006  LIPASE 29   No results for input(s): AMMONIA in the last 168 hours. Coagulation Profile: No results for input(s): INR, PROTIME in the last 168 hours. Cardiac Enzymes:  Radiology Studies: Reviewed images personally in health database   Scheduled Meds: . aspirin EC  81 mg Oral Daily  . atenolol  25 mg Oral Daily  . enoxaparin (LOVENOX) injection  40 mg Subcutaneous Q24H  . pantoprazole (PROTONIX) IV  40 mg Intravenous Q24H  . sodium chloride flush  3 mL Intravenous Q12H  . traZODone  50 mg Oral QHS   Continuous Infusions: . sodium chloride 40 mL/hr at 03/24/19 1802  . ertapenem 1,000 mg  (03/25/19 0752)     LOS: 5 days    Time spent: [place], MD [facility]  If 7PM-7AM, please contact night-coverage-look on AMION to find my number otherwise-prefer pages-not epic chat,please 03/25/2019, 1:24 PM

## 2019-03-25 NOTE — Progress Notes (Signed)
Subjective: CC: Patient reports much less pain.  He is having BM's.  No nausea  Objective: Vital signs in last 24 hours: Temp:  [99.1 F (37.3 C)-99.8 F (37.7 C)] 99.1 F (37.3 C) (10/10 0630) Pulse Rate:  [70-87] 73 (10/10 0630) Resp:  [17-20] 20 (10/10 0630) BP: (116-133)/(50-76) 116/50 (10/10 0630) SpO2:  [90 %-95 %] 90 % (10/10 0630) Last BM Date: 03/24/19  Intake/Output from previous day: 10/09 0701 - 10/10 0700 In: 1082.3 [P.O.:60; I.V.:922.3; IV Piggyback:100] Out: 50 [Urine:50] Intake/Output this shift: No intake/output data recorded.  PE: Gen:  Alert, NAD, pleasant Abd: Soft, moderate distension with min tenderness  Lab Results:  Recent Labs    03/24/19 0253 03/25/19 0419  WBC 14.0* 14.3*  HGB 11.6* 11.1*  HCT 35.5* 34.7*  PLT 275 297   BMET Recent Labs    03/24/19 0253 03/25/19 0419  NA 137 136  K 3.6 3.4*  CL 104 100  CO2 23 24  GLUCOSE 82 82  BUN 23 19  CREATININE 1.03 1.04  CALCIUM 8.2* 8.1*   PT/INR No results for input(s): LABPROT, INR in the last 72 hours. CMP     Component Value Date/Time   NA 136 03/25/2019 0419   K 3.4 (L) 03/25/2019 0419   CL 100 03/25/2019 0419   CO2 24 03/25/2019 0419   GLUCOSE 82 03/25/2019 0419   BUN 19 03/25/2019 0419   CREATININE 1.04 03/25/2019 0419   CALCIUM 8.1 (L) 03/25/2019 0419   PROT 6.2 (L) 03/21/2019 0326   PROT 6.9 08/05/2018 1431   ALBUMIN 2.5 (L) 03/25/2019 0419   ALBUMIN 4.5 08/05/2018 1431   AST 26 03/21/2019 0326   ALT 37 03/21/2019 0326   ALKPHOS 45 03/21/2019 0326   BILITOT 1.1 03/21/2019 0326   BILITOT 0.4 08/05/2018 1431   GFRNONAA >60 03/25/2019 0419   GFRAA >60 03/25/2019 0419   Lipase     Component Value Date/Time   LIPASE 29 03/20/2019 1006       Studies/Results: Ct Abdomen Pelvis W Contrast  Result Date: 03/23/2019 CLINICAL DATA:  Diverticulitis, hypotension fever EXAM: CT ABDOMEN AND PELVIS WITH CONTRAST TECHNIQUE: Multidetector CT imaging of the  abdomen and pelvis was performed using the standard protocol following bolus administration of intravenous contrast. CONTRAST:  158mL OMNIPAQUE IOHEXOL 300 MG/ML SOLN, 45mL OMNIPAQUE IOHEXOL 300 MG/ML SOLN COMPARISON:  March 20, 2019 FINDINGS: Lower chest: There is mild cardiomegaly. There is trace bilateral pleural effusion. Adjacent compressive atelectasis seen at the posterior left lung base. Hepatobiliary: Again noted are multiple tiny hypodense lesions throughout the liver parenchyma the largest in the anterior left liver lobe measuring 1.3 cm.The main portal vein is patent. No evidence of calcified gallstones, gallbladder wall thickening or biliary dilatation. Pancreas: Unremarkable. No pancreatic ductal dilatation or surrounding inflammatory changes. Spleen: Normal in size without focal abnormality. Adrenals/Urinary Tract: Both adrenal glands appear normal. The kidneys and collecting system appear normal without evidence of urinary tract calculus or hydronephrosis. Bladder is unremarkable. Stomach/Bowel: The stomach is contrast filled and dilated. There is a mildly dilated esophagus with contrast seen in the lower chest. There is dilated air and fluid-filled loops of proximal small bowel measuring up to 3.6 cm to the level of the mid ileum in the right mid abdomen where there is a focal area of narrowing. There is diffuse bowel wall thickening surrounding mesenteric fat stranding changes in this region. The distal ileal loops are decompressed. Again noted is diffuse wall thickening  of the sigmoid colon with small foci of free air seen superior to the sigmoid colon, series 2, image 68, which appears worsened since the prior exam. There are now several multilocular peripherally enhancing fluid collection seen within the right lower abdomen extending superiorly adjacent to the sigmoid colon and ileal loops the largest of which measures approximately 3.5 cm on series 2, image 65. Vascular/Lymphatic: There are no  enlarged mesenteric, retroperitoneal, or pelvic lymph nodes. Scattered aortic atherosclerotic calcifications are seen without aneurysmal dilatation. Reproductive: The prostate is unremarkable. Other: No evidence of abdominal wall mass or hernia. Small amount of perihepatic ascites is seen. Musculoskeletal: No acute or significant osseous findings. IMPRESSION: 1. New proximal partial small bowel obstruction to the level of the mid to distal ileum in the right lower quadrant where there is a focal area of narrowing with ileal wall thickening and inflammatory changes. 2. Interval worsening of the sigmoid colonic diverticulitis with surrounding small foci of pneumoperitoneum. There is also new multilocular fluid collections in the right lower quadrant, likely consistent with intra-abdominal pericolonic abscesses, the largest measuring 3.5 cm. 3. Small amount of perihepatic ascites 4. Trace bilateral pleural effusions. These results were called by telephone at the time of interpretation on 03/23/19 at 8:00 pm to provider Donia Guiles, who verbally acknowledged these results. Electronically Signed   By: Prudencio Pair M.D.   On: 03/23/2019 20:00    Anti-infectives: Anti-infectives (From admission, onward)   Start     Dose/Rate Route Frequency Ordered Stop   03/24/19 0800  ertapenem (INVANZ) 1,000 mg in sodium chloride 0.9 % 100 mL IVPB     1 g 200 mL/hr over 30 Minutes Intravenous Every 24 hours 03/24/19 0738     03/20/19 2000  piperacillin-tazobactam (ZOSYN) IVPB 3.375 g  Status:  Discontinued     3.375 g 12.5 mL/hr over 240 Minutes Intravenous Every 8 hours 03/20/19 1701 03/24/19 0739   03/20/19 1500  piperacillin-tazobactam (ZOSYN) IVPB 3.375 g  Status:  Discontinued     3.375 g 100 mL/hr over 30 Minutes Intravenous Every 8 hours 03/20/19 1453 03/20/19 1701   03/20/19 1315  piperacillin-tazobactam (ZOSYN) IVPB 3.375 g     3.375 g 100 mL/hr over 30 Minutes Intravenous  Once 03/20/19 1305 03/20/19 1521        Assessment/Plan HTN HLD CAD, s/p stenting  Abdominal pain, diverticulitis vs pan colitis - Appears to have resolved ileus, will start liquid diet - Continue IV abx - Ambulate  -recheck wbc in AM  FEN -Fulls VTE -Scds, Lovenox  ID -zosyn 10/5 --> Invanz 10/9   LOS: 5 days    Rosario Adie, MD  [facility]

## 2019-03-26 LAB — CBC
HCT: 36.9 % — ABNORMAL LOW (ref 39.0–52.0)
Hemoglobin: 11.9 g/dL — ABNORMAL LOW (ref 13.0–17.0)
MCH: 29.8 pg (ref 26.0–34.0)
MCHC: 32.2 g/dL (ref 30.0–36.0)
MCV: 92.3 fL (ref 80.0–100.0)
Platelets: 318 10*3/uL (ref 150–400)
RBC: 4 MIL/uL — ABNORMAL LOW (ref 4.22–5.81)
RDW: 12.9 % (ref 11.5–15.5)
WBC: 10.7 10*3/uL — ABNORMAL HIGH (ref 4.0–10.5)
nRBC: 0 % (ref 0.0–0.2)

## 2019-03-26 MED ORDER — PANTOPRAZOLE SODIUM 40 MG PO TBEC
40.0000 mg | DELAYED_RELEASE_TABLET | Freq: Every day | ORAL | Status: DC
  Administered 2019-03-26 – 2019-04-06 (×11): 40 mg via ORAL
  Filled 2019-03-26 (×11): qty 1

## 2019-03-26 MED ORDER — HYDROMORPHONE HCL 1 MG/ML IJ SOLN
0.5000 mg | INTRAMUSCULAR | Status: DC | PRN
  Administered 2019-03-26 – 2019-03-30 (×23): 1 mg via INTRAVENOUS
  Filled 2019-03-26 (×26): qty 1

## 2019-03-26 NOTE — Progress Notes (Signed)
Hospitalist progress note  Andrew Solomon  [id number] DOB: 01/22/1951 DOA: 03/20/2019 PCP: Harlan Stains, MD  Narrative:  68 year old male HTN, HLD, emphysema + prior smoker quit 2017, PCI + stent EF 2000-developed severe abdominal pain after eating some apple, watery diarrhea, nausea-emergency room work-up showed perforated diverticulitis-General surgery consulted-started on IV antibiotics  Assessment & Plan:   Diverticulitis + perforation-Ct 10/9 showing 3.6 cm abcess IV saline 40/H-Zosyn changed 10/10 to Invanz-we will continue Invanz for 1 more day and decide based on further work-up and possible imaging in the a.m. whether to transition Cutting back Dilaudid 0.5-1 q2-->every 4 Appreciate general surgery input--has been advanced to soft diet per them 10/11  Defer imaging to them-White count 10.7 now down from 14 AKI on admission,  Resolving BUN/creatinine now down from admission to 19/1.0 CAD status post stent 98 Continue aspirin 81 mg HLD lipid panel 08/05/2018 HDL 36 LDL 60.  Cont Crestor as OP Emphysema by h/o supplemental oxygen if prn--desat screen-continue albuterol nebs 2.5 q4 Atherosclerosis, HTN Continue atenolol 25 daily  DVT prophylaxis: Loveneox  Code Status:   Full   Family Communication: none +  inpatient Disposition Plan:  none   Consultants:   Gen surgery  Procedures:   none  Antimicrobials:   IV zosyn since 10/5   Subjective:  Pain somewhat improved ambulatory moving around to some degree Still getting pain meds every 2 understands we need to de-escalate Passing stool - Fever - chills Objective: Vitals:   03/25/19 1348 03/25/19 2019 03/25/19 2108 03/26/19 0600  BP: 130/68  138/72 138/66  Pulse: 71  69 68  Resp: 18  20 17   Temp: 99.5 F (37.5 C)  97.9 F (36.6 C) 98.4 F (36.9 C)  TempSrc: Oral  Oral Oral  SpO2: 96% 93% 99% 98%  Weight:      Height:        Intake/Output Summary (Last 24 hours) at 03/26/2019 0820 Last data filed at  03/26/2019 0600 Gross per 24 hour  Intake 1439.64 ml  Output 350 ml  Net 1089.64 ml   Filed Weights   03/20/19 1700  Weight: 79.1 kg    Examination:  Awake coherent Neuro intact s1 s2 no m Abdomen is much softer than previously he is comfortable and is ambulatory to some degree  Data Reviewed: I have personally reviewed following labs and imaging studies  CBC: Recent Labs  Lab 03/20/19 1006  03/21/19 1213 03/22/19 0756 03/23/19 0306 03/24/19 0253 03/25/19 0419 03/26/19 0410  WBC 7.6   < > 14.9* 18.1* 16.1* 14.0* 14.3* 10.7*  NEUTROABS 5.9  --  12.2*  --  14.7* 11.3* 11.1*  --   HGB 13.4   < > 12.4* 13.3 12.5* 11.6* 11.1* 11.9*  HCT 41.0   < > 38.5* 40.7 38.4* 35.5* 34.7* 36.9*  MCV 91.9   < > 93.4 94.0 92.5 92.9 93.3 92.3  PLT 258   < > 221 248 254 275 297 318   < > = values in this interval not displayed.   Basic Metabolic Panel: Recent Labs  Lab 03/21/19 0326 03/21/19 1212 03/23/19 0306 03/24/19 0253 03/25/19 0419  NA 138 135 137 137 136  K 4.2 4.2 3.8 3.6 3.4*  CL 107 106 105 104 100  CO2 21* 21* 20* 23 24  GLUCOSE 95 88 87 82 82  BUN 17 22 25* 23 19  CREATININE 1.26* 1.27* 1.11 1.03 1.04  CALCIUM 8.0* 7.7* 8.2* 8.2* 8.1*  PHOS  --  3.1  --   --  2.0*   GFR: Estimated Creatinine Clearance: 61.8 mL/min (by C-G formula based on SCr of 1.04 mg/dL). Liver Function Tests: Recent Labs  Lab 03/20/19 1006 03/21/19 0326 03/21/19 1212 03/25/19 0419  AST 41 26  --   --   ALT 53* 37  --   --   ALKPHOS 56 45  --   --   BILITOT 0.5 1.1  --   --   PROT 7.4 6.2*  --   --   ALBUMIN 3.8 3.1* 3.0* 2.5*   Recent Labs  Lab 03/20/19 1006  LIPASE 29   No results for input(s): AMMONIA in the last 168 hours. Coagulation Profile: No results for input(s): INR, PROTIME in the last 168 hours. Cardiac Enzymes:  Radiology Studies: Reviewed images personally in health database   Scheduled Meds: . aspirin EC  81 mg Oral Daily  . atenolol  25 mg Oral Daily  .  enoxaparin (LOVENOX) injection  40 mg Subcutaneous Q24H  . pantoprazole (PROTONIX) IV  40 mg Intravenous Q24H  . sodium chloride flush  3 mL Intravenous Q12H  . traZODone  50 mg Oral QHS   Continuous Infusions: . sodium chloride 40 mL/hr at 03/24/19 1802  . ertapenem Stopped (03/25/19 1712)    LOS: 6 days   Time spent: [place], MD [facility]  If 7PM-7AM, please contact night-coverage-look on AMION to find my number otherwise-prefer pages-not epic chat,please 03/26/2019, 8:20 AM

## 2019-03-26 NOTE — Progress Notes (Signed)
       Subjective: CC: Patient reports much less pain.  He is having BM's.  Tolerating liquid diet  Objective: Vital signs in last 24 hours: Temp:  [97.9 F (36.6 C)-99.5 F (37.5 C)] 98.4 F (36.9 C) (10/11 0600) Pulse Rate:  [68-71] 68 (10/11 0600) Resp:  [17-20] 17 (10/11 0600) BP: (130-138)/(66-72) 138/66 (10/11 0600) SpO2:  [93 %-99 %] 98 % (10/11 0600) Last BM Date: 03/25/19  Intake/Output from previous day: 10/10 0701 - 10/11 0700 In: 1439.6 [P.O.:400; I.V.:939.6; IV Piggyback:100] Out: 350 [Urine:350] Intake/Output this shift: No intake/output data recorded.  PE: Gen:  Alert, NAD, pleasant Abd: Soft, moderate distension with min tenderness  Lab Results:  Recent Labs    03/25/19 0419 03/26/19 0410  WBC 14.3* 10.7*  HGB 11.1* 11.9*  HCT 34.7* 36.9*  PLT 297 318   BMET Recent Labs    03/24/19 0253 03/25/19 0419  NA 137 136  K 3.6 3.4*  CL 104 100  CO2 23 24  GLUCOSE 82 82  BUN 23 19  CREATININE 1.03 1.04  CALCIUM 8.2* 8.1*   PT/INR No results for input(s): LABPROT, INR in the last 72 hours. CMP     Component Value Date/Time   NA 136 03/25/2019 0419   K 3.4 (L) 03/25/2019 0419   CL 100 03/25/2019 0419   CO2 24 03/25/2019 0419   GLUCOSE 82 03/25/2019 0419   BUN 19 03/25/2019 0419   CREATININE 1.04 03/25/2019 0419   CALCIUM 8.1 (L) 03/25/2019 0419   PROT 6.2 (L) 03/21/2019 0326   PROT 6.9 08/05/2018 1431   ALBUMIN 2.5 (L) 03/25/2019 0419   ALBUMIN 4.5 08/05/2018 1431   AST 26 03/21/2019 0326   ALT 37 03/21/2019 0326   ALKPHOS 45 03/21/2019 0326   BILITOT 1.1 03/21/2019 0326   BILITOT 0.4 08/05/2018 1431   GFRNONAA >60 03/25/2019 0419   GFRAA >60 03/25/2019 0419   Lipase     Component Value Date/Time   LIPASE 29 03/20/2019 1006       Studies/Results: No results found.  Anti-infectives: Anti-infectives (From admission, onward)   Start     Dose/Rate Route Frequency Ordered Stop   03/24/19 0800  ertapenem (INVANZ) 1,000 mg in  sodium chloride 0.9 % 100 mL IVPB     1 g 200 mL/hr over 30 Minutes Intravenous Every 24 hours 03/24/19 0738     03/20/19 2000  piperacillin-tazobactam (ZOSYN) IVPB 3.375 g  Status:  Discontinued     3.375 g 12.5 mL/hr over 240 Minutes Intravenous Every 8 hours 03/20/19 1701 03/24/19 0739   03/20/19 1500  piperacillin-tazobactam (ZOSYN) IVPB 3.375 g  Status:  Discontinued     3.375 g 100 mL/hr over 30 Minutes Intravenous Every 8 hours 03/20/19 1453 03/20/19 1701   03/20/19 1315  piperacillin-tazobactam (ZOSYN) IVPB 3.375 g     3.375 g 100 mL/hr over 30 Minutes Intravenous  Once 03/20/19 1305 03/20/19 1521       Assessment/Plan HTN HLD CAD, s/p stenting  Abdominal pain, diverticulitis vs pan colitis - Advance diet - ok to switch to PO abx - Ambulate  Pt will need f/u with Dr Collene Mares for possible colonoscopy.  Can f/u with [place] after that.  FEN -advance to soft diet VTE -Scds, Lovenox  ID -zosyn 10/5 --> Invanz 10/9   LOS: 6 days    Rosario Adie, MD  [facility]

## 2019-03-27 LAB — CBC WITH DIFFERENTIAL/PLATELET
Abs Immature Granulocytes: 0.31 10*3/uL — ABNORMAL HIGH (ref 0.00–0.07)
Basophils Absolute: 0.1 10*3/uL (ref 0.0–0.1)
Basophils Relative: 1 %
Eosinophils Absolute: 0.2 10*3/uL (ref 0.0–0.5)
Eosinophils Relative: 2 %
HCT: 35.5 % — ABNORMAL LOW (ref 39.0–52.0)
Hemoglobin: 11.3 g/dL — ABNORMAL LOW (ref 13.0–17.0)
Immature Granulocytes: 3 %
Lymphocytes Relative: 12 %
Lymphs Abs: 1.5 10*3/uL (ref 0.7–4.0)
MCH: 30.1 pg (ref 26.0–34.0)
MCHC: 31.8 g/dL (ref 30.0–36.0)
MCV: 94.7 fL (ref 80.0–100.0)
Monocytes Absolute: 1.3 10*3/uL — ABNORMAL HIGH (ref 0.1–1.0)
Monocytes Relative: 11 %
Neutro Abs: 8.4 10*3/uL — ABNORMAL HIGH (ref 1.7–7.7)
Neutrophils Relative %: 71 %
Platelets: 334 10*3/uL (ref 150–400)
RBC: 3.75 MIL/uL — ABNORMAL LOW (ref 4.22–5.81)
RDW: 12.9 % (ref 11.5–15.5)
WBC: 11.9 10*3/uL — ABNORMAL HIGH (ref 4.0–10.5)
nRBC: 0 % (ref 0.0–0.2)

## 2019-03-27 LAB — C DIFFICILE QUICK SCREEN W PCR REFLEX
C Diff antigen: NEGATIVE
C Diff interpretation: NOT DETECTED
C Diff toxin: NEGATIVE

## 2019-03-27 MED ORDER — TRAMADOL HCL 50 MG PO TABS
50.0000 mg | ORAL_TABLET | Freq: Four times a day (QID) | ORAL | Status: DC | PRN
  Administered 2019-03-27 (×2): 50 mg via ORAL
  Filled 2019-03-27: qty 2
  Filled 2019-03-27 (×2): qty 1

## 2019-03-27 NOTE — Progress Notes (Signed)
Subjective: CC: He reports that he has more "soreness" then actual pain.  His soreness is located in his lower abdomen and worse after sitting up and walking around.  He reports he ate a small amount of [place] yesterday for dinner without any nausea or vomiting.  He did require Dilaudid x5 yesterday.  He is ambulating in the halls.  He reports he continues to pass flatus and have loose, watery BMs that he reports are almost black?  Objective: Vital signs in last 24 hours: Temp:  [97.9 F (36.6 C)-99.7 F (37.6 C)] 98.7 F (37.1 C) (10/12 0635) Pulse Rate:  [68-72] 72 (10/12 0635) Resp:  [16] 16 (10/12 0635) BP: (119-136)/(63-75) 128/67 (10/12 0635) SpO2:  [94 %-100 %] 94 % (10/12 0635) Last BM Date: 03/25/19  Intake/Output from previous day: 10/11 0701 - 10/12 0700 In: 1997.5 [P.O.:960; I.V.:937.4; IV Piggyback:100.1] Out: 200 [Urine:200] Intake/Output this shift: No intake/output data recorded.  PE: Gen:  Alert, NAD, pleasant Lungs: Normal rate and effort Abd: Soft, NT/ND, +BS Ext:  No LE edema Skin: no rashes noted, warm and dry  Lab Results:  Recent Labs    03/26/19 0410 03/27/19 0342  WBC 10.7* 11.9*  HGB 11.9* 11.3*  HCT 36.9* 35.5*  PLT 318 334   BMET Recent Labs    03/25/19 0419  NA 136  K 3.4*  CL 100  CO2 24  GLUCOSE 82  BUN 19  CREATININE 1.04  CALCIUM 8.1*   PT/INR No results for input(s): LABPROT, INR in the last 72 hours. CMP     Component Value Date/Time   NA 136 03/25/2019 0419   K 3.4 (L) 03/25/2019 0419   CL 100 03/25/2019 0419   CO2 24 03/25/2019 0419   GLUCOSE 82 03/25/2019 0419   BUN 19 03/25/2019 0419   CREATININE 1.04 03/25/2019 0419   CALCIUM 8.1 (L) 03/25/2019 0419   PROT 6.2 (L) 03/21/2019 0326   PROT 6.9 08/05/2018 1431   ALBUMIN 2.5 (L) 03/25/2019 0419   ALBUMIN 4.5 08/05/2018 1431   AST 26 03/21/2019 0326   ALT 37 03/21/2019 0326   ALKPHOS 45 03/21/2019 0326   BILITOT 1.1 03/21/2019 0326   BILITOT 0.4  08/05/2018 1431   GFRNONAA >60 03/25/2019 0419   GFRAA >60 03/25/2019 0419   Lipase     Component Value Date/Time   LIPASE 29 03/20/2019 1006       Studies/Results: No results found.  Anti-infectives: Anti-infectives (From admission, onward)   Start     Dose/Rate Route Frequency Ordered Stop   03/24/19 0800  ertapenem (INVANZ) 1,000 mg in sodium chloride 0.9 % 100 mL IVPB     1 g 200 mL/hr over 30 Minutes Intravenous Every 24 hours 03/24/19 0738     03/20/19 2000  piperacillin-tazobactam (ZOSYN) IVPB 3.375 g  Status:  Discontinued     3.375 g 12.5 mL/hr over 240 Minutes Intravenous Every 8 hours 03/20/19 1701 03/24/19 0739   03/20/19 1500  piperacillin-tazobactam (ZOSYN) IVPB 3.375 g  Status:  Discontinued     3.375 g 100 mL/hr over 30 Minutes Intravenous Every 8 hours 03/20/19 1453 03/20/19 1701   03/20/19 1315  piperacillin-tazobactam (ZOSYN) IVPB 3.375 g     3.375 g 100 mL/hr over 30 Minutes Intravenous  Once 03/20/19 1305 03/20/19 1521       Assessment/Plan HTN HLD CAD, s/p stenting  Abdominal pain, diverticulitis vs pan colitis - Small bump in WBC today (14.3>10.7>11.9) - Last CT  10/8  - Continue IV abx today and monitor WBC tomorrow morning- Will discuss plan with MD - Ambulate showed RLQ multilocular fluid collections measuring 3.5 cm at their largest - If patient resolves without conservatively and does not require surgery, pt will need f/u with Dr Collene Mares for possible colonoscopy.  Can f/u with [place] after that.  FEN - Soft diet VTE -Scds, Lovenox  ID -zosyn 10/5 --> Invanz 10/9   LOS: 7 days    Jillyn Ledger , [facility] 03/27/2019, 8:22 AM Pager: [phone]

## 2019-03-27 NOTE — Care Management Important Message (Signed)
Important Message  Patient Details IM Letter given to Velva Harman RN to present to the Patient Name: Andrew Solomon MRN: [id number] Date of Birth: [birth year]   Medicare Important Message Given:  Yes     Kerin Salen 03/27/2019, 10:51 AM

## 2019-03-27 NOTE — Progress Notes (Signed)
Hospitalist progress note  Andrew Solomon  [id number] DOB: 28-Jun-1950 DOA: 03/20/2019 PCP: Harlan Stains, MD  Narrative:  68 year old male HTN, HLD, emphysema + prior smoker quit 2017, PCI + stent EF 2000-developed severe abdominal pain after eating some apple, watery diarrhea, nausea-emergency room work-up showed perforated diverticulitis-General surgery consulted-started on IV antibiotics  Assessment & Plan:   Diverticulitis + perforation-Ct 10/9 showing 3.6 cm abcess IV saline 40/H-Zosyn changed 10/10 to Invanz-defer to general surgery plan Continue Dilaudid White count up from 10-11 ?  C. difficile Multiple watery green stools visualized by me-check C. difficile panel stat-placed on precautions until then AKI on admission,  Resolving BUN/creatinine now down from admission to 19/1.0 recheck labs a.m. CAD status post stent 98 Continue aspirin 81 mg HLD lipid panel 08/05/2018 HDL 36 LDL 60. Crestor as OP Emphysema by h/o supplemental oxygen if prn--desat screen-continue albuterol nebs 2.5 q4 Atherosclerosis, HTN Continue atenolol 25 daily  DVT prophylaxis: Loveneox  Code Status:   Full   Family Communication: none +  inpatient Disposition Plan:  none   Consultants:   Gen surgery  Procedures:   none  Antimicrobials:   IV zosyn since 10/5   Subjective:  Multiple loose stools green appearance watery and seems to pass them every time he has to get up No laxatives used recently no nausea no vomiting abdominal pain is much better overall He does not have a fever  Objective: Vitals:   03/26/19 2030 03/27/19 0635 03/27/19 0830 03/27/19 1427  BP: 119/63 128/67 130/66 137/64  Pulse: 70 72 78 70  Resp: 16 16 18 17   Temp: 99.7 F (37.6 C) 98.7 F (37.1 C)  98.5 F (36.9 C)  TempSrc: Oral Oral  Oral  SpO2: 99% 94% 95% 98%  Weight:      Height:        Intake/Output Summary (Last 24 hours) at 03/27/2019 1447 Last data filed at 03/27/2019 1427 Gross per 24 hour   Intake 1728.36 ml  Output 200 ml  Net 1528.36 ml   Filed Weights   03/20/19 1700  Weight: 79.1 kg    Examination:  Awake coherent EOMI NCAT no distress mucosa moist Neuro intact s1 s2 no m Abdomen is distended but soft-exam obsficated secondary to him being seated No lower extremity edema  Data Reviewed: I have personally reviewed following labs and imaging studies  CBC: Recent Labs  Lab 03/21/19 1213  03/23/19 0306 03/24/19 0253 03/25/19 0419 03/26/19 0410 03/27/19 0342  WBC 14.9*   < > 16.1* 14.0* 14.3* 10.7* 11.9*  NEUTROABS 12.2*  --  14.7* 11.3* 11.1*  --  8.4*  HGB 12.4*   < > 12.5* 11.6* 11.1* 11.9* 11.3*  HCT 38.5*   < > 38.4* 35.5* 34.7* 36.9* 35.5*  MCV 93.4   < > 92.5 92.9 93.3 92.3 94.7  PLT 221   < > 254 275 297 318 334   < > = values in this interval not displayed.   Basic Metabolic Panel: Recent Labs  Lab 03/21/19 0326 03/21/19 1212 03/23/19 0306 03/24/19 0253 03/25/19 0419  NA 138 135 137 137 136  K 4.2 4.2 3.8 3.6 3.4*  CL 107 106 105 104 100  CO2 21* 21* 20* 23 24  GLUCOSE 95 88 87 82 82  BUN 17 22 25* 23 19  CREATININE 1.26* 1.27* 1.11 1.03 1.04  CALCIUM 8.0* 7.7* 8.2* 8.2* 8.1*  PHOS  --  3.1  --   --  2.0*   GFR: Estimated  Creatinine Clearance: 61.8 mL/min (by C-G formula based on SCr of 1.04 mg/dL). Liver Function Tests: Recent Labs  Lab 03/21/19 0326 03/21/19 1212 03/25/19 0419  AST 26  --   --   ALT 37  --   --   ALKPHOS 45  --   --   BILITOT 1.1  --   --   PROT 6.2*  --   --   ALBUMIN 3.1* 3.0* 2.5*   No results for input(s): LIPASE, AMYLASE in the last 168 hours. No results for input(s): AMMONIA in the last 168 hours. Coagulation Profile: No results for input(s): INR, PROTIME in the last 168 hours. Cardiac Enzymes:  Radiology Studies: Reviewed images personally in health database   Scheduled Meds: . aspirin EC  81 mg Oral Daily  . atenolol  25 mg Oral Daily  . enoxaparin (LOVENOX) injection  40 mg  Subcutaneous Q24H  . pantoprazole  40 mg Oral Daily  . sodium chloride flush  3 mL Intravenous Q12H  . traZODone  50 mg Oral QHS   Continuous Infusions: . sodium chloride 40 mL/hr at 03/24/19 1802  . ertapenem 1,000 mg (03/27/19 0801)    LOS: 7 days   Time spent: [place], MD [facility]  If 7PM-7AM, please contact night-coverage-look on AMION to find my number otherwise-prefer pages-not epic chat,please 03/27/2019, 2:47 PM

## 2019-03-28 ENCOUNTER — Encounter (HOSPITAL_COMMUNITY): Payer: Self-pay | Admitting: Certified Registered Nurse Anesthetist

## 2019-03-28 ENCOUNTER — Inpatient Hospital Stay (HOSPITAL_COMMUNITY): Payer: Medicare Other

## 2019-03-28 LAB — CBC WITH DIFFERENTIAL/PLATELET
Abs Immature Granulocytes: 0.33 10*3/uL — ABNORMAL HIGH (ref 0.00–0.07)
Basophils Absolute: 0.1 10*3/uL (ref 0.0–0.1)
Basophils Relative: 0 %
Eosinophils Absolute: 0.2 10*3/uL (ref 0.0–0.5)
Eosinophils Relative: 1 %
HCT: 34.8 % — ABNORMAL LOW (ref 39.0–52.0)
Hemoglobin: 11.3 g/dL — ABNORMAL LOW (ref 13.0–17.0)
Immature Granulocytes: 2 %
Lymphocytes Relative: 7 %
Lymphs Abs: 1.2 10*3/uL (ref 0.7–4.0)
MCH: 30.1 pg (ref 26.0–34.0)
MCHC: 32.5 g/dL (ref 30.0–36.0)
MCV: 92.8 fL (ref 80.0–100.0)
Monocytes Absolute: 1.3 10*3/uL — ABNORMAL HIGH (ref 0.1–1.0)
Monocytes Relative: 8 %
Neutro Abs: 13.9 10*3/uL — ABNORMAL HIGH (ref 1.7–7.7)
Neutrophils Relative %: 82 %
Platelets: 342 10*3/uL (ref 150–400)
RBC: 3.75 MIL/uL — ABNORMAL LOW (ref 4.22–5.81)
RDW: 13 % (ref 11.5–15.5)
WBC: 17 10*3/uL — ABNORMAL HIGH (ref 4.0–10.5)
nRBC: 0 % (ref 0.0–0.2)

## 2019-03-28 LAB — BASIC METABOLIC PANEL
Anion gap: 10 (ref 5–15)
BUN: 11 mg/dL (ref 8–23)
CO2: 24 mmol/L (ref 22–32)
Calcium: 8 mg/dL — ABNORMAL LOW (ref 8.9–10.3)
Chloride: 104 mmol/L (ref 98–111)
Creatinine, Ser: 0.9 mg/dL (ref 0.61–1.24)
GFR calc Af Amer: >60 mL/min (ref >60)
GFR calc non Af Amer: >60 mL/min (ref >60)
Glucose, Bld: 97 mg/dL (ref 70–99)
Potassium: 3.3 mmol/L — ABNORMAL LOW (ref 3.5–5.1)
Sodium: 138 mmol/L (ref 135–145)

## 2019-03-28 LAB — SURGICAL PCR SCREEN
MRSA, PCR: NEGATIVE
Staphylococcus aureus: NEGATIVE

## 2019-03-28 MED ORDER — SODIUM CHLORIDE (PF) 0.9 % IJ SOLN
INTRAMUSCULAR | Status: AC
  Filled 2019-03-28: qty 50

## 2019-03-28 MED ORDER — IOHEXOL 300 MG/ML  SOLN: 30.0000 mL | Freq: Once | INTRAMUSCULAR | Status: DC | PRN

## 2019-03-28 MED ORDER — IOHEXOL 300 MG/ML  SOLN
100.0000 mL | Freq: Once | INTRAMUSCULAR | Status: AC | PRN
  Administered 2019-03-28: 13:00:00 100 mL via INTRAVENOUS

## 2019-03-28 NOTE — H&P (View-Only) (Signed)
Subjective: CC: Diarrhea  Patient reports that his pain continues to improve.  He reports that he does have some lower abdominal cramping before episodes of diarrhea.  Otherwise no abdominal pain.  He had 3 episodes of loose, watery stools yesterday.  He did have a C. difficile test that was negative.  He denies any subjective fever or chills.  T-max was 100.1.  He is tolerating his diet but only drinking liquids (broths from chicken noodles soup, ginger ale etc.).  He is ambulating in the halls.  Objective: Vital signs in last 24 hours: Temp:  [98.5 F (36.9 C)-100.1 F (37.8 C)] 99.5 F (37.5 C) (10/13 0627) Pulse Rate:  [70-78] 78 (10/13 0627) Resp:  [16-18] 18 (10/13 0627) BP: (121-137)/(64-72) 129/72 (10/13 0627) SpO2:  [94 %-98 %] 94 % (10/13 0627) Last BM Date: 03/27/19  Intake/Output from previous day: 10/12 0701 - 10/13 0700 In: 2526.9 [P.O.:1590; I.V.:936.9] Out: 0  Intake/Output this shift: No intake/output data recorded.  PE: Gen:  Alert, NAD, pleasant Lungs: Normal rate and effort Abd: Soft, NT/ND, +BS Ext:  No LE edema Skin: no rashes noted, warm and dry  Lab Results:  Recent Labs    03/27/19 0342 03/28/19 0359  WBC 11.9* 17.0*  HGB 11.3* 11.3*  HCT 35.5* 34.8*  PLT 334 342   BMET Recent Labs    03/28/19 0359  NA 138  K 3.3*  CL 104  CO2 24  GLUCOSE 97  BUN 11  CREATININE 0.90  CALCIUM 8.0*   PT/INR No results for input(s): LABPROT, INR in the last 72 hours. CMP     Component Value Date/Time   NA 138 03/28/2019 0359   K 3.3 (L) 03/28/2019 0359   CL 104 03/28/2019 0359   CO2 24 03/28/2019 0359   GLUCOSE 97 03/28/2019 0359   BUN 11 03/28/2019 0359   CREATININE 0.90 03/28/2019 0359   CALCIUM 8.0 (L) 03/28/2019 0359   PROT 6.2 (L) 03/21/2019 0326   PROT 6.9 08/05/2018 1431   ALBUMIN 2.5 (L) 03/25/2019 0419   ALBUMIN 4.5 08/05/2018 1431   AST 26 03/21/2019 0326   ALT 37 03/21/2019 0326   ALKPHOS 45 03/21/2019 0326   BILITOT  1.1 03/21/2019 0326   BILITOT 0.4 08/05/2018 1431   GFRNONAA >60 03/28/2019 0359   GFRAA >60 03/28/2019 0359   Lipase     Component Value Date/Time   LIPASE 29 03/20/2019 1006       Studies/Results: No results found.  Anti-infectives: Anti-infectives (From admission, onward)   Start     Dose/Rate Route Frequency Ordered Stop   03/24/19 0800  ertapenem (INVANZ) 1,000 mg in sodium chloride 0.9 % 100 mL IVPB     1 g 200 mL/hr over 30 Minutes Intravenous Every 24 hours 03/24/19 0738     03/20/19 2000  piperacillin-tazobactam (ZOSYN) IVPB 3.375 g  Status:  Discontinued     3.375 g 12.5 mL/hr over 240 Minutes Intravenous Every 8 hours 03/20/19 1701 03/24/19 0739   03/20/19 1500  piperacillin-tazobactam (ZOSYN) IVPB 3.375 g  Status:  Discontinued     3.375 g 100 mL/hr over 30 Minutes Intravenous Every 8 hours 03/20/19 1453 03/20/19 1701   03/20/19 1315  piperacillin-tazobactam (ZOSYN) IVPB 3.375 g     3.375 g 100 mL/hr over 30 Minutes Intravenous  Once 03/20/19 1305 03/20/19 1521       Assessment/Plan HTN HLD CAD, s/p stenting Diarrhea - C. Diff negative  Abdominal pain, diverticulitis  vs pan colitis - WBC up today (14.3>10.7>11.9>17) - Last CT 10/8 showed RLQ multilocular fluid collections measuring 3.5 cm at their largest - Repeat CT today with IV and Oral Contrast - Continue IV abx  - Ambulate - Hopefully patient will get through this flare without requiring surgery. If patient resolves conservatively and does not require surgery, pt will need f/u with Dr Collene Mares for possible colonoscopy. He can f/u with [place] after that.  FEN - Soft diet VTE -Scds, Lovenox  ID -zosyn 10/5 - 10/9. Invanz 10/9 >>  Plan: Continue IV abx. CT scan.    LOS: 8 days    Jillyn Ledger , [facility] 03/28/2019, 9:58 AM Pager: [phone]

## 2019-03-28 NOTE — Progress Notes (Signed)
Subjective: CC: Diarrhea  Patient reports that his pain continues to improve.  He reports that he does have some lower abdominal cramping before episodes of diarrhea.  Otherwise no abdominal pain.  He had 3 episodes of loose, watery stools yesterday.  He did have a C. difficile test that was negative.  He denies any subjective fever or chills.  T-max was 100.1.  He is tolerating his diet but only drinking liquids (broths from chicken noodles soup, ginger ale etc.).  He is ambulating in the halls.  Objective: Vital signs in last 24 hours: Temp:  [98.5 F (36.9 C)-100.1 F (37.8 C)] 99.5 F (37.5 C) (10/13 0627) Pulse Rate:  [70-78] 78 (10/13 0627) Resp:  [16-18] 18 (10/13 0627) BP: (121-137)/(64-72) 129/72 (10/13 0627) SpO2:  [94 %-98 %] 94 % (10/13 0627) Last BM Date: 03/27/19  Intake/Output from previous day: 10/12 0701 - 10/13 0700 In: 2526.9 [P.O.:1590; I.V.:936.9] Out: 0  Intake/Output this shift: No intake/output data recorded.  PE: Gen:  Alert, NAD, pleasant Lungs: Normal rate and effort Abd: Soft, NT/ND, +BS Ext:  No LE edema Skin: no rashes noted, warm and dry  Lab Results:  Recent Labs    03/27/19 0342 03/28/19 0359  WBC 11.9* 17.0*  HGB 11.3* 11.3*  HCT 35.5* 34.8*  PLT 334 342   BMET Recent Labs    03/28/19 0359  NA 138  K 3.3*  CL 104  CO2 24  GLUCOSE 97  BUN 11  CREATININE 0.90  CALCIUM 8.0*   PT/INR No results for input(s): LABPROT, INR in the last 72 hours. CMP     Component Value Date/Time   NA 138 03/28/2019 0359   K 3.3 (L) 03/28/2019 0359   CL 104 03/28/2019 0359   CO2 24 03/28/2019 0359   GLUCOSE 97 03/28/2019 0359   BUN 11 03/28/2019 0359   CREATININE 0.90 03/28/2019 0359   CALCIUM 8.0 (L) 03/28/2019 0359   PROT 6.2 (L) 03/21/2019 0326   PROT 6.9 08/05/2018 1431   ALBUMIN 2.5 (L) 03/25/2019 0419   ALBUMIN 4.5 08/05/2018 1431   AST 26 03/21/2019 0326   ALT 37 03/21/2019 0326   ALKPHOS 45 03/21/2019 0326   BILITOT  1.1 03/21/2019 0326   BILITOT 0.4 08/05/2018 1431   GFRNONAA >60 03/28/2019 0359   GFRAA >60 03/28/2019 0359   Lipase     Component Value Date/Time   LIPASE 29 03/20/2019 1006       Studies/Results: No results found.  Anti-infectives: Anti-infectives (From admission, onward)   Start     Dose/Rate Route Frequency Ordered Stop   03/24/19 0800  ertapenem (INVANZ) 1,000 mg in sodium chloride 0.9 % 100 mL IVPB     1 g 200 mL/hr over 30 Minutes Intravenous Every 24 hours 03/24/19 0738     03/20/19 2000  piperacillin-tazobactam (ZOSYN) IVPB 3.375 g  Status:  Discontinued     3.375 g 12.5 mL/hr over 240 Minutes Intravenous Every 8 hours 03/20/19 1701 03/24/19 0739   03/20/19 1500  piperacillin-tazobactam (ZOSYN) IVPB 3.375 g  Status:  Discontinued     3.375 g 100 mL/hr over 30 Minutes Intravenous Every 8 hours 03/20/19 1453 03/20/19 1701   03/20/19 1315  piperacillin-tazobactam (ZOSYN) IVPB 3.375 g     3.375 g 100 mL/hr over 30 Minutes Intravenous  Once 03/20/19 1305 03/20/19 1521       Assessment/Plan HTN HLD CAD, s/p stenting Diarrhea - C. Diff negative  Abdominal pain, diverticulitis  vs pan colitis - WBC up today (14.3>10.7>11.9>17) - Last CT 10/8 showed RLQ multilocular fluid collections measuring 3.5 cm at their largest - Repeat CT today with IV and Oral Contrast - Continue IV abx  - Ambulate - Hopefully patient will get through this flare without requiring surgery. If patient resolves conservatively and does not require surgery, pt will need f/u with Dr Collene Mares for possible colonoscopy. He can f/u with [place] after that.  FEN - Soft diet VTE -Scds, Lovenox  ID -zosyn 10/5 - 10/9. Invanz 10/9 >>  Plan: Continue IV abx. CT scan.    LOS: 8 days    Jillyn Ledger , [facility] 03/28/2019, 9:58 AM Pager: [phone]

## 2019-03-28 NOTE — Progress Notes (Signed)
Hospitalist progress note  Andrew Solomon  [id number] DOB: 14-Jul-1950 DOA: 03/20/2019 PCP: Harlan Stains, MD  Narrative:  68 year old male HTN, HLD, emphysema + prior smoker quit 2017, PCI + stent EF 2000-developed severe abdominal pain after eating some apple, watery diarrhea, nausea-emergency room work-up showed perforated diverticulitis-General surgery consulted-started on IV antibiotics  Assessment & Plan:   Diverticulitis + perforation-Ct 10/9 showing 3.6 cm abcess IV saline 40/H-Zosyn changed 10/10 to Invanz-most recent CT scan showing linear abscess not resolved-surgeons to discuss further Continue Dilaudid White count up from 10-11-->17?  Needs further surgery ?  C. difficile Multiple watery green stools still present but probably secondary to underlying diverticulitis versus an abscess-continue diet as per surgery AKI on admission,  Resolving BUN/creatinine now down from admission to 11/0.9 CAD status post stent 98 Continue aspirin 81 mg HLD lipid panel 08/05/2018 HDL 36 LDL 60. Crestor as OP Emphysema by h/o continue albuterol nebs 2.5 q4 Atherosclerosis, HTN Continue atenolol 25 daily  DVT prophylaxis: Loveneox  Code Status:   Full   Family Communication: none +  inpatient Disposition Plan:  none   Consultants:   Gen surgery  Procedures:   none  Antimicrobials:   IV zosyn since 10/5   Subjective: Awake coherent no distress EOMI NCAT Mild abdominal pain multiple watery stools no fever no chills Passing mainly clear watery stools   Objective: Vitals:   03/27/19 2152 03/28/19 0627 03/28/19 1333 03/28/19 1335  BP: 121/66 129/72 (!) 109/45 113/69  Pulse: 78 78 76 76  Resp: 16 18 17    Temp: 100.1 F (37.8 C) 99.5 F (37.5 C) 98 F (36.7 C)   TempSrc: Oral Oral    SpO2: 95% 94%    Weight:      Height:        Intake/Output Summary (Last 24 hours) at 03/28/2019 1515 Last data filed at 03/28/2019 1341 Gross per 24 hour  Intake 2146.94 ml  Output 0  ml  Net 2146.94 ml   Filed Weights   03/20/19 1700  Weight: 79.1 kg    Examination:  Awake coherent EOMI NCAT no distress mucosa moist Neuro intact s1 s2 no m  distended but soft-exam obsficated secondary to him being seated No lower extremity edema  Data Reviewed: I have personally reviewed following labs and imaging studies  CBC: Recent Labs  Lab 03/23/19 0306 03/24/19 0253 03/25/19 0419 03/26/19 0410 03/27/19 0342 03/28/19 0359  WBC 16.1* 14.0* 14.3* 10.7* 11.9* 17.0*  NEUTROABS 14.7* 11.3* 11.1*  --  8.4* 13.9*  HGB 12.5* 11.6* 11.1* 11.9* 11.3* 11.3*  HCT 38.4* 35.5* 34.7* 36.9* 35.5* 34.8*  MCV 92.5 92.9 93.3 92.3 94.7 92.8  PLT 254 275 297 318 334 [id number]   Basic Metabolic Panel: Recent Labs  Lab 03/23/19 0306 03/24/19 0253 03/25/19 0419 03/28/19 0359  NA 137 137 136 138  K 3.8 3.6 3.4* 3.3*  CL 105 104 100 104  CO2 20* 23 24 24   GLUCOSE 87 82 82 97  BUN 25* 23 19 11   CREATININE 1.11 1.03 1.04 0.90  CALCIUM 8.2* 8.2* 8.1* 8.0*  PHOS  --   --  2.0*  --    GFR: Estimated Creatinine Clearance: 71.4 mL/min (by C-G formula based on SCr of 0.9 mg/dL). Liver Function Tests: Recent Labs  Lab 03/25/19 0419  ALBUMIN 2.5*   No results for input(s): LIPASE, AMYLASE in the last 168 hours. No results for input(s): AMMONIA in the last 168 hours. Coagulation Profile: No results for input(s): INR,  PROTIME in the last 168 hours. Cardiac Enzymes:  Radiology Studies: Reviewed images personally in health database   Scheduled Meds: . aspirin EC  81 mg Oral Daily  . atenolol  25 mg Oral Daily  . enoxaparin (LOVENOX) injection  40 mg Subcutaneous Q24H  . pantoprazole  40 mg Oral Daily  . sodium chloride (PF)      . sodium chloride flush  3 mL Intravenous Q12H  . traZODone  50 mg Oral QHS   Continuous Infusions: . sodium chloride 40 mL/hr at 03/27/19 2056  . ertapenem 1,000 mg (03/28/19 0829)    LOS: 8 days   Time spent: [place], MD [facility]  If 7PM-7AM, please contact night-coverage-look on AMION to find my number otherwise-prefer pages-not epic chat,please 03/28/2019, 3:15 PM

## 2019-03-29 ENCOUNTER — Encounter (HOSPITAL_COMMUNITY)
Admission: EM | Disposition: A | Discharge status: Home / Self Care | Payer: Self-pay | Source: Home / Self Care | Attending: Family Medicine

## 2019-03-29 ENCOUNTER — Inpatient Hospital Stay (HOSPITAL_COMMUNITY): Payer: Medicare Other | Admitting: Certified Registered Nurse Anesthetist

## 2019-03-29 DIAGNOSIS — K567 Ileus, unspecified: Secondary | ICD-10-CM | POA: Clinically undetermined

## 2019-03-29 DIAGNOSIS — D72829 Elevated white blood cell count, unspecified: Secondary | ICD-10-CM | POA: Clinically undetermined

## 2019-03-29 HISTORY — DX: Elevated white blood cell count, unspecified: D72.829

## 2019-03-29 HISTORY — PX: LAPAROSCOPIC ABDOMINAL EXPLORATION: SHX6249

## 2019-03-29 HISTORY — DX: Ileus, unspecified: K56.7

## 2019-03-29 LAB — CBC WITH DIFFERENTIAL/PLATELET
Abs Immature Granulocytes: 0.28 10*3/uL — ABNORMAL HIGH (ref 0.00–0.07)
Basophils Absolute: 0.1 10*3/uL (ref 0.0–0.1)
Basophils Relative: 1 %
Eosinophils Absolute: 0.2 10*3/uL (ref 0.0–0.5)
Eosinophils Relative: 2 %
HCT: 34.3 % — ABNORMAL LOW (ref 39.0–52.0)
Hemoglobin: 11 g/dL — ABNORMAL LOW (ref 13.0–17.0)
Immature Granulocytes: 2 %
Lymphocytes Relative: 9 %
Lymphs Abs: 1.4 10*3/uL (ref 0.7–4.0)
MCH: 30 pg (ref 26.0–34.0)
MCHC: 32.1 g/dL (ref 30.0–36.0)
MCV: 93.5 fL (ref 80.0–100.0)
Monocytes Absolute: 1.2 10*3/uL — ABNORMAL HIGH (ref 0.1–1.0)
Monocytes Relative: 8 %
Neutro Abs: 13 10*3/uL — ABNORMAL HIGH (ref 1.7–7.7)
Neutrophils Relative %: 78 %
Platelets: 336 10*3/uL (ref 150–400)
RBC: 3.67 MIL/uL — ABNORMAL LOW (ref 4.22–5.81)
RDW: 13.1 % (ref 11.5–15.5)
WBC: 16.2 10*3/uL — ABNORMAL HIGH (ref 4.0–10.5)
nRBC: 0 % (ref 0.0–0.2)

## 2019-03-29 LAB — COMPREHENSIVE METABOLIC PANEL
ALT: 30 U/L (ref 0–44)
AST: 27 U/L (ref 15–41)
Albumin: 2.4 g/dL — ABNORMAL LOW (ref 3.5–5.0)
Alkaline Phosphatase: 54 U/L (ref 38–126)
Anion gap: 13 (ref 5–15)
BUN: 8 mg/dL (ref 8–23)
CO2: 23 mmol/L (ref 22–32)
Calcium: 8 mg/dL — ABNORMAL LOW (ref 8.9–10.3)
Chloride: 101 mmol/L (ref 98–111)
Creatinine, Ser: 0.9 mg/dL (ref 0.61–1.24)
GFR calc Af Amer: >60 mL/min (ref >60)
GFR calc non Af Amer: >60 mL/min (ref >60)
Glucose, Bld: 87 mg/dL (ref 70–99)
Potassium: 3.1 mmol/L — ABNORMAL LOW (ref 3.5–5.1)
Sodium: 137 mmol/L (ref 135–145)
Total Bilirubin: 0.9 mg/dL (ref 0.3–1.2)
Total Protein: 5.3 g/dL — ABNORMAL LOW (ref 6.5–8.1)

## 2019-03-29 LAB — MAGNESIUM: Magnesium: 1.9 mg/dL (ref 1.7–2.4)

## 2019-03-29 SURGERY — EXPLORATION, ABDOMEN, LAPAROSCOPIC
Anesthesia: General

## 2019-03-29 MED ORDER — ACETAMINOPHEN 10 MG/ML IV SOLN
INTRAVENOUS | Status: AC
  Administered 2019-03-29: 1000 mg via INTRAVENOUS
  Filled 2019-03-29: qty 100

## 2019-03-29 MED ORDER — PROMETHAZINE HCL 25 MG/ML IJ SOLN: 6.2500 mg | INTRAMUSCULAR | Status: DC | PRN

## 2019-03-29 MED ORDER — BUPIVACAINE HCL (PF) 0.25 % IJ SOLN
INTRAMUSCULAR | Status: AC
  Filled 2019-03-29: qty 30

## 2019-03-29 MED ORDER — MEPERIDINE HCL 50 MG/ML IJ SOLN: 6.2500 mg | INTRAMUSCULAR | Status: DC | PRN

## 2019-03-29 MED ORDER — PROPOFOL 10 MG/ML IV BOLUS
INTRAVENOUS | Status: AC
  Filled 2019-03-29: qty 20

## 2019-03-29 MED ORDER — LIDOCAINE 2% (20 MG/ML) 5 ML SYRINGE
INTRAMUSCULAR | Status: AC
  Filled 2019-03-29: qty 5

## 2019-03-29 MED ORDER — MIDAZOLAM HCL 5 MG/5ML IJ SOLN
INTRAMUSCULAR | Status: DC | PRN
  Administered 2019-03-29: 2 mg via INTRAVENOUS

## 2019-03-29 MED ORDER — DEXAMETHASONE SODIUM PHOSPHATE 4 MG/ML IJ SOLN
INTRAMUSCULAR | Status: DC | PRN
  Administered 2019-03-29: 5 mg via INTRAVENOUS

## 2019-03-29 MED ORDER — LACTATED RINGERS IV SOLN
INTRAVENOUS | Status: DC | PRN
  Administered 2019-03-29: 12:00:00 via INTRAVENOUS

## 2019-03-29 MED ORDER — ROCURONIUM BROMIDE 10 MG/ML (PF) SYRINGE
PREFILLED_SYRINGE | INTRAVENOUS | Status: DC | PRN
  Administered 2019-03-29: 50 mg via INTRAVENOUS

## 2019-03-29 MED ORDER — ROCURONIUM BROMIDE 10 MG/ML (PF) SYRINGE
PREFILLED_SYRINGE | INTRAVENOUS | Status: AC
  Filled 2019-03-29: qty 10

## 2019-03-29 MED ORDER — BUPIVACAINE HCL (PF) 0.25 % IJ SOLN
INTRAMUSCULAR | Status: DC | PRN
  Administered 2019-03-29: 15 mL

## 2019-03-29 MED ORDER — POTASSIUM CHLORIDE 20 MEQ PO PACK
40.0000 meq | PACK | ORAL | Status: AC
  Administered 2019-03-29 (×2): 40 meq via ORAL
  Filled 2019-03-29 (×2): qty 2

## 2019-03-29 MED ORDER — ONDANSETRON HCL 4 MG/2ML IJ SOLN
INTRAMUSCULAR | Status: AC
  Filled 2019-03-29: qty 2

## 2019-03-29 MED ORDER — FENTANYL CITRATE (PF) 250 MCG/5ML IJ SOLN
INTRAMUSCULAR | Status: AC
  Filled 2019-03-29: qty 5

## 2019-03-29 MED ORDER — PROPOFOL 10 MG/ML IV BOLUS
INTRAVENOUS | Status: DC | PRN
  Administered 2019-03-29: 100 mg via INTRAVENOUS
  Administered 2019-03-29: 50 mg via INTRAVENOUS

## 2019-03-29 MED ORDER — LACTATED RINGERS IR SOLN
Status: DC | PRN
  Administered 2019-03-29: 1000 mL

## 2019-03-29 MED ORDER — ATENOLOL 25 MG PO TABS
12.5000 mg | ORAL_TABLET | Freq: Every day | ORAL | Status: DC
  Administered 2019-03-30 – 2019-04-06 (×8): 12.5 mg via ORAL
  Filled 2019-03-29 (×9): qty 1

## 2019-03-29 MED ORDER — PHENYLEPHRINE 40 MCG/ML (10ML) SYRINGE FOR IV PUSH (FOR BLOOD PRESSURE SUPPORT)
PREFILLED_SYRINGE | INTRAVENOUS | Status: AC
  Filled 2019-03-29: qty 10

## 2019-03-29 MED ORDER — LIDOCAINE 2% (20 MG/ML) 5 ML SYRINGE
INTRAMUSCULAR | Status: DC | PRN
  Administered 2019-03-29: 80 mg via INTRAVENOUS

## 2019-03-29 MED ORDER — PHENYLEPHRINE 40 MCG/ML (10ML) SYRINGE FOR IV PUSH (FOR BLOOD PRESSURE SUPPORT)
PREFILLED_SYRINGE | INTRAVENOUS | Status: DC | PRN
  Administered 2019-03-29: 80 ug via INTRAVENOUS
  Administered 2019-03-29 (×2): 120 ug via INTRAVENOUS

## 2019-03-29 MED ORDER — MIDAZOLAM HCL 2 MG/2ML IJ SOLN
INTRAMUSCULAR | Status: AC
  Filled 2019-03-29: qty 2

## 2019-03-29 MED ORDER — FENTANYL CITRATE (PF) 100 MCG/2ML IJ SOLN
INTRAMUSCULAR | Status: DC | PRN
  Administered 2019-03-29: 50 ug via INTRAVENOUS
  Administered 2019-03-29: 100 ug via INTRAVENOUS

## 2019-03-29 MED ORDER — ACETAMINOPHEN 10 MG/ML IV SOLN
1000.0000 mg | Freq: Once | INTRAVENOUS | Status: DC | PRN
  Administered 2019-03-29: 14:00:00 1000 mg via INTRAVENOUS

## 2019-03-29 MED ORDER — DEXAMETHASONE SODIUM PHOSPHATE 10 MG/ML IJ SOLN
INTRAMUSCULAR | Status: AC
  Filled 2019-03-29: qty 1

## 2019-03-29 MED ORDER — FENTANYL CITRATE (PF) 100 MCG/2ML IJ SOLN
25.0000 ug | INTRAMUSCULAR | Status: DC | PRN
  Administered 2019-03-29: 14:00:00 50 ug via INTRAVENOUS

## 2019-03-29 MED ORDER — FENTANYL CITRATE (PF) 100 MCG/2ML IJ SOLN
INTRAMUSCULAR | Status: AC
  Administered 2019-03-29: 50 ug via INTRAVENOUS
  Filled 2019-03-29: qty 2

## 2019-03-29 SURGICAL SUPPLY — 42 items
BENZOIN TINCTURE PRP APPL 2/3 (GAUZE/BANDAGES/DRESSINGS) IMPLANT
CLOSURE WOUND 1/2 X4 (GAUZE/BANDAGES/DRESSINGS)
COVER WAND RF STERILE (DRAPES) IMPLANT
DECANTER SPIKE VIAL GLASS SM (MISCELLANEOUS) ×2 IMPLANT
DERMABOND ADVANCED (GAUZE/BANDAGES/DRESSINGS)
DERMABOND ADVANCED .7 DNX12 (GAUZE/BANDAGES/DRESSINGS) IMPLANT
DRAIN CHANNEL 19F RND (DRAIN) ×2 IMPLANT
DRSG TEGADERM 2-3/8X2-3/4 SM (GAUZE/BANDAGES/DRESSINGS) ×4 IMPLANT
DRSG TEGADERM 4X4.75 (GAUZE/BANDAGES/DRESSINGS) ×2 IMPLANT
ELECT REM PT RETURN 15FT ADLT (MISCELLANEOUS) ×3 IMPLANT
EVACUATOR DRAINAGE 10X20 100CC (DRAIN) IMPLANT
EVACUATOR SILICONE 100CC (DRAIN) ×2
GAUZE SPONGE 2X2 8PLY STRL LF (GAUZE/BANDAGES/DRESSINGS) IMPLANT
GLOVE BIO SURGEON STRL SZ7.5 (GLOVE) ×4 IMPLANT
GLOVE BIOGEL PI IND STRL 7.0 (GLOVE) ×1 IMPLANT
GLOVE BIOGEL PI INDICATOR 7.0 (GLOVE) ×2
GOWN STRL REUS W/ TWL XL LVL3 (GOWN DISPOSABLE) ×1 IMPLANT
GOWN STRL REUS W/TWL LRG LVL3 (GOWN DISPOSABLE) ×1 IMPLANT
GOWN STRL REUS W/TWL XL LVL3 (GOWN DISPOSABLE) ×5 IMPLANT
IRRIG SUCT STRYKERFLOW 2 WTIP (MISCELLANEOUS) ×3
IRRIGATION SUCT STRKRFLW 2 WTP (MISCELLANEOUS) IMPLANT
KIT BASIN OR (CUSTOM PROCEDURE TRAY) ×3 IMPLANT
KIT TURNOVER KIT A (KITS) ×2 IMPLANT
SET TUBE SMOKE EVAC HIGH FLOW (TUBING) ×3 IMPLANT
SHEARS HARMONIC ACE PLUS 36CM (ENDOMECHANICALS) IMPLANT
SLEEVE XCEL OPT CAN 5 100 (ENDOMECHANICALS) ×4 IMPLANT
SOL ANTI FOG 6CC (MISCELLANEOUS) ×1 IMPLANT
SOLUTION ANTI FOG 6CC (MISCELLANEOUS)
SPONGE DRAIN TRACH 4X4 STRL 2S (GAUZE/BANDAGES/DRESSINGS) ×2 IMPLANT
SPONGE GAUZE 2X2 STER 10/PKG (GAUZE/BANDAGES/DRESSINGS) ×2
STAPLER VISISTAT 35W (STAPLE) ×2 IMPLANT
STRIP CLOSURE SKIN 1/2X4 (GAUZE/BANDAGES/DRESSINGS) IMPLANT
SUT ETHILON 2 0 PS N (SUTURE) ×2 IMPLANT
SUT VIC AB 4-0 PS2 27 (SUTURE) IMPLANT
TOWEL OR 17X26 10 PK STRL BLUE (TOWEL DISPOSABLE) ×3 IMPLANT
TRAY FOLEY MTR SLVR 16FR STAT (SET/KITS/TRAYS/PACK) IMPLANT
TRAY LAPAROSCOPIC (CUSTOM PROCEDURE TRAY) ×3 IMPLANT
TROCAR BLADELESS OPT 5 100 (ENDOMECHANICALS) ×2 IMPLANT
TROCAR XCEL BLUNT TIP 100MML (ENDOMECHANICALS) ×1 IMPLANT
TROCAR XCEL NON-BLD 11X100MML (ENDOMECHANICALS) IMPLANT
TROCAR XCEL UNIV SLVE 11M 100M (ENDOMECHANICALS) IMPLANT
WATER STERILE IRR 1000ML POUR (IV SOLUTION) ×1 IMPLANT

## 2019-03-29 NOTE — Progress Notes (Signed)
[facility]  PROGRESS NOTE  Andrew Solomon [id number] DOB: 07/28/1950 DOA: 03/20/2019 PCP: Harlan Stains, MD  Brief History    Andrew Solomon is a 68 y.o. year old male with medical history significant for HTN, HLD, emphysema, CAD who presented on 03/20/2019 with worsening abdominal pain after eating, diarrhea, nausea and was found to have sigmoid diverticulitis with small perforation.  Repeat CT abdomen imaging showed perforation as well as 3.6 cm abscess and SBO on 10/8.  Patient was treated with full resuscitation, Zosyn which was changed to [place] on 10/10.  Repeat CT abd imaging on 10/13 showed enlarging abscess with either localized ileus or mechanical obstructionand white count elevated to 17.  A & P     Acute sigmoid diverticulitis with abscess and associated ileus.  Minimal improvement with supportive care to increase in size based off recent CT imaging.  Underwent laparoscopic drainage of abscess on 10/14 by surgery, monitor on diet, as needed pain control-IV Dilaudid for severe every 3 as needed, tramadol every 6 hours as needed moderate, nausea control.   AKI, resolved.  Peak creatinine of 1.2, now back at baseline.  Likely prerenal in setting of diminished oral intake related to above.   Leukocytosis, slightly downtrending.  6.2 on 10/14, down from 17.  Remains afebrile, expect improvement after laparoscopic procedure, will repeat in a.m.   CAD status post stent in 9098.  Remains asymptomatic, continue aspirin   Hyperlipidemia, stable.  Continue Crestor   History of emphysema, no wheezing on exam, normal O2 requirements, albuterol nebs as needed.   HTN, stable continue atenolol      DVT prophylaxis: Lovenox Code Status: Full Family Communication: No family at bedside Disposition Plan: Monitor abdominal exam after each procedure, ability to tolerate oral intake, close monitoring of white count     [facility] Direct contact: see [email or web address]  (further directions at bottom of note if needed) 7PM-7AM contact night coverage as at bottom of note 03/29/2019, 6:58 PM  LOS: 9 days   Consultants   Surgery  Procedures   Laparoscopic abscess drainage, 10/14  Antibiotics   Zosyn, Invanz  Interval History/Subjective  Feels okay Feels like he had aspiration event overnight, but breathing is much fine today  Objective   Vitals:  Vitals:   03/29/19 1628 03/29/19 1735  BP: 124/63 129/83  Pulse: 63 67  Resp: 18 18  Temp: 98 F (36.7 C)   SpO2: 100% 100%    Exam:  Awake Alert, Oriented X 3, No new F.N deficits, Normal affect [place].AT, No JVD Normal respiratory effort on 2 L, no wheezing heard, Abdomen slightly distended, diminished bowel sounds, nontender to palpation    I have personally reviewed the following:   Data Reviewed: Basic Metabolic Panel: Recent Labs  Lab 03/23/19 0306 03/24/19 0253 03/25/19 0419 03/28/19 0359 03/29/19 0327 03/29/19 1730  NA 137 137 136 138 137  --   K 3.8 3.6 3.4* 3.3* 3.1*  --   CL 105 104 100 104 101  --   CO2 20* 23 24 24 23   --   GLUCOSE 87 82 82 97 87  --   BUN 25* 23 19 11 8   --   CREATININE 1.11 1.03 1.04 0.90 0.90  --   CALCIUM 8.2* 8.2* 8.1* 8.0* 8.0*  --   MG  --   --   --   --   --  1.9  PHOS  --   --  2.0*  --   --   --  Liver Function Tests: Recent Labs  Lab 03/25/19 0419 03/29/19 0327  AST  --  27  ALT  --  30  ALKPHOS  --  54  BILITOT  --  0.9  PROT  --  5.3*  ALBUMIN 2.5* 2.4*   No results for input(s): LIPASE, AMYLASE in the last 168 hours. No results for input(s): AMMONIA in the last 168 hours. CBC: Recent Labs  Lab 03/24/19 0253 03/25/19 0419 03/26/19 0410 03/27/19 0342 03/28/19 0359 03/29/19 0327  WBC 14.0* 14.3* 10.7* 11.9* 17.0* 16.2*  NEUTROABS 11.3* 11.1*  --  8.4* 13.9* 13.0*  HGB 11.6* 11.1* 11.9* 11.3* 11.3* 11.0*  HCT 35.5* 34.7* 36.9* 35.5* 34.8* 34.3*  MCV 92.9 93.3 92.3 94.7 92.8 93.5  PLT 275 297 318 334 342 336    Cardiac Enzymes: No results for input(s): CKTOTAL, CKMB, CKMBINDEX, TROPONINI in the last 168 hours. BNP (last 3 results) No results for input(s): BNP in the last 8760 hours.  ProBNP (last 3 results) No results for input(s): PROBNP in the last 8760 hours.  CBG: No results for input(s): GLUCAP in the last 168 hours.  Recent Results (from the past 240 hour(s))  SARS Coronavirus 2 [facility] order, Performed in [facility] hospital lab) Nasopharyngeal Nasopharyngeal Swab     Status: None   Collection Time: 03/20/19 10:06 AM   Specimen: Nasopharyngeal Swab  Result Value Ref Range Status   SARS Coronavirus 2 NEGATIVE NEGATIVE Final    Comment: (NOTE) If result is NEGATIVE SARS-CoV-2 target nucleic acids are NOT DETECTED. The SARS-CoV-2 RNA is generally detectable in upper and lower  respiratory specimens during the acute phase of infection. The lowest  concentration of SARS-CoV-2 viral copies this assay can detect is 250  copies / mL. A negative result does not preclude SARS-CoV-2 infection  and should not be used as the sole basis for treatment or other  patient management decisions.  A negative result may occur with  improper specimen collection / handling, submission of specimen other  than nasopharyngeal swab, presence of viral mutation(s) within the  areas targeted by this assay, and inadequate number of viral copies  (<250 copies / mL). A negative result must be combined with clinical  observations, patient history, and epidemiological information. If result is POSITIVE SARS-CoV-2 target nucleic acids are DETECTED. The SARS-CoV-2 RNA is generally detectable in upper and lower  respiratory specimens dur ing the acute phase of infection.  Positive  results are indicative of active infection with SARS-CoV-2.  Clinical  correlation with patient history and other diagnostic information is  necessary to determine patient infection status.  Positive results do  not rule out  bacterial infection or co-infection with other viruses. If result is PRESUMPTIVE POSTIVE SARS-CoV-2 nucleic acids MAY BE PRESENT.   A presumptive positive result was obtained on the submitted specimen  and confirmed on repeat testing.  While 2019 novel coronavirus  (SARS-CoV-2) nucleic acids may be present in the submitted sample  additional confirmatory testing may be necessary for epidemiological  and / or clinical management purposes  to differentiate between  SARS-CoV-2 and other Sarbecovirus currently known to infect humans.  If clinically indicated additional testing with an alternate test  methodology [phone]) is advised. The SARS-CoV-2 RNA is generally  detectable in upper and lower respiratory sp ecimens during the acute  phase of infection. The expected result is Negative. Fact Sheet for Patients:  StrictlyIdeas.no Fact Sheet for Healthcare Providers: BankingDealers.co.za This test is not yet approved or cleared by  the [employer] and has been authorized for detection and/or diagnosis of SARS-CoV-2 by FDA under an Emergency Use Authorization (EUA).  This EUA will remain in effect (meaning this test can be used) for the duration of the COVID-19 declaration under Section 564(b)(1) of the Act, 21 U.S.C. section 360bbb-3(b)(1), unless the authorization is terminated or revoked sooner. Performed at [facility], [place] [address], [place], [place] [address]   Culture, blood (Routine X 2) w Reflex to ID Panel     Status: None   Collection Time: 03/20/19  5:45 PM   Specimen: BLOOD  Result Value Ref Range Status   Specimen Description   Final    BLOOD LEFT ANTECUBITAL Performed at [facility], [place] [address], [place], [place] [address]    Special Requests   Final    BOTTLES DRAWN AEROBIC AND ANAEROBIC Blood Culture adequate volume Performed at [place] [address],  [place], [place] [address]    Culture   Final    NO GROWTH 5 DAYS Performed at [facility], [place] [address] [place], [place] [address]    Report Status 03/25/2019 FINAL  Final  Culture, blood (Routine X 2) w Reflex to ID Panel     Status: None   Collection Time: 03/20/19  5:49 PM   Specimen: BLOOD LEFT HAND  Result Value Ref Range Status   Specimen Description   Final    BLOOD LEFT HAND Performed at [place] [address], [place], [place] [address]    Special Requests   Final    BOTTLES DRAWN AEROBIC ONLY Blood Culture adequate volume Performed at [place] [address], [place], [place] [address]    Culture   Final    NO GROWTH 5 DAYS Performed at [facility], [place] [address], [place], [place] [address]    Report Status 03/25/2019 FINAL  Final  C difficile quick scan w PCR reflex     Status: None   Collection Time: 03/27/19  2:07 PM   Specimen: STOOL  Result Value Ref Range Status   C Diff antigen NEGATIVE NEGATIVE Final   C Diff toxin NEGATIVE NEGATIVE Final   C Diff interpretation No C. difficile detected.  Final    Comment: NEGATIVE Performed at [facility] [place], [place] [address], [place], [place] [address]   Surgical PCR screen     Status: None   Collection Time: 03/28/19  6:23 PM   Specimen: Nasal Mucosa; Nasal Swab  Result Value Ref Range Status   MRSA, PCR NEGATIVE NEGATIVE Final   Staphylococcus aureus NEGATIVE NEGATIVE Final    Comment: (NOTE) The Xpert SA Assay (FDA approved for NASAL specimens in patients 58 years of age and older), is one component of a comprehensive surveillance program. It is not intended to diagnose infection nor to guide or monitor treatment. Performed at [facility], [place] [address], [place], [place] [address]      Studies: Ct Abdomen Pelvis W Contrast  Result Date: 03/28/2019 CLINICAL DATA:  Perforated diverticulitis follow-up. EXAM: CT  ABDOMEN AND PELVIS WITH CONTRAST TECHNIQUE: Multidetector CT imaging of the abdomen and pelvis was performed using the standard protocol following bolus administration of intravenous contrast. CONTRAST:  18mL OMNIPAQUE IOHEXOL 300 MG/ML  SOLN COMPARISON:  03/23/2019 FINDINGS: Lower chest: Small bilateral pleural effusions, mildly increased in size, with associated atelectasis in both lower lobes, left greater than right. Lingular  subsegmental atelectasis. Hepatobiliary: Stable small hypodense hepatic lesions, likely cysts. Gallbladder unremarkable. Small Phrygian cap versus focal adenomyosis of the fundus of the gallbladder. Pancreas: Unremarkable Spleen: Unremarkable Adrenals/Urinary Tract: Unremarkable Stomach/Bowel: Sigmoid diverticulosis with active diverticulitis. Serpentine abscess above the sigmoid colon has at least 2 branching some with a similar configuration to the prior exam. Subjectively the complex branching abscess is mildly larger. A component of the abscess on image 65/2 measures 3.8 by 3.4 cm, previously 3.1 by 2.6 cm. There are air-fluid levels within the abscess. Separate from the abscess, I do not see any true free air currently. There is mesenteric and omental edema in the region of the abscesses. There are dilated loops of small bowel extending down into the pelvis into the region of the abscesses. The terminal ileum, which is just distal to the abscesses, is of normal caliber. The appendix is partially seen and slightly indistinct but I am skeptical of acute appendicitis. Vascular/Lymphatic: Aortoiliac atherosclerotic vascular disease. Scattered small porta hepatis and retroperitoneal lymph nodes are probably reactive. Reproductive: Unremarkable Other: Small amount of free pelvic fluid. Small amount of fluid along the paracolic gutters. Musculoskeletal: Acetabular spurring. Moderate degenerative chondral thinning in both hips. Right foraminal impingement at L5-S1 due to spurring as shown on  image 58/6. IMPRESSION: 1. There is a long tubular branching abscess in the pelvis, starting just above the area of active diverticulitis in the sigmoid colon, mildly enlarged compared to the prior exam. This has internal air-fluid levels but I do not see free intraperitoneal gas separate from the gas contained in the abscess. Surrounding mesenteric and omental stranding noted. 2. Dilated loops of small bowel extend down to the level of the abscess where loops of ileum are in close association with the abscess. The terminal ileum is distal to the abscess and is of normal caliber. Presumably the abscesses are causing either localized ileus or less likely mechanical obstruction. 3. Small bilateral pleural effusions are mildly increased in size. 4. Stable hypodense hepatic lesions, likely cysts. 5. Focal adenomyosis or a small Phrygian cap of the fundus of the gallbladder. 6.  Aortoiliac atherosclerotic vascular disease. 7. Small amount of ascites. 8. Degenerative hip arthropathy. 9. Right foraminal impingement at L5-S1. Electronically Signed   By: Van Clines M.D.   On: 03/28/2019 14:11    Scheduled Meds:  aspirin EC  81 mg Oral Daily   atenolol  12.5 mg Oral Daily   atenolol  25 mg Oral Daily   enoxaparin (LOVENOX) injection  40 mg Subcutaneous Q24H   pantoprazole  40 mg Oral Daily   potassium chloride  40 mEq Oral Q2H   sodium chloride flush  3 mL Intravenous Q12H   traZODone  50 mg Oral QHS   Continuous Infusions:  sodium chloride 40 mL/hr at 03/28/19 1400   ertapenem 1,000 mg (03/29/19 0849)    Principal Problem:   Diverticulitis of large intestine with perforation Active Problems:   Coronary artery disease PTCA and stents to proximal LAD in 1998, last stress test December 2018-   Dyslipidemia      Desiree Hane  [facility]

## 2019-03-29 NOTE — Care Management Important Message (Signed)
Important Message  Patient Details  Name: Andrew Solomon MRN: [id number] Date of Birth: Oct 31, 1950   Medicare Important Message Given:  Yes. CMA printed out the IM for the Case Management Nurse or the CSW to give to the patient.      Solenne Manwarren 03/29/2019, 8:26 AM

## 2019-03-29 NOTE — Interval H&P Note (Signed)
History and Physical Interval Note:  03/29/2019 12:14 PM  Andrew Solomon  has presented today for surgery, with the diagnosis of INTRA-ABDOMINAL ABSCESS.  The various methods of treatment have been discussed with the patient and family. After consideration of risks, benefits and other options for treatment, the patient has consented to  Procedure(s): LAPAROSCOPIC DRAINAGE INTRA-ABDOMINAL ABSCESS (N/A) as a surgical intervention.  The patient's history has been reviewed, patient examined, no change in status, stable for surgery.  I have reviewed the patient's chart and labs.  Questions were answered to the patient's satisfaction.     [name]

## 2019-03-29 NOTE — Op Note (Signed)
03/20/2019 - 03/29/2019  1:19 PM  PATIENT:  Andrew Solomon  68 y.o. male  PRE-OPERATIVE DIAGNOSIS: SIGMOID DIVERTICULITIS WITH ABSCESS  POST-OPERATIVE DIAGNOSIS: SIGMOID DIVERTICULITIS WITH ABSCESS  PROCEDURE:  Procedure(s): LAPAROSCOPIC DRAINAGE INTRA-ABDOMINAL ABSCESS (N/A)  SURGEON:  Surgeon(s) and Role:    * Jovita Kussmaul, MD - Primary  PHYSICIAN ASSISTANT:   ASSISTANTS: none   ANESTHESIA:   local and general  EBL:  5 mL   BLOOD ADMINISTERED:none  DRAINS: (1) Jackson-Pratt drain(s) with closed bulb suction in the pelvic abscess   LOCAL MEDICATIONS USED:  MARCAINE     SPECIMEN:  No Specimen  DISPOSITION OF SPECIMEN:  N/A  COUNTS:  YES  TOURNIQUET:  * No tourniquets in log *  DICTATION: .Dragon Dictation   After informed consent was obtained the patient was brought to the operating room and placed in the supine position on the operating table.  After adequate induction of general anesthesia the patient's abdomen was prepped with ChloraPrep, allowed to dry, and draped in usual sterile manner.  A site was chosen in the right upper quadrant for access in the abdominal cavity.  An appropriate timeout was performed.  The right upper quadrant area was infiltrated with quarter percent Marcaine.  A small stab incision was made with a 15 blade knife.  A 5 mm Optiview port and camera were used to bluntly dissected the layers of the abdominal wall under direct vision until access was gained to the abdominal cavity.  The abdomen was then insufflated with carbon dioxide without difficulty.  Another 5 mm port was placed under direct vision in the right lower quadrant.  A third 5 mm port was placed in the left midabdomen also under direct vision.  There was a large inflammatory area along the lower midline.  Much of this was able to be bluntly separated from the anterior abdominal wall with a sucker tip.  I was not able to reliably separated all of the loops of bowel adherent to this area.   I was able to access the abscess and drain this.  We did evacuate pus but did not see any fecal contamination.  At this point rather than risk injury to the bowel we decided to place a 19 [place] round Blake drain throughout the left mid abdominal wall port and placed the tip of the catheter into the abscess cavity.  The drain was anchored to the skin with a 2-0 nylon stitch.  At this point the rest of the abdominal cavity was relatively unremarkable.  The gas was allowed to escape and the ports were removed.  The drain was placed to bulb suction and there was a good seal.  The patient tolerated the procedure well.  At the end of the case all needle sponge and instrument counts were correct.  The patient was then awakened and taken to recovery in stable condition.  PLAN OF CARE: Admit to inpatient   PATIENT DISPOSITION:  PACU - hemodynamically stable.   Delay start of Pharmacological VTE agent (>24hrs) due to surgical blood loss or risk of bleeding: no

## 2019-03-29 NOTE — Anesthesia Postprocedure Evaluation (Signed)
Anesthesia Post Note  Patient: Andrew Solomon  Procedure(s) Performed: LAPAROSCOPIC DRAINAGE INTRA-ABDOMINAL ABSCESS (N/A )     Patient location during evaluation: PACU Anesthesia Type: General Level of consciousness: sedated Pain management: pain level controlled Vital Signs Assessment: post-procedure vital signs reviewed and stable Respiratory status: spontaneous breathing Cardiovascular status: stable Postop Assessment: no apparent nausea or vomiting Anesthetic complications: no    Last Vitals:  Vitals:   03/29/19 1415 03/29/19 1430  BP: 119/63 110/63  Pulse: 68 67  Resp: 14 17  Temp:    SpO2: 97% 95%    Last Pain:  Vitals:   03/29/19 1430  TempSrc:   PainSc: Asleep   Pain Goal: Patients Stated Pain Goal: 3 (03/29/19 0627)                 Huston Foley

## 2019-03-29 NOTE — Anesthesia Preprocedure Evaluation (Signed)
Anesthesia Evaluation  Patient identified by MRN, date of birth, ID band Patient awake    Reviewed: Allergy & Precautions, NPO status , Patient's Chart, lab work & pertinent test results  Airway Mallampati: I       Dental no notable dental hx. (+) Teeth Intact   Pulmonary former smoker,    Pulmonary exam normal breath sounds clear to auscultation       Cardiovascular hypertension, Pt. on home beta blockers + CAD and + Cardiac Stents  Normal cardiovascular exam Rhythm:Regular Rate:Normal     Neuro/Psych    GI/Hepatic Neg liver ROS, GERD  Medicated,  Endo/Other    Renal/GU      Musculoskeletal   Abdominal (+) + obese,   Peds  Hematology  (+) Blood dyscrasia, anemia ,   Anesthesia Other Findings   Reproductive/Obstetrics                             Anesthesia Physical Anesthesia Plan  ASA: III  Anesthesia Plan: General   Post-op Pain Management:    Induction: Intravenous  PONV Risk Score and Plan: 4 or greater and Ondansetron, Dexamethasone and Midazolam  Airway Management Planned: Oral ETT  Additional Equipment: None  Intra-op Plan:   Post-operative Plan: Extubation in OR  Informed Consent: I have reviewed the patients History and Physical, chart, labs and discussed the procedure including the risks, benefits and alternatives for the proposed anesthesia with the patient or authorized representative who has indicated his/her understanding and acceptance.     Dental advisory given  Plan Discussed with: CRNA  Anesthesia Plan Comments:         Anesthesia Quick Evaluation

## 2019-03-29 NOTE — Anesthesia Procedure Notes (Signed)
Procedure Name: Intubation Date/Time: 03/29/2019 12:28 PM Performed by: Claudia Desanctis, CRNA Pre-anesthesia Checklist: Patient identified, Emergency Drugs available, Suction available and Patient being monitored Patient Re-evaluated:Patient Re-evaluated prior to induction Oxygen Delivery Method: Circle system utilized Preoxygenation: Pre-oxygenation with 100% oxygen Induction Type: IV induction Ventilation: Mask ventilation without difficulty Laryngoscope Size: 2 and Miller Grade View: Grade I Tube type: Oral Tube size: 7.5 mm Number of attempts: 1 Airway Equipment and Method: Stylet Placement Confirmation: ETT inserted through vocal cords under direct vision,  positive ETCO2 and breath sounds checked- equal and bilateral Secured at: 24 cm Tube secured with: Tape Dental Injury: Teeth and Oropharynx as per pre-operative assessment

## 2019-03-29 NOTE — Transfer of Care (Signed)
Immediate Anesthesia Transfer of Care Note  Patient: Andrew Solomon  Procedure(s) Performed: LAPAROSCOPIC DRAINAGE INTRA-ABDOMINAL ABSCESS (N/A )  Patient Location: PACU  Anesthesia Type:General  Level of Consciousness: awake and patient cooperative  Airway & Oxygen Therapy: Patient Spontanous Breathing and Patient connected to face mask  Post-op Assessment: Report given to RN and Post -op Vital signs reviewed and stable  Post vital signs: Reviewed and stable  Last Vitals:  Vitals Value Taken Time  BP 124/67 03/29/19 1330  Temp    Pulse 70 03/29/19 1331  Resp 19 03/29/19 1331  SpO2 97 % 03/29/19 1331  Vitals shown include unvalidated device data.  Last Pain:  Vitals:   03/29/19 1122  TempSrc: Oral  PainSc:       Patients Stated Pain Goal: 3 ([id number])  Complications: No apparent anesthesia complications

## 2019-03-30 ENCOUNTER — Encounter (HOSPITAL_COMMUNITY): Payer: Self-pay | Admitting: General Surgery

## 2019-03-30 LAB — BASIC METABOLIC PANEL
Anion gap: 13 (ref 5–15)
BUN: 11 mg/dL (ref 8–23)
CO2: 20 mmol/L — ABNORMAL LOW (ref 22–32)
Calcium: 8.1 mg/dL — ABNORMAL LOW (ref 8.9–10.3)
Chloride: 104 mmol/L (ref 98–111)
Creatinine, Ser: 0.82 mg/dL (ref 0.61–1.24)
GFR calc Af Amer: >60 mL/min (ref >60)
GFR calc non Af Amer: >60 mL/min (ref >60)
Glucose, Bld: 103 mg/dL — ABNORMAL HIGH (ref 70–99)
Potassium: 4 mmol/L (ref 3.5–5.1)
Sodium: 137 mmol/L (ref 135–145)

## 2019-03-30 LAB — CBC
HCT: 34.8 % — ABNORMAL LOW (ref 39.0–52.0)
Hemoglobin: 11.1 g/dL — ABNORMAL LOW (ref 13.0–17.0)
MCH: 29.6 pg (ref 26.0–34.0)
MCHC: 31.9 g/dL (ref 30.0–36.0)
MCV: 92.8 fL (ref 80.0–100.0)
Platelets: 404 10*3/uL — ABNORMAL HIGH (ref 150–400)
RBC: 3.75 MIL/uL — ABNORMAL LOW (ref 4.22–5.81)
RDW: 13.1 % (ref 11.5–15.5)
WBC: 19.1 10*3/uL — ABNORMAL HIGH (ref 4.0–10.5)
nRBC: 0 % (ref 0.0–0.2)

## 2019-03-30 MED ORDER — SIMETHICONE 80 MG PO CHEW
80.0000 mg | CHEWABLE_TABLET | Freq: Four times a day (QID) | ORAL | Status: DC | PRN
  Administered 2019-03-30 – 2019-04-01 (×4): 80 mg via ORAL
  Filled 2019-03-30 (×5): qty 1

## 2019-03-30 MED ORDER — HYDROCODONE-ACETAMINOPHEN 5-325 MG PO TABS
1.0000 | ORAL_TABLET | ORAL | Status: DC | PRN
  Administered 2019-03-30 – 2019-03-31 (×5): 2 via ORAL
  Administered 2019-03-31: 1 via ORAL
  Administered 2019-03-31: 2 via ORAL
  Administered 2019-04-01 (×2): 1 via ORAL
  Administered 2019-04-01 – 2019-04-03 (×9): 2 via ORAL
  Administered 2019-04-04: 1 via ORAL
  Administered 2019-04-04 – 2019-04-05 (×5): 2 via ORAL
  Filled 2019-03-30 (×18): qty 2
  Filled 2019-03-30: qty 1
  Filled 2019-03-30 (×3): qty 2
  Filled 2019-03-30: qty 1
  Filled 2019-03-30: qty 2

## 2019-03-30 MED ORDER — HYDROCODONE-ACETAMINOPHEN 5-325 MG PO TABS: 1.0000 | ORAL_TABLET | ORAL | Status: DC | PRN

## 2019-03-30 MED ORDER — HYDROMORPHONE HCL 1 MG/ML IJ SOLN
0.5000 mg | INTRAMUSCULAR | Status: DC | PRN
  Administered 2019-03-30 – 2019-04-03 (×10): 1 mg via INTRAVENOUS
  Filled 2019-03-30 (×10): qty 1

## 2019-03-30 MED ORDER — HYDROCODONE-ACETAMINOPHEN 5-325 MG PO TABS
1.0000 | ORAL_TABLET | ORAL | Status: DC | PRN
  Administered 2019-03-30: 1 via ORAL
  Filled 2019-03-30: qty 1

## 2019-03-30 NOTE — Progress Notes (Signed)
[facility]  PROGRESS NOTE  Andrew Solomon [id number] DOB: 02/21/1951 DOA: 03/20/2019 PCP: Andrew Stains, MD  Brief History    Andrew Solomon is a 68 y.o. year old male with medical history significant for HTN, HLD, emphysema, CAD who presented on 03/20/2019 with worsening abdominal pain after eating, diarrhea, nausea and was found to have sigmoid diverticulitis with small perforation.  Repeat CT abdomen imaging showed perforation as well as 3.6 cm abscess and SBO on 10/8.  Patient was treated with full resuscitation, Zosyn which was changed to [place] on 10/10.  Repeat CT abd imaging on 10/13 showed enlarging abscess with either localized ileus or mechanical obstructionand white count elevated to 17.  A & P     Acute sigmoid diverticulitis with abscess and associated ileus.  Minimal improvement with supportive care to increase in size based off recent CT imaging.  Underwent laparoscopic drainage of abscess on 10/14 by surgery with drain placement, monitor on diet, as needed pain control-IV Dilaudid for severe every 3 as needed, tramadol every 6 hours as needed moderate, nausea control. Surgery monitoring drain output   AKI, resolved.  Peak creatinine of 1.2, now back at baseline.  Likely prerenal in setting of diminished oral intake related to above.   Leukocytosis, went up but in setting of recent surgery, likely stress leukocytosis.  Remains afebrile, expect improvement , will repeat in a.m.   CAD status post stent.  Remains asymptomatic, continue aspirin   Hyperlipidemia, stable.  Continue Crestor   History of emphysema, no wheezing on exam, normal O2 requirements, albuterol nebs as needed.   HTN, stable continue atenolol      DVT prophylaxis: Lovenox Code Status: Full Family Communication: No family at bedside Disposition Plan: Monitor abdominal exam and drain output, ability to tolerate oral intake, close monitoring of white count     [facility]  Direct contact: see [email or web address] (further directions at bottom of note if needed) 7PM-7AM contact night coverage as at bottom of note 03/30/2019, 8:54 PM  LOS: 10 days   Consultants  . Surgery  Procedures  . Laparoscopic abscess drainage, 10/14  Antibiotics  . Bebe Liter  Interval History/Subjective  Feels ok Had a big BM this morning  Objective   Vitals:  Vitals:   03/30/19 1340 03/30/19 1930  BP: 107/71   Pulse: 77   Resp:    Temp: 98.7 F (37.1 C)   SpO2: 93% 95%    Exam:  Awake Alert, Oriented X 3, No new F.N deficits, Normal affect [place].AT, No JVD Normal respiratory effort on 2 L, no wheezing heard, Abdomen slightly distended, diminished bowel sounds, nontender to palpation, drain in place with serosanguinous drainge    I have personally reviewed the following:   Data Reviewed: Basic Metabolic Panel: Recent Labs  Lab 03/24/19 0253 03/25/19 0419 03/28/19 0359 03/29/19 0327 03/29/19 1730 03/30/19 0303  NA 137 136 138 137  --  137  K 3.6 3.4* 3.3* 3.1*  --  4.0  CL 104 100 104 101  --  104  CO2 23 24 24 23   --  20*  GLUCOSE 82 82 97 87  --  103*  BUN 23 19 11 8   --  11  CREATININE 1.03 1.04 0.90 0.90  --  0.82  CALCIUM 8.2* 8.1* 8.0* 8.0*  --  8.1*  MG  --   --   --   --  1.9  --   PHOS  --  2.0*  --   --   --   --  Liver Function Tests: Recent Labs  Lab 03/25/19 0419 03/29/19 0327  AST  --  27  ALT  --  30  ALKPHOS  --  54  BILITOT  --  0.9  PROT  --  5.3*  ALBUMIN 2.5* 2.4*   No results for input(s): LIPASE, AMYLASE in the last 168 hours. No results for input(s): AMMONIA in the last 168 hours. CBC: Recent Labs  Lab 03/24/19 0253 03/25/19 0419 03/26/19 0410 03/27/19 0342 03/28/19 0359 03/29/19 0327 03/30/19 0303  WBC 14.0* 14.3* 10.7* 11.9* 17.0* 16.2* 19.1*  NEUTROABS 11.3* 11.1*  --  8.4* 13.9* 13.0*  --   HGB 11.6* 11.1* 11.9* 11.3* 11.3* 11.0* 11.1*  HCT 35.5* 34.7* 36.9* 35.5* 34.8* 34.3* 34.8*  MCV 92.9 93.3 92.3  94.7 92.8 93.5 92.8  PLT 275 297 318 334 342 336 404*   Cardiac Enzymes: No results for input(s): CKTOTAL, CKMB, CKMBINDEX, TROPONINI in the last 168 hours. BNP (last 3 results) No results for input(s): BNP in the last 8760 hours.  ProBNP (last 3 results) No results for input(s): PROBNP in the last 8760 hours.  CBG: No results for input(s): GLUCAP in the last 168 hours.  Recent Results (from the past 240 hour(s))  C difficile quick scan w PCR reflex     Status: None   Collection Time: 03/27/19  2:07 PM   Specimen: STOOL  Result Value Ref Range Status   C Diff antigen NEGATIVE NEGATIVE Final   C Diff toxin NEGATIVE NEGATIVE Final   C Diff interpretation No C. difficile detected.  Final    Comment: NEGATIVE Performed at [facility], [place] [address] [place], [place] [address]   Surgical PCR screen     Status: None   Collection Time: 03/28/19  6:23 PM   Specimen: Nasal Mucosa; Nasal Swab  Result Value Ref Range Status   MRSA, PCR NEGATIVE NEGATIVE Final   Staphylococcus aureus NEGATIVE NEGATIVE Final    Comment: (NOTE) The Xpert SA Assay (FDA approved for NASAL specimens in patients 79 years of age and older), is one component of a comprehensive surveillance program. It is not intended to diagnose infection nor to guide or monitor treatment. Performed at [facility], [place] [address] [place], [place] [address]      Studies: No results found.  Scheduled Meds: . aspirin EC  81 mg Oral Daily  . atenolol  12.5 mg Oral Daily  . atenolol  25 mg Oral Daily  . enoxaparin (LOVENOX) injection  40 mg Subcutaneous Q24H  . pantoprazole  40 mg Oral Daily  . sodium chloride flush  3 mL Intravenous Q12H  . traZODone  50 mg Oral QHS   Continuous Infusions: . sodium chloride 40 mL/hr at 03/30/19 1919  . ertapenem 1,000 mg (03/30/19 0919)    Principal Problem:   Diverticulitis of large intestine with perforation Active Problems:   Coronary  artery disease PTCA and stents to proximal LAD in 1998, last stress test December 2018-   Dyslipidemia   Essential hypertension   Ileus ([place])   Leukocytosis      Andrew Solomon  [facility]

## 2019-03-30 NOTE — Progress Notes (Signed)
1 Day Post-Op   Subjective/Chief Complaint: No complaints. Feels better   Objective: Vital signs in last 24 hours: Temp:  [98 F (36.7 C)-99.1 F (37.3 C)] 98.7 F (37.1 C) (10/15 0631) Pulse Rate:  [63-79] 79 (10/15 0631) Resp:  [14-20] 15 (10/15 0631) BP: (96-147)/(50-83) 135/54 (10/15 0631) SpO2:  [94 %-100 %] 98 % (10/15 0631) Last BM Date: 03/29/19  Intake/Output from previous day: 10/14 0701 - 10/15 0700 In: 940 [P.O.:240; I.V.:600; IV Piggyback:100] Out: 1110 [Urine:950; Drains:155; Blood:5] Intake/Output this shift: No intake/output data recorded.  General appearance: alert and cooperative Resp: clear to auscultation bilaterally Cardio: regular rate and rhythm GI: soft, tender suprapubic area. drain output serosanguinous  Lab Results:  Recent Labs    03/29/19 0327 03/30/19 0303  WBC 16.2* 19.1*  HGB 11.0* 11.1*  HCT 34.3* 34.8*  PLT 336 404*   BMET Recent Labs    03/29/19 0327 03/30/19 0303  NA 137 137  K 3.1* 4.0  CL 101 104  CO2 23 20*  GLUCOSE 87 103*  BUN 8 11  CREATININE 0.90 0.82  CALCIUM 8.0* 8.1*   PT/INR No results for input(s): LABPROT, INR in the last 72 hours. ABG No results for input(s): PHART, HCO3 in the last 72 hours.  Invalid input(s): PCO2, PO2  Studies/Results: Ct Abdomen Pelvis W Contrast  Result Date: 03/28/2019 CLINICAL DATA:  Perforated diverticulitis follow-up. EXAM: CT ABDOMEN AND PELVIS WITH CONTRAST TECHNIQUE: Multidetector CT imaging of the abdomen and pelvis was performed using the standard protocol following bolus administration of intravenous contrast. CONTRAST:  164mL OMNIPAQUE IOHEXOL 300 MG/ML  SOLN COMPARISON:  03/23/2019 FINDINGS: Lower chest: Small bilateral pleural effusions, mildly increased in size, with associated atelectasis in both lower lobes, left greater than right. Lingular subsegmental atelectasis. Hepatobiliary: Stable small hypodense hepatic lesions, likely cysts. Gallbladder unremarkable. Small  Phrygian cap versus focal adenomyosis of the fundus of the gallbladder. Pancreas: Unremarkable Spleen: Unremarkable Adrenals/Urinary Tract: Unremarkable Stomach/Bowel: Sigmoid diverticulosis with active diverticulitis. Ser[place]ine abscess above the sigmoid colon has at least 2 branching some with a similar configuration to the prior exam. Subjectively the complex branching abscess is mildly larger. A component of the abscess on image 65/2 measures 3.8 by 3.4 cm, previously 3.1 by 2.6 cm. There are air-fluid levels within the abscess. Separate from the abscess, I do not see any true free air currently. There is mesenteric and omental edema in the region of the abscesses. There are dilated loops of small bowel extending down into the pelvis into the region of the abscesses. The terminal ileum, which is just distal to the abscesses, is of normal caliber. The appendix is partially seen and slightly indistinct but I am skeptical of acute appendicitis. Vascular/Lymphatic: Aortoiliac atherosclerotic vascular disease. Scattered small porta hepatis and retroperitoneal lymph nodes are probably reactive. Reproductive: Unremarkable Other: Small amount of free pelvic fluid. Small amount of fluid along the paracolic gutters. Musculoskeletal: Acetabular spurring. Moderate degenerative chondral thinning in both hips. Right foraminal impingement at L5-S1 due to spurring as shown on image 58/6. IMPRESSION: 1. There is a long tubular branching abscess in the pelvis, starting just above the area of active diverticulitis in the sigmoid colon, mildly enlarged compared to the prior exam. This has internal air-fluid levels but I do not see free intraperitoneal gas separate from the gas contained in the abscess. Surrounding mesenteric and omental stranding noted. 2. Dilated loops of small bowel extend down to the level of the abscess where loops of ileum are in close  association with the abscess. The terminal ileum is distal to the abscess  and is of normal caliber. Presumably the abscesses are causing either localized ileus or less likely mechanical obstruction. 3. Small bilateral pleural effusions are mildly increased in size. 4. Stable hypodense hepatic lesions, likely cysts. 5. Focal adenomyosis or a small Phrygian cap of the fundus of the gallbladder. 6.  Aortoiliac atherosclerotic vascular disease. 7. Small amount of ascites. 8. Degenerative hip arthropathy. 9. Right foraminal impingement at L5-S1. Electronically Signed   By: Van Clines M.D.   On: 03/28/2019 14:11    Anti-infectives: Anti-infectives (From admission, onward)   Start     Dose/Rate Route Frequency Ordered Stop   03/24/19 0800  ertapenem (INVANZ) 1,000 mg in sodium chloride 0.9 % 100 mL IVPB     1 g 200 mL/hr over 30 Minutes Intravenous Every 24 hours 03/24/19 0738     03/20/19 2000  piperacillin-tazobactam (ZOSYN) IVPB 3.375 g  Status:  Discontinued     3.375 g 12.5 mL/hr over 240 Minutes Intravenous Every 8 hours 03/20/19 1701 03/24/19 0739   03/20/19 1500  piperacillin-tazobactam (ZOSYN) IVPB 3.375 g  Status:  Discontinued     3.375 g 100 mL/hr over 30 Minutes Intravenous Every 8 hours 03/20/19 1453 03/20/19 1701   03/20/19 1315  piperacillin-tazobactam (ZOSYN) IVPB 3.375 g     3.375 g 100 mL/hr over 30 Minutes Intravenous  Once 03/20/19 1305 03/20/19 1521      Assessment/Plan: s/p Procedure(s): LAPAROSCOPIC DRAINAGE INTRA-ABDOMINAL ABSCESS (N/A) Advance diet  Diverticulitis with abscess. Pod 1 from drain. Wbc went up but he did have surgery yesterday. Will continue IV abx Recheck wbc tomorrow  LOS: 10 days    [name] 03/30/2019

## 2019-03-31 LAB — CBC
HCT: 35.2 % — ABNORMAL LOW (ref 39.0–52.0)
Hemoglobin: 11.2 g/dL — ABNORMAL LOW (ref 13.0–17.0)
MCH: 29.9 pg (ref 26.0–34.0)
MCHC: 31.8 g/dL (ref 30.0–36.0)
MCV: 93.9 fL (ref 80.0–100.0)
Platelets: 409 10*3/uL — ABNORMAL HIGH (ref 150–400)
RBC: 3.75 MIL/uL — ABNORMAL LOW (ref 4.22–5.81)
RDW: 13.3 % (ref 11.5–15.5)
WBC: 17.6 10*3/uL — ABNORMAL HIGH (ref 4.0–10.5)
nRBC: 0 % (ref 0.0–0.2)

## 2019-03-31 MED ORDER — ENSURE ENLIVE PO LIQD
237.0000 mL | Freq: Two times a day (BID) | ORAL | Status: DC
  Administered 2019-03-31 – 2019-04-06 (×12): 237 mL via ORAL

## 2019-03-31 NOTE — Progress Notes (Signed)
[facility]  PROGRESS NOTE  Andrew Solomon [id number] DOB: 07-Oct-1950 DOA: 03/20/2019 PCP: Harlan Stains, MD  Brief History    Andrew Solomon is a 68 y.o. year old male with medical history significant for HTN, HLD, emphysema, CAD who presented on 03/20/2019 with worsening abdominal pain after eating, diarrhea, nausea and was found to have sigmoid diverticulitis with small perforation.  Repeat CT abdomen imaging showed perforation as well as 3.6 cm abscess and SBO on 10/8.  Patient was treated with full resuscitation, Zosyn which was changed to [place] on 10/10.  Repeat CT abd imaging on 10/13 showed enlarging abscess with either localized ileus or mechanical obstructionand white count elevated to 17.  Patient underwent laparoscopic drainage of abscess  A & P     Acute sigmoid diverticulitis with abscess and associated ileus.  Minimal improvement (still having flatus, large BM this a.m.), still having intermittent pain, abdomen quite distended but not toxic, white count coming down slowly, remains afebrile.  Continue IV pain control, IV antibiotics, hopeful can continue to manage without further surgical intervention, diet is liberalized but not eating much, agree with Ensure supplementation.  Surgery monitoring drain output   AKI, resolved.  Peak creatinine of 1.2, now back at baseline.  Likely prerenal in setting of diminished oral intake related to above.   Leukocytosis, went up but in setting of recent surgery and is now slowly downtrending, likely stress leukocytosis.  Remains afebrile, closely monitor   CAD status post stent.  Remains asymptomatic, continue aspirin   Hyperlipidemia, stable.  Continue Crestor   History of emphysema, no wheezing on exam, normal O2 requirements, albuterol nebs as needed.   HTN, stable continue atenolol      DVT prophylaxis: Lovenox Code Status: Full Family Communication: No family at bedside Disposition Plan: Monitor abdominal exam  and drain output, ability to tolerate oral intake, close monitoring of white count     [facility] Direct contact: see [email or web address] (further directions at bottom of note if needed) 7PM-7AM contact night coverage as at bottom of note 03/31/2019, 2:43 PM  LOS: 11 days   Consultants  . Surgery  Procedures  . Laparoscopic abscess drainage, 10/14  Antibiotics  . Zosyn, Invanz  Interval History/Subjective  Having flatus Large BM this a.m. Still having some intermittent abdominal pain Hopeful to not need repeat surgery  Objective   Vitals:  Vitals:   03/31/19 0841 03/31/19 1353  BP:  (!) 112/57  Pulse:  76  Resp:  18  Temp:  98.2 F (36.8 C)  SpO2: 92% 96%    Exam:  Awake Alert, Oriented X 3, No new F.N deficits, Normal affect No JVD Normal respiratory effort on 2 L, no wheezing heard, Abdomen distended but soft, slightly tender with palpation, bowel sounds heard, drain in place with minimal serosanguineous output, no rebound tenderness or guarding,     I have personally reviewed the following:   Data Reviewed: Basic Metabolic Panel: Recent Labs  Lab 03/25/19 0419 03/28/19 0359 03/29/19 0327 03/29/19 1730 03/30/19 0303  NA 136 138 137  --  137  K 3.4* 3.3* 3.1*  --  4.0  CL 100 104 101  --  104  CO2 24 24 23   --  20*  GLUCOSE 82 97 87  --  103*  BUN 19 11 8   --  11  CREATININE 1.04 0.90 0.90  --  0.82  CALCIUM 8.1* 8.0* 8.0*  --  8.1*  MG  --   --   --  1.9  --   PHOS 2.0*  --   --   --   --    Liver Function Tests: Recent Labs  Lab 03/25/19 0419 03/29/19 0327  AST  --  27  ALT  --  30  ALKPHOS  --  54  BILITOT  --  0.9  PROT  --  5.3*  ALBUMIN 2.5* 2.4*   No results for input(s): LIPASE, AMYLASE in the last 168 hours. No results for input(s): AMMONIA in the last 168 hours. CBC: Recent Labs  Lab 03/25/19 0419  03/27/19 0342 03/28/19 0359 03/29/19 0327 03/30/19 0303 03/31/19 0318  WBC 14.3*   < > 11.9* 17.0* 16.2* 19.1* 17.6*   NEUTROABS 11.1*  --  8.4* 13.9* 13.0*  --   --   HGB 11.1*   < > 11.3* 11.3* 11.0* 11.1* 11.2*  HCT 34.7*   < > 35.5* 34.8* 34.3* 34.8* 35.2*  MCV 93.3   < > 94.7 92.8 93.5 92.8 93.9  PLT 297   < > 334 342 336 404* 409*   < > = values in this interval not displayed.   Cardiac Enzymes: No results for input(s): CKTOTAL, CKMB, CKMBINDEX, TROPONINI in the last 168 hours. BNP (last 3 results) No results for input(s): BNP in the last 8760 hours.  ProBNP (last 3 results) No results for input(s): PROBNP in the last 8760 hours.  CBG: No results for input(s): GLUCAP in the last 168 hours.  Recent Results (from the past 240 hour(s))  C difficile quick scan w PCR reflex     Status: None   Collection Time: 03/27/19  2:07 PM   Specimen: STOOL  Result Value Ref Range Status   C Diff antigen NEGATIVE NEGATIVE Final   C Diff toxin NEGATIVE NEGATIVE Final   C Diff interpretation No C. difficile detected.  Final    Comment: NEGATIVE Performed at [facility], [place] [address], [place], [place] [address]   Surgical PCR screen     Status: None   Collection Time: 03/28/19  6:23 PM   Specimen: Nasal Mucosa; Nasal Swab  Result Value Ref Range Status   MRSA, PCR NEGATIVE NEGATIVE Final   Staphylococcus aureus NEGATIVE NEGATIVE Final    Comment: (NOTE) The Xpert SA Assay (FDA approved for NASAL specimens in patients 84 years of age and older), is one component of a comprehensive surveillance program. It is not intended to diagnose infection nor to guide or monitor treatment. Performed at [facility], [place] [address], [place], [place] [address]      Studies: No results found.  Scheduled Meds: . aspirin EC  81 mg Oral Daily  . atenolol  12.5 mg Oral Daily  . atenolol  25 mg Oral Daily  . enoxaparin (LOVENOX) injection  40 mg Subcutaneous Q24H  . feeding supplement (ENSURE ENLIVE)  237 mL Oral BID BM  . pantoprazole  40 mg Oral Daily  . sodium chloride  flush  3 mL Intravenous Q12H  . traZODone  50 mg Oral QHS   Continuous Infusions: . sodium chloride 40 mL/hr at 03/31/19 1400  . ertapenem 1,000 mg (03/31/19 0744)    Principal Problem:   Diverticulitis of large intestine with perforation Active Problems:   Coronary artery disease PTCA and stents to proximal LAD in 1998, last stress test December 2018-   Dyslipidemia   Essential hypertension   Ileus ([place])   Leukocytosis      Shayla D Nettey  [facility]

## 2019-03-31 NOTE — Progress Notes (Signed)
Patient ID: Andrew Solomon, male   DOB: 29-Jun-1950, 68 y.o.   MRN: [id number]    2 Days Post-Op  Subjective: Passed a lot of flatus this morning and had a BM.  Still having gas pains.  Doesn't feel bloated, but clearly is.  Not hardly eating much.  Trying to get up and mobilize  Objective: Vital signs in last 24 hours: Temp:  [97.7 F (36.5 C)-99 F (37.2 C)] 99 F (37.2 C) (10/16 0529) Pulse Rate:  [73-78] 78 (10/16 0529) Resp:  [18] 18 (10/16 0529) BP: (107-126)/(50-71) 126/60 (10/16 0529) SpO2:  [92 %-99 %] 92 % (10/16 0841) Last BM Date: 03/30/19  Intake/Output from previous day: 10/15 0701 - 10/16 0700 In: 2024.9 [P.O.:960; I.V.:964.9; IV Piggyback:100] Out: 760 [Urine:700; Drains:60] Intake/Output this shift: No intake/output data recorded.  PE: Heart: regular Lungs: CTAB Abd: distended, some BS, tender diffusely, but less than he was, incisions c/d/i.  JP drain with minimal serous drainage  Lab Results:  Recent Labs    03/30/19 0303 03/31/19 0318  WBC 19.1* 17.6*  HGB 11.1* 11.2*  HCT 34.8* 35.2*  PLT 404* 409*   BMET Recent Labs    03/29/19 0327 03/30/19 0303  NA 137 137  K 3.1* 4.0  CL 101 104  CO2 23 20*  GLUCOSE 87 103*  BUN 8 11  CREATININE 0.90 0.82  CALCIUM 8.0* 8.1*   PT/INR No results for input(s): LABPROT, INR in the last 72 hours. CMP     Component Value Date/Time   NA 137 03/30/2019 0303   K 4.0 03/30/2019 0303   CL 104 03/30/2019 0303   CO2 20 (L) 03/30/2019 0303   GLUCOSE 103 (H) 03/30/2019 0303   BUN 11 03/30/2019 0303   CREATININE 0.82 03/30/2019 0303   CALCIUM 8.1 (L) 03/30/2019 0303   PROT 5.3 (L) 03/29/2019 0327   PROT 6.9 08/05/2018 1431   ALBUMIN 2.4 (L) 03/29/2019 0327   ALBUMIN 4.5 08/05/2018 1431   AST 27 03/29/2019 0327   ALT 30 03/29/2019 0327   ALKPHOS 54 03/29/2019 0327   BILITOT 0.9 03/29/2019 0327   BILITOT 0.4 08/05/2018 1431   GFRNONAA >60 03/30/2019 0303   GFRAA >60 03/30/2019 0303   Lipase      Component Value Date/Time   LIPASE 29 03/20/2019 1006       Studies/Results: No results found.  Anti-infectives: Anti-infectives (From admission, onward)   Start     Dose/Rate Route Frequency Ordered Stop   03/24/19 0800  ertapenem (INVANZ) 1,000 mg in sodium chloride 0.9 % 100 mL IVPB     1 g 200 mL/hr over 30 Minutes Intravenous Every 24 hours 03/24/19 0738     03/20/19 2000  piperacillin-tazobactam (ZOSYN) IVPB 3.375 g  Status:  Discontinued     3.375 g 12.5 mL/hr over 240 Minutes Intravenous Every 8 hours 03/20/19 1701 03/24/19 0739   03/20/19 1500  piperacillin-tazobactam (ZOSYN) IVPB 3.375 g  Status:  Discontinued     3.375 g 100 mL/hr over 30 Minutes Intravenous Every 8 hours 03/20/19 1453 03/20/19 1701   03/20/19 1315  piperacillin-tazobactam (ZOSYN) IVPB 3.375 g     3.375 g 100 mL/hr over 30 Minutes Intravenous  Once 03/20/19 1305 03/20/19 1521       Assessment/Plan HTN HLD CAD, s/p stenting Diarrhea - C. Diff negative  POD 2, s/p laparoscopic abscess drainage and washout for diverticulitis, Dr. Marlou Starks 10/14 -WBC mildly improved at 17K - he's still quite bloated and not eating much -  still with abdominal pain, but slightly less -mobilize, pulm toilet -will continue to watch, not sure he is going to improve enough for him to avoid a Hartman's procedure this admission, but hopefully he will start to turn the corner -add ensure BID today to give him some extra nutrients and protein while he isn't eating much at all  FEN -Soft diet VTE -Scds, Lovenox  ID -zosyn 10/5 - 10/9. Invanz 10/9 >>   LOS: 11 days    Andrew Solomon , [name] Surgery 03/31/2019, 8:52 AM Please see Amion for pager number during day hours 7:00am-4:30pm

## 2019-04-01 LAB — CBC
HCT: 34.6 % — ABNORMAL LOW (ref 39.0–52.0)
Hemoglobin: 10.7 g/dL — ABNORMAL LOW (ref 13.0–17.0)
MCH: 29.2 pg (ref 26.0–34.0)
MCHC: 30.9 g/dL (ref 30.0–36.0)
MCV: 94.5 fL (ref 80.0–100.0)
Platelets: 384 10*3/uL (ref 150–400)
RBC: 3.66 MIL/uL — ABNORMAL LOW (ref 4.22–5.81)
RDW: 13.3 % (ref 11.5–15.5)
WBC: 13.9 10*3/uL — ABNORMAL HIGH (ref 4.0–10.5)
nRBC: 0 % (ref 0.0–0.2)

## 2019-04-01 NOTE — Progress Notes (Signed)
[facility]  PROGRESS NOTE  Andrew Solomon [id number] DOB: 1951-01-31 DOA: 03/20/2019 PCP: Harlan Stains, MD  Brief History    Andrew Solomon is a 68 y.o. year old male with medical history significant for HTN, HLD, emphysema, CAD who presented on 03/20/2019 with worsening abdominal pain after eating, diarrhea, nausea and was found to have sigmoid diverticulitis with small perforation.  Repeat CT abdomen imaging showed perforation as well as 3.6 cm abscess and SBO on 10/8.  Patient was treated with full resuscitation, Zosyn which was changed to [place] on 10/10.  Repeat CT abd imaging on 10/13 showed enlarging abscess with either localized ileus or mechanical obstructionand white count elevated to 17.  Patient underwent laparoscopic drainage of abscess  A & P     Acute sigmoid diverticulitis with abscess and associated ileus, stable.  Continues to have BMs, passing flatus, abdomen seems less distended, white count coming down slowly and remains afebrile.   Continue IV pain control, IV Invanz, hopeful can continue to manage without further surgical intervention, diet is liberalized but not eating much, agree with Ensure supplementation.  Surgery monitoring drain output   AKI, resolved.  Peak creatinine of 1.2, now back at baseline.  Likely prerenal in setting of diminished oral intake related to above.   Leukocytosis, went up but in setting of recent surgery and is now slowly downtrending, likely stress leukocytosis.  Remains afebrile, closely monitor   CAD status post stent.  Remains asymptomatic, continue aspirin   Hyperlipidemia, stable.  Continue Crestor   History of emphysema, no wheezing on exam, normal O2 requirements, albuterol nebs as needed.   HTN, stable continue atenolol      DVT prophylaxis: Lovenox Code Status: Full Family Communication: No family at bedside Disposition Plan: Monitor abdominal exam and drain output, ability to tolerate oral intake, close  monitoring of white count     [facility] Direct contact: see [email or web address] (further directions at bottom of note if needed) 7PM-7AM contact night coverage as at bottom of note 04/01/2019, 3:39 PM  LOS: 12 days   Consultants  . Surgery  Procedures  . Laparoscopic abscess drainage, 10/14  Antibiotics  . Zosyn, Invanz  Interval History/Subjective  Still having BMs, passing flatus, reports improvement in abdominal distention  Objective   Vitals:  Vitals:   04/01/19 0812 04/01/19 1331  BP:  114/69  Pulse:  74  Resp:  16  Temp:  98.9 F (37.2 C)  SpO2: 93% 96%    Exam:  Awake Alert, Oriented X 3, No new F.N deficits, Normal affect No JVD Normal respiratory effort on 2 L, no wheezing heard, Abdomen distended but soft, slightly tender with palpation, bowel sounds heard, drain in place with minimal clear fluid output, no rebound tenderness or guarding,     I have personally reviewed the following:   Data Reviewed: Basic Metabolic Panel: Recent Labs  Lab 03/28/19 0359 03/29/19 0327 03/29/19 1730 03/30/19 0303  NA 138 137  --  137  K 3.3* 3.1*  --  4.0  CL 104 101  --  104  CO2 24 23  --  20*  GLUCOSE 97 87  --  103*  BUN 11 8  --  11  CREATININE 0.90 0.90  --  0.82  CALCIUM 8.0* 8.0*  --  8.1*  MG  --   --  1.9  --    Liver Function Tests: Recent Labs  Lab 03/29/19 0327  AST 27  ALT 30  ALKPHOS 54  BILITOT 0.9  PROT 5.3*  ALBUMIN 2.4*   No results for input(s): LIPASE, AMYLASE in the last 168 hours. No results for input(s): AMMONIA in the last 168 hours. CBC: Recent Labs  Lab 03/27/19 0342 03/28/19 0359 03/29/19 0327 03/30/19 0303 03/31/19 0318 04/01/19 0337  WBC 11.9* 17.0* 16.2* 19.1* 17.6* 13.9*  NEUTROABS 8.4* 13.9* 13.0*  --   --   --   HGB 11.3* 11.3* 11.0* 11.1* 11.2* 10.7*  HCT 35.5* 34.8* 34.3* 34.8* 35.2* 34.6*  MCV 94.7 92.8 93.5 92.8 93.9 94.5  PLT 334 342 336 404* 409* 384   Cardiac Enzymes: No results for input(s):  CKTOTAL, CKMB, CKMBINDEX, TROPONINI in the last 168 hours. BNP (last 3 results) No results for input(s): BNP in the last 8760 hours.  ProBNP (last 3 results) No results for input(s): PROBNP in the last 8760 hours.  CBG: No results for input(s): GLUCAP in the last 168 hours.  Recent Results (from the past 240 hour(s))  C difficile quick scan w PCR reflex     Status: None   Collection Time: 03/27/19  2:07 PM   Specimen: STOOL  Result Value Ref Range Status   C Diff antigen NEGATIVE NEGATIVE Final   C Diff toxin NEGATIVE NEGATIVE Final   C Diff interpretation No C. difficile detected.  Final    Comment: NEGATIVE Performed at [facility], [place] [address] [place], [place] [address]   Surgical PCR screen     Status: None   Collection Time: 03/28/19  6:23 PM   Specimen: Nasal Mucosa; Nasal Swab  Result Value Ref Range Status   MRSA, PCR NEGATIVE NEGATIVE Final   Staphylococcus aureus NEGATIVE NEGATIVE Final    Comment: (NOTE) The Xpert SA Assay (FDA approved for NASAL specimens in patients 63 years of age and older), is one component of a comprehensive surveillance program. It is not intended to diagnose infection nor to guide or monitor treatment. Performed at [facility], [place] [address], [place],  [address]      Studies: No results found.  Scheduled Meds: . aspirin EC  81 mg Oral Daily  . atenolol  12.5 mg Oral Daily  . atenolol  25 mg Oral Daily  . enoxaparin (LOVENOX) injection  40 mg Subcutaneous Q24H  . feeding supplement (ENSURE ENLIVE)  237 mL Oral BID BM  . pantoprazole  40 mg Oral Daily  . sodium chloride flush  3 mL Intravenous Q12H  . traZODone  50 mg Oral QHS   Continuous Infusions: . sodium chloride 40 mL/hr at 04/01/19 1000  . ertapenem Stopped (04/01/19 [id number])    Principal Problem:   Diverticulitis of large intestine with perforation Active Problems:   Coronary artery disease PTCA and stents to proximal  LAD in 1998, last stress test December 2018-   Dyslipidemia   Essential hypertension   Ileus ([place])   Leukocytosis      Andrew Solomon  [facility]

## 2019-04-01 NOTE — Progress Notes (Signed)
     Assessment & Plan: POD#2 - s/p laparoscopic abscess drainage and washout for diverticulitis, Dr. Marlou Starks 10/14 -WBC improved to 13K - tolerating soft diet with flatus and loose BM's - drain with serous output - encouraged ambulation, OOB - continue Invanz IV  FEN -Soft diet VTE -Scds, Lovenox  ID -zosyn 10/5 -10/9.Invanz 10/9 >> HTN HLD CAD, s/p stenting Diarrhea - C. Diff negative        Armandina Gemma, MD       [facility]       Office: [phone]   Chief Complaint: Diverticulitis with abscess  Subjective: Patient in bed, alert, responsive.  No complaints this AM.  Passing flatus, loose BM's.  Objective: Vital signs in last 24 hours: Temp:  [98.2 F (36.8 C)-98.9 F (37.2 C)] 98.9 F (37.2 C) (10/17 0441) Pulse Rate:  [71-76] 71 (10/17 0441) Resp:  [17-18] 17 (10/17 0441) BP: (112-124)/(57-70) 124/70 (10/17 0441) SpO2:  [92 %-98 %] 98 % (10/17 0441) Last BM Date: 03/31/19  Intake/Output from previous day: 10/16 0701 - 10/17 0700 In: 1500.9 [P.O.:480; I.V.:920.9; IV Piggyback:100] Out: 505 [Urine:500; Drains:5] Intake/Output this shift: No intake/output data recorded.  Physical Exam: HEENT - sclerae clear, mucous membranes moist Neck - soft Chest - clear bilaterally Cor - RRR Abdomen - protuberant, mildly distended; drain with serous output; BS present Ext - no edema, non-tender Neuro - alert & oriented, no focal deficits  Lab Results:  Recent Labs    03/31/19 0318 04/01/19 0337  WBC 17.6* 13.9*  HGB 11.2* 10.7*  HCT 35.2* 34.6*  PLT 409* 384   BMET Recent Labs    03/30/19 0303  NA 137  K 4.0  CL 104  CO2 20*  GLUCOSE 103*  BUN 11  CREATININE 0.82  CALCIUM 8.1*   PT/INR No results for input(s): LABPROT, INR in the last 72 hours. Comprehensive Metabolic Panel:    Component Value Date/Time   NA 137 03/30/2019 0303   NA 137 03/29/2019 0327   K 4.0 03/30/2019 0303   K 3.1 (L) 03/29/2019 0327   CL 104  03/30/2019 0303   CL 101 03/29/2019 0327   CO2 20 (L) 03/30/2019 0303   CO2 23 03/29/2019 0327   BUN 11 03/30/2019 0303   BUN 8 03/29/2019 0327   CREATININE 0.82 03/30/2019 0303   CREATININE 0.90 03/29/2019 0327   GLUCOSE 103 (H) 03/30/2019 0303   GLUCOSE 87 03/29/2019 0327   CALCIUM 8.1 (L) 03/30/2019 0303   CALCIUM 8.0 (L) 03/29/2019 0327   AST 27 03/29/2019 0327   AST 26 03/21/2019 0326   ALT 30 03/29/2019 0327   ALT 37 03/21/2019 0326   ALKPHOS 54 03/29/2019 0327   ALKPHOS 45 03/21/2019 0326   BILITOT 0.9 03/29/2019 0327   BILITOT 1.1 03/21/2019 0326   BILITOT 0.4 08/05/2018 1431   PROT 5.3 (L) 03/29/2019 0327   PROT 6.2 (L) 03/21/2019 0326   PROT 6.9 08/05/2018 1431   ALBUMIN 2.4 (L) 03/29/2019 0327   ALBUMIN 2.5 (L) 03/25/2019 0419   ALBUMIN 4.5 08/05/2018 1431    Studies/Results: No results found.    Armandina Gemma 04/01/2019  Patient ID: Erle Crocker, male   DOB: August 09, 1950, 68 y.o.   MRN: [id number]

## 2019-04-01 NOTE — Plan of Care (Signed)
Patient lying in bed this morning; complains of moderate pain in abdomen and requesting pain medication. Wants to get up to chair before breakfast but requests to wait a few minutes for pain medication to begin working. Will continue to monitor.

## 2019-04-02 DIAGNOSIS — I10 Essential (primary) hypertension: Secondary | ICD-10-CM | POA: Diagnosis not present

## 2019-04-02 DIAGNOSIS — K567 Ileus, unspecified: Secondary | ICD-10-CM | POA: Diagnosis not present

## 2019-04-02 DIAGNOSIS — D72829 Elevated white blood cell count, unspecified: Secondary | ICD-10-CM | POA: Diagnosis not present

## 2019-04-02 LAB — CBC
HCT: 32 % — ABNORMAL LOW (ref 39.0–52.0)
Hemoglobin: 10 g/dL — ABNORMAL LOW (ref 13.0–17.0)
MCH: 30 pg (ref 26.0–34.0)
MCHC: 31.3 g/dL (ref 30.0–36.0)
MCV: 96.1 fL (ref 80.0–100.0)
Platelets: 408 10*3/uL — ABNORMAL HIGH (ref 150–400)
RBC: 3.33 MIL/uL — ABNORMAL LOW (ref 4.22–5.81)
RDW: 13.5 % (ref 11.5–15.5)
WBC: 10.6 10*3/uL — ABNORMAL HIGH (ref 4.0–10.5)
nRBC: 0 % (ref 0.0–0.2)

## 2019-04-02 MED ORDER — LIP MEDEX EX OINT
TOPICAL_OINTMENT | CUTANEOUS | Status: AC
  Administered 2019-04-02: 1
  Filled 2019-04-02: qty 7

## 2019-04-02 NOTE — Progress Notes (Signed)
     Assessment & Plan: POD#4 - s/p laparoscopic abscess drainage and washout fordiverticulitis, Dr. Marlou Starks 10/14 -WBC improved to 10.6K this AM -tolerating soft diet with flatus and more formed BM's - drain with serous output - encouraged ambulation, OOB - continue Invanz IV  Will consider repeat CT scan on Monday 10/19 followed by possible drain removal.  Per Dr. Barry Dienes tomorrow.  FEN -Soft diet VTE -Scds, Lovenox  ID -zosyn 10/5 -10/9.Invanz 10/9 >> HTN HLD CAD, s/p stenting Diarrhea - C. Diff negative, improving        Armandina Gemma, MD       [facility]       Office: [phone]   Chief Complaint: Perforated diverticulitis with abscess  Subjective: Patient up in chair, feels better.  Wants to move along and go home.  Tolerating diet.  BM's more formed this AM.  Objective: Vital signs in last 24 hours: Temp:  [98.3 F (36.8 C)-99 F (37.2 C)] 99 F (37.2 C) (10/18 0533) Pulse Rate:  [74-75] 75 (10/18 0533) Resp:  [16-20] 18 (10/18 0533) BP: (114-121)/(62-69) 121/62 (10/18 0533) SpO2:  [93 %-100 %] 93 % (10/18 0533) Last BM Date: 04/02/19  Intake/Output from previous day: 10/17 0701 - 10/18 0700 In: 739.3 [P.O.:180; I.V.:459.3; IV Piggyback:100] Out: 510 [Urine:500; Drains:10] Intake/Output this shift: No intake/output data recorded.  Physical Exam: HEENT - sclerae clear, mucous membranes moist Neck - soft Abdomen - softer, less distended; drain with small serous output Ext - non-tender Neuro - alert & oriented, no focal deficits  Lab Results:  Recent Labs    04/01/19 0337 04/02/19 0328  WBC 13.9* 10.6*  HGB 10.7* 10.0*  HCT 34.6* 32.0*  PLT 384 408*   BMET No results for input(s): NA, K, CL, CO2, GLUCOSE, BUN, CREATININE, CALCIUM in the last 72 hours. PT/INR No results for input(s): LABPROT, INR in the last 72 hours. Comprehensive Metabolic Panel:    Component Value Date/Time   NA 137 03/30/2019 0303   NA 137  03/29/2019 0327   K 4.0 03/30/2019 0303   K 3.1 (L) 03/29/2019 0327   CL 104 03/30/2019 0303   CL 101 03/29/2019 0327   CO2 20 (L) 03/30/2019 0303   CO2 23 03/29/2019 0327   BUN 11 03/30/2019 0303   BUN 8 03/29/2019 0327   CREATININE 0.82 03/30/2019 0303   CREATININE 0.90 03/29/2019 0327   GLUCOSE 103 (H) 03/30/2019 0303   GLUCOSE 87 03/29/2019 0327   CALCIUM 8.1 (L) 03/30/2019 0303   CALCIUM 8.0 (L) 03/29/2019 0327   AST 27 03/29/2019 0327   AST 26 03/21/2019 0326   ALT 30 03/29/2019 0327   ALT 37 03/21/2019 0326   ALKPHOS 54 03/29/2019 0327   ALKPHOS 45 03/21/2019 0326   BILITOT 0.9 03/29/2019 0327   BILITOT 1.1 03/21/2019 0326   BILITOT 0.4 08/05/2018 1431   PROT 5.3 (L) 03/29/2019 0327   PROT 6.2 (L) 03/21/2019 0326   PROT 6.9 08/05/2018 1431   ALBUMIN 2.4 (L) 03/29/2019 0327   ALBUMIN 2.5 (L) 03/25/2019 0419   ALBUMIN 4.5 08/05/2018 1431    Studies/Results: No results found.    Armandina Gemma 04/02/2019  Patient ID: Andrew Solomon, male   DOB: 1950/08/13, 68 y.o.   MRN: [id number]

## 2019-04-02 NOTE — Progress Notes (Signed)
[facility]  PROGRESS NOTE  Andrew Solomon [id number] DOB: Jan 23, 1951 DOA: 03/20/2019 PCP: Harlan Stains, MD  Brief History    Andrew Solomon is a 68 y.o. year old male with medical history significant for HTN, HLD, emphysema, CAD who presented on 03/20/2019 with worsening abdominal pain after eating, diarrhea, nausea and was found to have sigmoid diverticulitis with small perforation.  Repeat CT abdomen imaging showed perforation as well as 3.6 cm abscess and SBO on 10/8.  Patient was treated with full resuscitation, Zosyn which was changed to [place] on 10/10.  Repeat CT abd imaging on 10/13 showed enlarging abscess with either localized ileus or mechanical obstructionand white count elevated to 17.  Patient underwent laparoscopic drainage of abscess  A & P     Acute sigmoid diverticulitis with abscess and associated ileus, stable.  Continues to have BMs, passing flatus, abdomen seems less distended, white count coming down slowly and remains afebrile.  Out of bed ambulating well, encourage incentive spirometry, plan for repeat CT abdomen on 10/19 per surgery.   Continue IV pain control, IV Invanz, hopeful can continue to manage without further surgical intervention, diet is liberalized but not eating much, agree with Ensure supplementation.  Surgery monitoring drain output   AKI, resolved.  Peak creatinine of 1.2, now back at baseline.  Likely prerenal in setting of diminished oral intake related to above.   Leukocytosis, continues to downtrend.    Remains afebrile, closely monitor   CAD status post stent.  Remains asymptomatic, continue aspirin   Hyperlipidemia, stable.  Continue Crestor   History of emphysema, no wheezing on exam, normal O2 requirements, albuterol nebs as needed.   HTN, stable continue atenolol      DVT prophylaxis: Lovenox Code Status: Full Family Communication: No family at bedside Disposition Plan: Monitor abdominal exam and drain output,  ability to tolerate oral intake, close monitoring of white count, repeat CT abdomen on 10/19     [facility] Direct contact: see [email or web address] (further directions at bottom of note if needed) 7PM-7AM contact night coverage as at bottom of note 04/02/2019, 1:13 PM  LOS: 13 days   Consultants  . Surgery  Procedures  . Laparoscopic abscess drainage, 10/14  Antibiotics  . Zosyn, Invanz  Interval History/Subjective  Still having BMs, passing flatus, reports improvement in abdominal distention Got out of bed to chair and did a few laps around the unit this morning  Objective   Vitals:  Vitals:   04/01/19 2129 04/02/19 0533  BP: 116/64 121/62  Pulse: 74 75  Resp: 20 18  Temp: 98.3 F (36.8 C) 99 F (37.2 C)  SpO2: 100% 93%    Exam:  Awake Alert, Oriented X 3, No new F.N deficits, Normal affect Normal respiratory effort on 2 L, no wheezing heard, Abdomen distended but soft, slightly tender with palpation, bowel sounds heard, drain in place with minimal clear fluid output, no rebound tenderness or guarding,     I have personally reviewed the following:   Data Reviewed: Basic Metabolic Panel: Recent Labs  Lab 03/28/19 0359 03/29/19 0327 03/29/19 1730 03/30/19 0303  NA 138 137  --  137  K 3.3* 3.1*  --  4.0  CL 104 101  --  104  CO2 24 23  --  20*  GLUCOSE 97 87  --  103*  BUN 11 8  --  11  CREATININE 0.90 0.90  --  0.82  CALCIUM 8.0* 8.0*  --  8.1*  MG  --   --  1.9  --    Liver Function Tests: Recent Labs  Lab 03/29/19 0327  AST 27  ALT 30  ALKPHOS 54  BILITOT 0.9  PROT 5.3*  ALBUMIN 2.4*   No results for input(s): LIPASE, AMYLASE in the last 168 hours. No results for input(s): AMMONIA in the last 168 hours. CBC: Recent Labs  Lab 03/27/19 0342 03/28/19 0359 03/29/19 0327 03/30/19 0303 03/31/19 0318 04/01/19 0337 04/02/19 0328  WBC 11.9* 17.0* 16.2* 19.1* 17.6* 13.9* 10.6*  NEUTROABS 8.4* 13.9* 13.0*  --   --   --   --   HGB 11.3*  11.3* 11.0* 11.1* 11.2* 10.7* 10.0*  HCT 35.5* 34.8* 34.3* 34.8* 35.2* 34.6* 32.0*  MCV 94.7 92.8 93.5 92.8 93.9 94.5 96.1  PLT 334 342 336 404* 409* 384 408*   Cardiac Enzymes: No results for input(s): CKTOTAL, CKMB, CKMBINDEX, TROPONINI in the last 168 hours. BNP (last 3 results) No results for input(s): BNP in the last 8760 hours.  ProBNP (last 3 results) No results for input(s): PROBNP in the last 8760 hours.  CBG: No results for input(s): GLUCAP in the last 168 hours.  Recent Results (from the past 240 hour(s))  C difficile quick scan w PCR reflex     Status: None   Collection Time: 03/27/19  2:07 PM   Specimen: STOOL  Result Value Ref Range Status   C Diff antigen NEGATIVE NEGATIVE Final   C Diff toxin NEGATIVE NEGATIVE Final   C Diff interpretation No C. difficile detected.  Final    Comment: NEGATIVE Performed at [facility], [place] [address] [place], [place] [address]   Surgical PCR screen     Status: None   Collection Time: 03/28/19  6:23 PM   Specimen: Nasal Mucosa; Nasal Swab  Result Value Ref Range Status   MRSA, PCR NEGATIVE NEGATIVE Final   Staphylococcus aureus NEGATIVE NEGATIVE Final    Comment: (NOTE) The Xpert SA Assay (FDA approved for NASAL specimens in patients 5 years of age and older), is one component of a comprehensive surveillance program. It is not intended to diagnose infection nor to guide or monitor treatment. Performed at [facility], [place] [address], [place], [place] [address]      Studies: No results found.  Scheduled Meds: . aspirin EC  81 mg Oral Daily  . atenolol  12.5 mg Oral Daily  . atenolol  25 mg Oral Daily  . enoxaparin (LOVENOX) injection  40 mg Subcutaneous Q24H  . feeding supplement (ENSURE ENLIVE)  237 mL Oral BID BM  . pantoprazole  40 mg Oral Daily  . sodium chloride flush  3 mL Intravenous Q12H  . traZODone  50 mg Oral QHS   Continuous Infusions: . sodium chloride 40 mL/hr  at 04/01/19 1800  . ertapenem 1,000 mg (04/02/19 0711)    Principal Problem:   Diverticulitis of large intestine with perforation Active Problems:   Coronary artery disease PTCA and stents to proximal LAD in 1998, last stress test December 2018-   Dyslipidemia   Essential hypertension   Ileus ([place])   Leukocytosis      Suleika Donavan D [place] Mcadory  [facility]

## 2019-04-02 NOTE — Progress Notes (Signed)
Pt stable with no needs at time of bedside rounding. No signs of distress to note. Rn will continue to monitor.

## 2019-04-03 ENCOUNTER — Inpatient Hospital Stay (HOSPITAL_COMMUNITY): Payer: Medicare Other

## 2019-04-03 DIAGNOSIS — K5720 Diverticulitis of large intestine with perforation and abscess without bleeding: Secondary | ICD-10-CM | POA: Diagnosis not present

## 2019-04-03 LAB — CBC
HCT: 31.5 % — ABNORMAL LOW (ref 39.0–52.0)
Hemoglobin: 9.7 g/dL — ABNORMAL LOW (ref 13.0–17.0)
MCH: 29.7 pg (ref 26.0–34.0)
MCHC: 30.8 g/dL (ref 30.0–36.0)
MCV: 96.3 fL (ref 80.0–100.0)
Platelets: 448 10*3/uL — ABNORMAL HIGH (ref 150–400)
RBC: 3.27 MIL/uL — ABNORMAL LOW (ref 4.22–5.81)
RDW: 13.5 % (ref 11.5–15.5)
WBC: 8.9 10*3/uL (ref 4.0–10.5)
nRBC: 0 % (ref 0.0–0.2)

## 2019-04-03 MED ORDER — IOHEXOL 300 MG/ML  SOLN
100.0000 mL | Freq: Once | INTRAMUSCULAR | Status: AC | PRN
  Administered 2019-04-03: 100 mL via INTRAVENOUS

## 2019-04-03 MED ORDER — SODIUM CHLORIDE (PF) 0.9 % IJ SOLN
INTRAMUSCULAR | Status: AC
  Filled 2019-04-03: qty 50

## 2019-04-03 MED ORDER — AMOXICILLIN-POT CLAVULANATE 875-125 MG PO TABS: 1.0000 | ORAL_TABLET | Freq: Two times a day (BID) | ORAL | Status: DC

## 2019-04-03 MED ORDER — BACLOFEN 10 MG PO TABS
10.0000 mg | ORAL_TABLET | Freq: Three times a day (TID) | ORAL | Status: DC | PRN
  Administered 2019-04-03 – 2019-04-05 (×6): 10 mg via ORAL
  Filled 2019-04-03 (×7): qty 1

## 2019-04-03 MED ORDER — IOHEXOL 300 MG/ML  SOLN
30.0000 mL | Freq: Once | INTRAMUSCULAR | Status: AC | PRN
  Administered 2019-04-03: 30 mL via ORAL

## 2019-04-03 NOTE — Progress Notes (Signed)
[facility]  PROGRESS NOTE  PERCELL SCHLAUD [id number] DOB: February 09, 1951 DOA: 03/20/2019 PCP: Harlan Stains, MD  Brief History    Andrew Solomon is a 68 y.o. year old male with medical history significant for HTN, HLD, emphysema, CAD who presented on 03/20/2019 with worsening abdominal pain after eating, diarrhea, nausea and was found to have sigmoid diverticulitis with small perforation.  Repeat CT abdomen imaging showed perforation as well as 3.6 cm abscess and SBO on 10/8.  Patient was treated with full resuscitation, Zosyn which was changed to [place] on 10/10.  Repeat CT abd imaging on 10/13 showed enlarging abscess with either localized ileus or mechanical obstructionand white count elevated to 17.  Patient underwent laparoscopic drainage of abscess  A & P     Acute sigmoid diverticulitis with abscess and associated ileus, improving.  Repeat CT shows improvement in abscess, eating well, passing flatus, abdomen still distended but not as tender. Encourage IS given atelectasis present. Ambulating well and getting OOB to chair. White count back to normal. ON ertapenem since t10/9(10 days), previously on Zosyn 4 days ( 10/5-10/8). Remains afebrile with normalized white count would likely benefit from deescalating or discontinuing antibiotics, will discuss with surgery given CT abd findings.   AKI, resolved.  Peak creatinine of 1.2, now back at baseline.  Likely prerenal in setting of diminished oral intake related to above.   Leukocytosis, continues to downtrend.    Remains afebrile, closely monitor   CAD status post stent.  Remains asymptomatic, continue aspirin   Hyperlipidemia, stable.  Continue Crestor   History of emphysema, no wheezing on exam, normal O2 requirements, albuterol nebs as needed.   HTN, stable continue atenolol      DVT prophylaxis: Lovenox Code Status: Full Family Communication: No family at bedside Disposition Plan: Monitor abdominal exam and  drain output, on IV ertapenem may be able to de-escalate or even discontinue, will discuss with surgery    [facility] Direct contact: see [email or web address] (further directions at bottom of note if needed) 7PM-7AM contact night coverage as at bottom of note 04/03/2019, 5:38 PM  LOS: 14 days   Consultants  . Surgery  Procedures  . Laparoscopic abscess drainage, 10/14  Antibiotics  . Zosyn, Invanz  Interval History/Subjective  Still walking Having flatus and normal BMs Eating well Occasional abd pain  Objective   Vitals:  Vitals:   04/03/19 0600 04/03/19 1443  BP: 121/66 128/64  Pulse: 65 75  Resp: 16   Temp: 99.3 F (37.4 C) 98.6 F (37 C)  SpO2: 98% 100%    Exam:  Awake Alert, Oriented X 3, No new F.N deficits, Normal affect Normal respiratory effort on 2 L, no wheezing heard, Abdomen distended but soft, non-tender, bowel sounds heard, drain in place with minimal clear fluid output, no rebound tenderness or guarding,     I have personally reviewed the following:   Data Reviewed: Basic Metabolic Panel: Recent Labs  Lab 03/28/19 0359 03/29/19 0327 03/29/19 1730 03/30/19 0303  NA 138 137  --  137  K 3.3* 3.1*  --  4.0  CL 104 101  --  104  CO2 24 23  --  20*  GLUCOSE 97 87  --  103*  BUN 11 8  --  11  CREATININE 0.90 0.90  --  0.82  CALCIUM 8.0* 8.0*  --  8.1*  MG  --   --  1.9  --    Liver Function Tests: Recent Labs  Lab 03/29/19  0327  AST 27  ALT 30  ALKPHOS 54  BILITOT 0.9  PROT 5.3*  ALBUMIN 2.4*   No results for input(s): LIPASE, AMYLASE in the last 168 hours. No results for input(s): AMMONIA in the last 168 hours. CBC: Recent Labs  Lab 03/28/19 0359 03/29/19 0327 03/30/19 0303 03/31/19 0318 04/01/19 0337 04/02/19 0328 04/03/19 0247  WBC 17.0* 16.2* 19.1* 17.6* 13.9* 10.6* 8.9  NEUTROABS 13.9* 13.0*  --   --   --   --   --   HGB 11.3* 11.0* 11.1* 11.2* 10.7* 10.0* 9.7*  HCT 34.8* 34.3* 34.8* 35.2* 34.6* 32.0* 31.5*  MCV  92.8 93.5 92.8 93.9 94.5 96.1 96.3  PLT 342 336 404* 409* 384 408* 448*   Cardiac Enzymes: No results for input(s): CKTOTAL, CKMB, CKMBINDEX, TROPONINI in the last 168 hours. BNP (last 3 results) No results for input(s): BNP in the last 8760 hours.  ProBNP (last 3 results) No results for input(s): PROBNP in the last 8760 hours.  CBG: No results for input(s): GLUCAP in the last 168 hours.  Recent Results (from the past 240 hour(s))  C difficile quick scan w PCR reflex     Status: None   Collection Time: 03/27/19  2:07 PM   Specimen: STOOL  Result Value Ref Range Status   C Diff antigen NEGATIVE NEGATIVE Final   C Diff toxin NEGATIVE NEGATIVE Final   C Diff interpretation No C. difficile detected.  Final    Comment: NEGATIVE Performed at [facility], [place] [address], [place], [place] [address]   Surgical PCR screen     Status: None   Collection Time: 03/28/19  6:23 PM   Specimen: Nasal Mucosa; Nasal Swab  Result Value Ref Range Status   MRSA, PCR NEGATIVE NEGATIVE Final   Staphylococcus aureus NEGATIVE NEGATIVE Final    Comment: (NOTE) The Xpert SA Assay (FDA approved for NASAL specimens in patients 70 years of age and older), is one component of a comprehensive surveillance program. It is not intended to diagnose infection nor to guide or monitor treatment. Performed at [facility], [place] [address] [place], [place] [address]      Studies: Ct Abdomen Pelvis W Contrast  Result Date: 04/03/2019 CLINICAL DATA:  Diverticulitis with abscess. EXAM: CT ABDOMEN AND PELVIS WITH CONTRAST TECHNIQUE: Multidetector CT imaging of the abdomen and pelvis was performed using the standard protocol following bolus administration of intravenous contrast. CONTRAST:  153mL OMNIPAQUE IOHEXOL 300 MG/ML  SOLN COMPARISON:  March 28, 2019. FINDINGS: Lower chest: Mild bilateral pleural effusions are noted with adjacent atelectasis. Hepatobiliary: Stable small  adenomyosis or pharyngian cap is seen involving the gallbladder. No cholelithiasis or wall thickening is noted. No biliary dilatation is noted. Stable small hepatic cysts are noted. Pancreas: Unremarkable. No pancreatic ductal dilatation or surrounding inflammatory changes. Spleen: Normal in size without focal abnormality. Adrenals/Urinary Tract: Adrenal glands are unremarkable. Kidneys are normal, without renal calculi, focal lesion, or hydronephrosis. Bladder is unremarkable. Stomach/Bowel: The stomach appears normal. No significant small bowel dilatation is noted currently. Findings are consistent with sigmoid diverticulitis which may be improving. There is interval placement of surgical drain entering left anterior abdominal wall which extends into the pelvis, with distal tip in the right lower quadrant. The abscess noted on prior exam is significantly decompressed. Most superior portion measures 29 x 16 mm which is significantly smaller compared to prior exam. Most inferior portion measures 26 x 25 mm, which is also decreased compared to  prior exam. Vascular/Lymphatic: Aortic atherosclerosis. No enlarged abdominal or pelvic lymph nodes. Reproductive: Prostate is unremarkable. Other: No definite hernia is noted. No significant ascites or free fluid is noted. Musculoskeletal: No acute or significant osseous findings. IMPRESSION: Findings consistent with sigmoid diverticulitis which may be improved compared to prior exam. There is been interval placement of surgical drain in the left pelvis, with significant decompression of complex abscess noted on prior CT scan. Mild bilateral pleural effusions are noted with adjacent subsegmental atelectasis. Aortic Atherosclerosis (ICD10-I70.0). Electronically Signed   By: Marijo Conception M.D.   On: 04/03/2019 14:44    Scheduled Meds: . [START ON 04/04/2019] amoxicillin-clavulanate  1 tablet Oral Q12H  . aspirin EC  81 mg Oral Daily  . atenolol  12.5 mg Oral Daily  .  atenolol  25 mg Oral Daily  . enoxaparin (LOVENOX) injection  40 mg Subcutaneous Q24H  . feeding supplement (ENSURE ENLIVE)  237 mL Oral BID BM  . pantoprazole  40 mg Oral Daily  . sodium chloride (PF)      . sodium chloride flush  3 mL Intravenous Q12H  . traZODone  50 mg Oral QHS   Continuous Infusions: . sodium chloride 65 mL/hr at 04/03/19 1400    Principal Problem:   Diverticulitis of large intestine with perforation Active Problems:   Coronary artery disease PTCA and stents to proximal LAD in 1998, last stress test December 2018-   Dyslipidemia   Essential hypertension   Ileus ([place])   Leukocytosis      Dorisann Schwanke D Merlon Alcorta  [facility]

## 2019-04-03 NOTE — Progress Notes (Signed)
   04/03/19 1200  Clinical Encounter Type  Visited With Patient  Visit Type Initial;Psychological support;Spiritual support  Referral From Nurse  Consult/Referral To Chaplain  Spiritual Encounters  Spiritual Needs Emotional;Other (Comment) (Clothing )  Stress Factors  Patient Stress Factors Health changes;Lack of knowledge;Other (Comment) (Clothing )   I provided Mr. Andrew Solomon lounge pants per his request.    Chaplain Shanon Ace M.Div., [facility]

## 2019-04-03 NOTE — Progress Notes (Signed)
5 Days Post-Op    CC: Abdominal pain  Subjective: Patient still having abdominal pain he took hydrocodone and Dilaudid yesterday.  Pain appears to be more on the right flank.  There is nothing coming out of the JP drain left at his washout.  He is tolerating soft diet.  Objective: Vital signs in last 24 hours: Temp:  [98.1 F (36.7 C)-99.3 F (37.4 C)] 99.3 F (37.4 C) (10/19 0600) Pulse Rate:  [65-70] 65 (10/19 0600) Resp:  [16-18] 16 (10/19 0600) BP: (102-121)/(54-66) 121/66 (10/19 0600) SpO2:  [91 %-98 %] 98 % (10/19 0600) Last BM Date: 04/02/19 1400 PO 1300 IV Urine 1550 Drain - 0 recorded Stool x 1 Afebrile, VSS WBC 8.9 H/H stable  Intake/Output from previous day: 10/18 0701 - 10/19 0700 In: 2756.5 [P.O.:1400; I.V.:1256.5; IV Piggyback:100] Out: 1550 [Urine:1550] Intake/Output this shift: No intake/output data recorded.  General appearance: alert, cooperative and no distress Resp: clear to auscultation bilaterally GI: Soft, he appears distended to me.  There is nothing in the JP drain.  Bowel sounds are positive.  He did have a bowel movement yesterday.  Lab Results:  Recent Labs    04/02/19 0328 04/03/19 0247  WBC 10.6* 8.9  HGB 10.0* 9.7*  HCT 32.0* 31.5*  PLT 408* 448*    BMET No results for input(s): NA, K, CL, CO2, GLUCOSE, BUN, CREATININE, CALCIUM in the last 72 hours. PT/INR No results for input(s): LABPROT, INR in the last 72 hours.  Recent Labs  Lab 03/29/19 0327  AST 27  ALT 30  ALKPHOS 54  BILITOT 0.9  PROT 5.3*  ALBUMIN 2.4*     Lipase     Component Value Date/Time   LIPASE 29 03/20/2019 1006     Medications: . aspirin EC  81 mg Oral Daily  . atenolol  12.5 mg Oral Daily  . atenolol  25 mg Oral Daily  . enoxaparin (LOVENOX) injection  40 mg Subcutaneous Q24H  . feeding supplement (ENSURE ENLIVE)  237 mL Oral BID BM  . pantoprazole  40 mg Oral Daily  . sodium chloride flush  3 mL Intravenous Q12H  . traZODone  50 mg Oral  QHS   . sodium chloride 40 mL/hr at 04/03/19 0250  . ertapenem 1,000 mg (04/02/19 0711)    Assessment/Plan AKI - resolved Hx of emphysema Hypertension CAD with prior stent Hyperlipidemia   Diverticulitis with abscess Laparoscopic drainage, intra-abdominal abscess with drain placement, 03/29/2019, Dr. Autumn Messing  FEN: IV fluids/soft diet ID: Zosyn 10/5-10/8; Invanz 10/9 >> day 11 DVT:  Lovenox Follow up:  Dr. Marlou Starks  Plan: Repeat CT of the abdomen pelvis with contrast this a.m.  Continue Invanz/IV fluids.  Recheck labs in a.m.  LOS: 14 days    Emmons Toth 04/03/2019 [phone]

## 2019-04-04 LAB — COMPREHENSIVE METABOLIC PANEL
ALT: 21 U/L (ref 0–44)
AST: 25 U/L (ref 15–41)
Albumin: 1.9 g/dL — ABNORMAL LOW (ref 3.5–5.0)
Alkaline Phosphatase: 45 U/L (ref 38–126)
Anion gap: 7 (ref 5–15)
BUN: 8 mg/dL (ref 8–23)
CO2: 27 mmol/L (ref 22–32)
Calcium: 7.7 mg/dL — ABNORMAL LOW (ref 8.9–10.3)
Chloride: 105 mmol/L (ref 98–111)
Creatinine, Ser: 1.03 mg/dL (ref 0.61–1.24)
GFR calc Af Amer: >60 mL/min (ref >60)
GFR calc non Af Amer: >60 mL/min (ref >60)
Glucose, Bld: 97 mg/dL (ref 70–99)
Potassium: 3.6 mmol/L (ref 3.5–5.1)
Sodium: 139 mmol/L (ref 135–145)
Total Bilirubin: 0.4 mg/dL (ref 0.3–1.2)
Total Protein: 5 g/dL — ABNORMAL LOW (ref 6.5–8.1)

## 2019-04-04 LAB — CBC
HCT: 30.9 % — ABNORMAL LOW (ref 39.0–52.0)
Hemoglobin: 9.3 g/dL — ABNORMAL LOW (ref 13.0–17.0)
MCH: 29 pg (ref 26.0–34.0)
MCHC: 30.1 g/dL (ref 30.0–36.0)
MCV: 96.3 fL (ref 80.0–100.0)
Platelets: 422 10*3/uL — ABNORMAL HIGH (ref 150–400)
RBC: 3.21 MIL/uL — ABNORMAL LOW (ref 4.22–5.81)
RDW: 13.5 % (ref 11.5–15.5)
WBC: 10.9 10*3/uL — ABNORMAL HIGH (ref 4.0–10.5)
nRBC: 0 % (ref 0.0–0.2)

## 2019-04-04 MED ORDER — SACCHAROMYCES BOULARDII 250 MG PO CAPS
250.0000 mg | ORAL_CAPSULE | Freq: Two times a day (BID) | ORAL | Status: DC
  Administered 2019-04-04 – 2019-04-06 (×5): 250 mg via ORAL
  Filled 2019-04-04 (×5): qty 1

## 2019-04-04 MED ORDER — POTASSIUM CHLORIDE CRYS ER 20 MEQ PO TBCR
20.0000 meq | EXTENDED_RELEASE_TABLET | Freq: Every day | ORAL | Status: DC
  Administered 2019-04-04 – 2019-04-06 (×3): 20 meq via ORAL
  Filled 2019-04-04 (×3): qty 1

## 2019-04-04 MED ORDER — SODIUM CHLORIDE 0.9 % IV SOLN
1.0000 g | INTRAVENOUS | Status: DC
  Administered 2019-04-04: 1000 mg via INTRAVENOUS
  Filled 2019-04-04 (×2): qty 1

## 2019-04-04 NOTE — Progress Notes (Signed)
6 Days Post-Op    CC: Abdominal pain  Subjective: Patient still fairly distended.  P.o. intake does not make him feel worse.  He does say he does not have much of an appetite.  He is fairly sedentary he is not getting much help with ambulation.  The pain may be more from the bed than the abdomen, he complains of pain more in his posterior back/chest area.  Nonetheless he remains extremely distended and tight.  There is minimal drainage from the JP and is clear/serous.  His port sites all look good as does his JP site.  Objective: Vital signs in last 24 hours: Temp:  [98.2 F (36.8 C)-98.6 F (37 C)] 98.5 F (36.9 C) (10/20 0635) Pulse Rate:  [62-75] 62 (10/20 0635) Resp:  [18] 18 (10/20 0635) BP: (103-128)/(61-65) 103/61 (10/20 0635) SpO2:  [94 %-100 %] 96 % (10/20 0635) Last BM Date: 04/03/19 1060 PO 1000 IV 850 urine No drainage from JP Stool x 2 Afebrile, VSS WBC up to 10.9 Creatinine 1.03 CT10/19:  Mild bilateral effusions/atelectasis.  The stomach was normal.  There is no significant small bowel dilatation.  Findings are consistent with sigmoid diverticulitis there is interval placement of surgical drain in the left anterior abdominal wall extending to the pelvis with the distal tip in the right lower quadrant.  Abscess noted on the prior exam is significantly decompressed.  Most superior portion measures 29 x 16 mm which is smaller than prior exam.  Inferior portion measures 26 x 25 mm also decreased from prior exam.  Intake/Output from previous day: 10/19 0701 - 10/20 0700 In: 2019.1 [P.O.:1060; I.V.:959.1] Out: 850 [Urine:850] Intake/Output this shift: No intake/output data recorded.  General appearance: alert, cooperative and no distress Resp: clear to auscultation bilaterally and Down slightly in the bases. GI: He is distended but not really tender.  He does not have discomfort with palpation.  His JP drain is serous.  He is having soft bowel movements.  Port sites and  JP site all look good.  Lab Results:  Recent Labs    04/03/19 0247 04/04/19 0345  WBC 8.9 10.9*  HGB 9.7* 9.3*  HCT 31.5* 30.9*  PLT 448* 422*    BMET Recent Labs    04/04/19 0345  NA 139  K 3.6  CL 105  CO2 27  GLUCOSE 97  BUN 8  CREATININE 1.03  CALCIUM 7.7*   PT/INR No results for input(s): LABPROT, INR in the last 72 hours.  Recent Labs  Lab 03/29/19 0327 04/04/19 0345  AST 27 25  ALT 30 21  ALKPHOS 54 45  BILITOT 0.9 0.4  PROT 5.3* 5.0*  ALBUMIN 2.4* 1.9*     Lipase     Component Value Date/Time   LIPASE 29 03/20/2019 1006     Medications: . amoxicillin-clavulanate  1 tablet Oral Q12H  . aspirin EC  81 mg Oral Daily  . atenolol  12.5 mg Oral Daily  . atenolol  25 mg Oral Daily  . enoxaparin (LOVENOX) injection  40 mg Subcutaneous Q24H  . feeding supplement (ENSURE ENLIVE)  237 mL Oral BID BM  . pantoprazole  40 mg Oral Daily  . sodium chloride flush  3 mL Intravenous Q12H  . traZODone  50 mg Oral QHS    AssAKI - resolved Hx of emphysema Hypertension CAD with prior stent Hyperlipidemia   Diverticulitis with abscess Laparoscopic drainage, intra-abdominal abscess with drain placement, 03/29/2019, Dr. Autumn Messing  FEN: IV fluids/soft diet ID: Andrew Solomon  10/5-10/8; Invanz 10/9- day 12 DVT:  Lovenox Follow up:  Dr. Marlou Starks Pain: Hydrocodone 10 mg x 3; Dilaudid 1 mg x 2; trazodone 50 mg HS   Plan: His ongoing abdominal distention and discomfort/lack of appetite are worrisome.  His WBC is up slightly today.  We will leave him on a soft diet but keep him on Invanz and we will recheck his labs again tomorrow. Add probiotics, recheck labs in AM.        LOS: 15 days    Andrew Solomon 04/04/2019 [phone]

## 2019-04-04 NOTE — Progress Notes (Signed)
[facility]  PROGRESS NOTE  TIRAN KOCHEL [id number] DOB: Jul 30, 1950 DOA: 03/20/2019 PCP: Harlan Stains, MD  Brief History    Andrew Solomon is a 68 y.o. year old male with medical history significant for HTN, HLD, emphysema, CAD who presented on 03/20/2019 with worsening abdominal pain after eating, diarrhea, nausea and was found to have sigmoid diverticulitis with small perforation.  Repeat CT abdomen imaging showed perforation as well as 3.6 cm abscess and SBO on 10/8.  Patient was treated with full resuscitation, Zosyn which was changed to [place] on 10/10.  Repeat CT abd imaging on 10/13 showed enlarging abscess with either localized ileus or mechanical obstructionand white count elevated to 17.  Patient underwent laparoscopic drainage of abscess  A & P     Acute sigmoid diverticulitis with abscess and associated ileus, improving.  Repeat CT shows improvement in abscess, eating well, passing flatus, abdomen still distended but not as tender. Encourage IS given atelectasis present. Ambulating well and getting OOB to chair. White count slightly up from yesterday. ON ertapenem since 10/9(10 days), previously on Zosyn 4 days ( 10/5-10/8). Remains afebrile, continues to remain stable/clinically improved surgery plans to switch to oral antibiotics, appreciate surgery assistance.   AKI, resolved.  Peak creatinine of 1.2, now back at baseline.  Likely prerenal in setting of diminished oral intake related to above.   Leukocytosis, went up slightly from 8.9 to 10.9 ( previous peak of 19.1) .    Remains afebrile, blood cultures x2 no growth, repeat CBC in a.m.   CAD status post stent.  Remains asymptomatic, continue aspirin   Hyperlipidemia, stable.  Continue Crestor   History of emphysema, no wheezing on exam, normal O2 requirements, albuterol nebs as needed, incentive spiromete   HTN, stable continue atenolol      DVT prophylaxis: Lovenox Code Status: Full Family  Communication: No family at bedside Disposition Plan: Monitor abdominal exam and drain output, on IV ertapenem may be able to de-escalate or even discontinue, repeat CBC in a.m.    [facility] Direct contact: see [email or web address] (further directions at bottom of note if needed) 7PM-7AM contact night coverage as at bottom of note 04/04/2019, 3:51 PM  LOS: 15 days   Consultants  . Surgery  Procedures  . Laparoscopic abscess drainage, 10/14  Antibiotics  . Bebe Liter  Interval History/Subjective  Walking in hallway with tach Still having BMs, flatus Still tolerating diet Reports improvement in abdominal pain No fevers overnight  Objective   Vitals:  Vitals:   04/04/19 0635 04/04/19 1333  BP: 103/61 126/72  Pulse: 62 73  Resp: 18 17  Temp: 98.5 F (36.9 C) 98.3 F (36.8 C)  SpO2: 96% 95%    Exam: Walking in hall, alert, no distress Normal respiratory effort on 2 L, Abdomen distended, no rebound tenderness, no tenderness, bowel sounds are, drain in place with minimal clear fluid output   I have personally reviewed the following:   Data Reviewed: Basic Metabolic Panel: Recent Labs  Lab 03/29/19 0327 03/29/19 1730 03/30/19 0303 04/04/19 0345  NA 137  --  137 139  K 3.1*  --  4.0 3.6  CL 101  --  104 105  CO2 23  --  20* 27  GLUCOSE 87  --  103* 97  BUN 8  --  11 8  CREATININE 0.90  --  0.82 1.03  CALCIUM 8.0*  --  8.1* 7.7*  MG  --  1.9  --   --  Liver Function Tests: Recent Labs  Lab 03/29/19 0327 04/04/19 0345  AST 27 25  ALT 30 21  ALKPHOS 54 45  BILITOT 0.9 0.4  PROT 5.3* 5.0*  ALBUMIN 2.4* 1.9*   No results for input(s): LIPASE, AMYLASE in the last 168 hours. No results for input(s): AMMONIA in the last 168 hours. CBC: Recent Labs  Lab 03/29/19 0327  03/31/19 0318 04/01/19 0337 04/02/19 0328 04/03/19 0247 04/04/19 0345  WBC 16.2*   < > 17.6* 13.9* 10.6* 8.9 10.9*  NEUTROABS 13.0*  --   --   --   --   --   --   HGB 11.0*   <  > 11.2* 10.7* 10.0* 9.7* 9.3*  HCT 34.3*   < > 35.2* 34.6* 32.0* 31.5* 30.9*  MCV 93.5   < > 93.9 94.5 96.1 96.3 96.3  PLT 336   < > 409* 384 408* 448* 422*   < > = values in this interval not displayed.   Cardiac Enzymes: No results for input(s): CKTOTAL, CKMB, CKMBINDEX, TROPONINI in the last 168 hours. BNP (last 3 results) No results for input(s): BNP in the last 8760 hours.  ProBNP (last 3 results) No results for input(s): PROBNP in the last 8760 hours.  CBG: No results for input(s): GLUCAP in the last 168 hours.  Recent Results (from the past 240 hour(s))  C difficile quick scan w PCR reflex     Status: None   Collection Time: 03/27/19  2:07 PM   Specimen: STOOL  Result Value Ref Range Status   C Diff antigen NEGATIVE NEGATIVE Final   C Diff toxin NEGATIVE NEGATIVE Final   C Diff interpretation No C. difficile detected.  Final    Comment: NEGATIVE Performed at [facility], [place] [address], [place], [place] [address]   Surgical PCR screen     Status: None   Collection Time: 03/28/19  6:23 PM   Specimen: Nasal Mucosa; Nasal Swab  Result Value Ref Range Status   MRSA, PCR NEGATIVE NEGATIVE Final   Staphylococcus aureus NEGATIVE NEGATIVE Final    Comment: (NOTE) The Xpert SA Assay (FDA approved for NASAL specimens in patients 31 years of age and older), is one component of a comprehensive surveillance program. It is not intended to diagnose infection nor to guide or monitor treatment. Performed at [facility], [place] [address], [place], [place] [address]      Studies: Ct Abdomen Pelvis W Contrast  Result Date: 04/03/2019 CLINICAL DATA:  Diverticulitis with abscess. EXAM: CT ABDOMEN AND PELVIS WITH CONTRAST TECHNIQUE: Multidetector CT imaging of the abdomen and pelvis was performed using the standard protocol following bolus administration of intravenous contrast. CONTRAST:  125mL OMNIPAQUE IOHEXOL 300 MG/ML  SOLN COMPARISON:   March 28, 2019. FINDINGS: Lower chest: Mild bilateral pleural effusions are noted with adjacent atelectasis. Hepatobiliary: Stable small adenomyosis or pharyngian cap is seen involving the gallbladder. No cholelithiasis or wall thickening is noted. No biliary dilatation is noted. Stable small hepatic cysts are noted. Pancreas: Unremarkable. No pancreatic ductal dilatation or surrounding inflammatory changes. Spleen: Normal in size without focal abnormality. Adrenals/Urinary Tract: Adrenal glands are unremarkable. Kidneys are normal, without renal calculi, focal lesion, or hydronephrosis. Bladder is unremarkable. Stomach/Bowel: The stomach appears normal. No significant small bowel dilatation is noted currently. Findings are consistent with sigmoid diverticulitis which may be improving. There is interval placement of surgical drain entering left anterior abdominal wall which extends into the pelvis, with distal tip in  the right lower quadrant. The abscess noted on prior exam is significantly decompressed. Most superior portion measures 29 x 16 mm which is significantly smaller compared to prior exam. Most inferior portion measures 26 x 25 mm, which is also decreased compared to prior exam. Vascular/Lymphatic: Aortic atherosclerosis. No enlarged abdominal or pelvic lymph nodes. Reproductive: Prostate is unremarkable. Other: No definite hernia is noted. No significant ascites or free fluid is noted. Musculoskeletal: No acute or significant osseous findings. IMPRESSION: Findings consistent with sigmoid diverticulitis which may be improved compared to prior exam. There is been interval placement of surgical drain in the left pelvis, with significant decompression of complex abscess noted on prior CT scan. Mild bilateral pleural effusions are noted with adjacent subsegmental atelectasis. Aortic Atherosclerosis (ICD10-I70.0). Electronically Signed   By: Marijo Conception M.D.   On: 04/03/2019 14:44    Scheduled Meds: .  aspirin EC  81 mg Oral Daily  . atenolol  12.5 mg Oral Daily  . atenolol  25 mg Oral Daily  . enoxaparin (LOVENOX) injection  40 mg Subcutaneous Q24H  . feeding supplement (ENSURE ENLIVE)  237 mL Oral BID BM  . pantoprazole  40 mg Oral Daily  . potassium chloride  20 mEq Oral QPC breakfast  . saccharomyces boulardii  250 mg Oral BID  . sodium chloride flush  3 mL Intravenous Q12H  . traZODone  50 mg Oral QHS   Continuous Infusions: . ertapenem 1,000 mg (04/04/19 1037)    Principal Problem:   Diverticulitis of large intestine with perforation Active Problems:   Coronary artery disease PTCA and stents to proximal LAD in 1998, last stress test December 2018-   Dyslipidemia   Essential hypertension   Ileus ([place])   Leukocytosis      Shayla D Nettey  [facility]

## 2019-04-05 LAB — CBC
HCT: 34 % — ABNORMAL LOW (ref 39.0–52.0)
Hemoglobin: 10.5 g/dL — ABNORMAL LOW (ref 13.0–17.0)
MCH: 29.4 pg (ref 26.0–34.0)
MCHC: 30.9 g/dL (ref 30.0–36.0)
MCV: 95.2 fL (ref 80.0–100.0)
Platelets: 405 10*3/uL — ABNORMAL HIGH (ref 150–400)
RBC: 3.57 MIL/uL — ABNORMAL LOW (ref 4.22–5.81)
RDW: 13.6 % (ref 11.5–15.5)
WBC: 7.3 10*3/uL (ref 4.0–10.5)
nRBC: 0 % (ref 0.0–0.2)

## 2019-04-05 LAB — BASIC METABOLIC PANEL
Anion gap: 8 (ref 5–15)
BUN: 9 mg/dL (ref 8–23)
CO2: 26 mmol/L (ref 22–32)
Calcium: 8.3 mg/dL — ABNORMAL LOW (ref 8.9–10.3)
Chloride: 105 mmol/L (ref 98–111)
Creatinine, Ser: 0.89 mg/dL (ref 0.61–1.24)
GFR calc Af Amer: >60 mL/min (ref >60)
GFR calc non Af Amer: >60 mL/min (ref >60)
Glucose, Bld: 94 mg/dL (ref 70–99)
Potassium: 3.8 mmol/L (ref 3.5–5.1)
Sodium: 139 mmol/L (ref 135–145)

## 2019-04-05 MED ORDER — HYDROMORPHONE HCL 1 MG/ML IJ SOLN: 0.5000 mg | INTRAMUSCULAR | Status: DC | PRN

## 2019-04-05 MED ORDER — AMOXICILLIN-POT CLAVULANATE 875-125 MG PO TABS
1.0000 | ORAL_TABLET | Freq: Two times a day (BID) | ORAL | Status: DC
  Administered 2019-04-05 – 2019-04-06 (×3): 1 via ORAL
  Filled 2019-04-05 (×3): qty 1

## 2019-04-05 NOTE — Progress Notes (Addendum)
7 Days Post-Op    CC: Abdominal pain  Subjective: He says he is feeling better this a.m.  He is sitting up in the chair so he still looks and feels a little distended.  Drainage is clear from the [place].  But it is a little dark.  He says he was up ambulating yesterday and his abdomen gets sore with ambulation, but improves with rest.  Objective: Vital signs in last 24 hours: Temp:  [98.3 F (36.8 C)-98.6 F (37 C)] 98.5 F (36.9 C) (10/21 0506) Pulse Rate:  [62-73] 66 (10/21 0506) Resp:  [17-20] 20 (10/21 0506) BP: (96-126)/(55-72) 111/65 (10/21 0506) SpO2:  [94 %-95 %] 94 % (10/21 0506) Last BM Date: 04/03/19 840 p.o. 562 IV Voided x3 No drainage recorded Stool x2 Afebrile vital signs are stable BMP is stable.  Creatinine 0.89, WBC 7.3 H/H 10.5/34  Intake/Output from previous day: 10/20 0701 - 10/21 0700 In: 1402.3 [P.O.:840; I.V.:462.3; IV Piggyback:100] Out: 0  Intake/Output this shift: No intake/output data recorded.  General appearance: alert, cooperative and no distress Resp: clear to auscultation bilaterally GI: Still distended, sitting up in the chair, not complaining of any significant pain currently.  He does note that he has some discomfort up ambulating, but after resting he gets better and he can ambulate again.  Lab Results:  Recent Labs    04/04/19 0345 04/05/19 0325  WBC 10.9* 7.3  HGB 9.3* 10.5*  HCT 30.9* 34.0*  PLT 422* 405*    BMET Recent Labs    04/04/19 0345 04/05/19 0325  NA 139 139  K 3.6 3.8  CL 105 105  CO2 27 26  GLUCOSE 97 94  BUN 8 9  CREATININE 1.03 0.89  CALCIUM 7.7* 8.3*   PT/INR No results for input(s): LABPROT, INR in the last 72 hours.  Recent Labs  Lab 04/04/19 0345  AST 25  ALT 21  ALKPHOS 45  BILITOT 0.4  PROT 5.0*  ALBUMIN 1.9*     Lipase     Component Value Date/Time   LIPASE 29 03/20/2019 1006     Medications: . aspirin EC  81 mg Oral Daily  . atenolol  12.5 mg Oral Daily  . atenolol  25 mg  Oral Daily  . enoxaparin (LOVENOX) injection  40 mg Subcutaneous Q24H  . feeding supplement (ENSURE ENLIVE)  237 mL Oral BID BM  . pantoprazole  40 mg Oral Daily  . potassium chloride  20 mEq Oral QPC breakfast  . saccharomyces boulardii  250 mg Oral BID  . sodium chloride flush  3 mL Intravenous Q12H  . traZODone  50 mg Oral QHS    Assessment/Plan  AKI - resolved Hx of emphysema Hypertension CAD with prior stent Hyperlipidemia   Diverticulitis with abscess Laparoscopic drainage, intra-abdominal abscess with drain placement, 03/29/2019, Dr. Autumn Messing POD#7   FEN: IV fluids/soft diet ID: Zosyn 10/5-10/8; Invanz 10/9-20; Augmentin 10/21>> day 1 DVT: Lovenox Follow up: Dr. Marlou Starks Pain: Hydrocodone 10 mg x 4;  No Dilaudid yesterday; trazodone 50 mg HS   Plan: We will switch him over to Augmentin today.  Recheck labs again in the a.m.  If he continues to do well will plan on discharge tomorrow.   Nutrition consult for teaching low fiber and high fiber diets.    LOS: 16 days    Andrew Solomon 04/05/2019 Please see Amion

## 2019-04-05 NOTE — Progress Notes (Signed)
PROGRESS NOTE    [name]  [id number] DOB: 02/05/1951 DOA: 03/20/2019 PCP: Harlan Stains, MD    Brief Narrative:   Andrew Solomon is a 68 y.o. year old male with medical history significant for HTN, HLD, emphysema, CAD who presented on 03/20/2019 with worsening abdominal pain after eating, diarrhea, nausea and was found to have sigmoid diverticulitis with small perforation.  Repeat CT abdomen imaging showed perforation as well as 3.6 cm abscess and SBO on 10/8. Patient was treated with full resuscitation, Zosyn which was changed to [place] on 10/10.  Repeat CT abd imaging on 10/13 showed enlarging abscess with either localized ileus or mechanical obstructionand white count elevated to 17.  Patient underwent laparoscopic drainage of abscess    Assessment & Plan:   Principal Problem:   Diverticulitis of large intestine with perforation Active Problems:   Coronary artery disease PTCA and stents to proximal LAD in 1998, last stress test December 2018-   Dyslipidemia   Essential hypertension   Ileus ([place])   Leukocytosis  Acute sigmoid diverticulitis with abscess and associated ileus, improving.   Repeat CT shows improvement in abscess, eating well, passing flatus, abdomen still distended but not as tender. Encourage IS given atelectasis present. Ambulating well and getting OOB to chair. White count back to normal.  --Ertapenem since 10/9(10 days), previously on Zosyn 4 days ( 10/5-10/8).  --JP drain with no recorded output past 24 hours --Remains afebrile with normalized white count  --Transition ertapenem to Augmentin today per general surgery --Repeat CBC in the a.m. --Likely discharge home tomorrow  AKI, resolved.   Peak creatinine of 1.2, now back at baseline.  Likely prerenal in setting of diminished oral intake related to above.  Leukocytosis, resolved  WBC 7.3 today. Remains afebrile, closely monitor  CAD status post stent.  Remains asymptomatic, continue aspirin   Hyperlipidemia, stable.  Continue Crestor  History of emphysema No wheezing on exam, normal O2 requirements, albuterol nebs as needed.  HTN, stable: continue atenolol   DVT prophylaxis: Lovenox Code Status: Full code Family Communication: No family present at bedside Disposition Plan: Continue inpatient, IV antibiotics to oral today, likely discharge home tomorrow if he remains symptom-free and no changes in his white blood cell count.   Consultants:   General surgery  Procedures:   Laparoscopic drainage of intra-abdominal abscess with JP drain placement on 03/29/2019, Dr. Marlou Starks  Antimicrobials:   Zosyn 03/20/2019-03/23/2019  Colbert Ewing 03/24/2019-04/04/2019  Augmentin 10/21>>   Subjective: Patient seen and examined at bedside, resting comfortably in bedside chair.  Awaiting arrival of his breakfast.  Mild abdominal discomfort with ambulation, otherwise no other complaints this morning.  Denies headache, no fever/chills/night sweats, no nausea/vomiting/diarrhea, no chest pain, palpitations, no shortness of breath, no weakness, no fatigue, no paresthesias.  No acute events overnight per nursing staff.  Objective: Vitals:   04/04/19 1333 04/04/19 2128 04/04/19 2240 04/05/19 0506  BP: 126/72 (!) 96/55 126/70 111/65  Pulse: 73 66 62 66  Resp: 17 20  20   Temp: 98.3 F (36.8 C) 98.6 F (37 C)  98.5 F (36.9 C)  TempSrc: Oral Oral  Oral  SpO2: 95% 95%  94%  Weight:      Height:        Intake/Output Summary (Last 24 hours) at 04/05/2019 1255 Last data filed at 04/05/2019 1003 Gross per 24 hour  Intake 1286.73 ml  Output 400 ml  Net 886.73 ml   Filed Weights   03/20/19 1700  Weight: 79.1 kg  Examination:  General exam: Appears calm and comfortable  Respiratory system: Clear to auscultation. Respiratory effort normal. Cardiovascular system: S1 & S2 heard, RRR. No JVD, murmurs, rubs, gallops or clicks. No pedal edema. Gastrointestinal system: Abdomen is  nondistended, soft and nontender. No organomegaly or masses felt. Normal bowel sounds heard.  JP drain noted. Central nervous system: Alert and oriented. No focal neurological deficits. Extremities: Symmetric 5 x 5 power. Skin: No rashes, lesions or ulcers Psychiatry: Judgement and insight appear normal. Mood & affect appropriate.     Data Reviewed: I have personally reviewed following labs and imaging studies  CBC: Recent Labs  Lab 04/01/19 0337 04/02/19 0328 04/03/19 0247 04/04/19 0345 04/05/19 0325  WBC 13.9* 10.6* 8.9 10.9* 7.3  HGB 10.7* 10.0* 9.7* 9.3* 10.5*  HCT 34.6* 32.0* 31.5* 30.9* 34.0*  MCV 94.5 96.1 96.3 96.3 95.2  PLT 384 408* 448* 422* [id number]*   Basic Metabolic Panel: Recent Labs  Lab 03/29/19 1730 03/30/19 0303 04/04/19 0345 04/05/19 0325  NA  --  137 139 139  K  --  4.0 3.6 3.8  CL  --  104 105 105  CO2  --  20* 27 26  GLUCOSE  --  103* 97 94  BUN  --  11 8 9   CREATININE  --  0.82 1.03 0.89  CALCIUM  --  8.1* 7.7* 8.3*  MG 1.9  --   --   --    GFR: Estimated Creatinine Clearance: 72.2 mL/min (by C-G formula based on SCr of 0.89 mg/dL). Liver Function Tests: Recent Labs  Lab 04/04/19 0345  AST 25  ALT 21  ALKPHOS 45  BILITOT 0.4  PROT 5.0*  ALBUMIN 1.9*   No results for input(s): LIPASE, AMYLASE in the last 168 hours. No results for input(s): AMMONIA in the last 168 hours. Coagulation Profile: No results for input(s): INR, PROTIME in the last 168 hours. Cardiac Enzymes: No results for input(s): CKTOTAL, CKMB, CKMBINDEX, TROPONINI in the last 168 hours. BNP (last 3 results) No results for input(s): PROBNP in the last 8760 hours. HbA1C: No results for input(s): HGBA1C in the last 72 hours. CBG: No results for input(s): GLUCAP in the last 168 hours. Lipid Profile: No results for input(s): CHOL, HDL, LDLCALC, TRIG, CHOLHDL, LDLDIRECT in the last 72 hours. Thyroid Function Tests: No results for input(s): TSH, T4TOTAL, FREET4, T3FREE,  THYROIDAB in the last 72 hours. Anemia Panel: No results for input(s): VITAMINB12, FOLATE, FERRITIN, TIBC, IRON, RETICCTPCT in the last 72 hours. Sepsis Labs: No results for input(s): PROCALCITON, LATICACIDVEN in the last 168 hours.  Recent Results (from the past 240 hour(s))  C difficile quick scan w PCR reflex     Status: None   Collection Time: 03/27/19  2:07 PM   Specimen: STOOL  Result Value Ref Range Status   C Diff antigen NEGATIVE NEGATIVE Final   C Diff toxin NEGATIVE NEGATIVE Final   C Diff interpretation No C. difficile detected.  Final    Comment: NEGATIVE Performed at [facility], [place] [address], [place], [place] [address]   Surgical PCR screen     Status: None   Collection Time: 03/28/19  6:23 PM   Specimen: Nasal Mucosa; Nasal Swab  Result Value Ref Range Status   MRSA, PCR NEGATIVE NEGATIVE Final   Staphylococcus aureus NEGATIVE NEGATIVE Final    Comment: (NOTE) The Xpert SA Assay (FDA approved for NASAL specimens in patients 11 years of age and older), is one component of  a comprehensive surveillance program. It is not intended to diagnose infection nor to guide or monitor treatment. Performed at [facility], [place] [address] [place],  [address]          Radiology Studies: Ct Abdomen Pelvis W Contrast  Result Date: 04/03/2019 CLINICAL DATA:  Diverticulitis with abscess. EXAM: CT ABDOMEN AND PELVIS WITH CONTRAST TECHNIQUE: Multidetector CT imaging of the abdomen and pelvis was performed using the standard protocol following bolus administration of intravenous contrast. CONTRAST:  117mL OMNIPAQUE IOHEXOL 300 MG/ML  SOLN COMPARISON:  March 28, 2019. FINDINGS: Lower chest: Mild bilateral pleural effusions are noted with adjacent atelectasis. Hepatobiliary: Stable small adenomyosis or pharyngian cap is seen involving the gallbladder. No cholelithiasis or wall thickening is noted. No biliary dilatation is noted.  Stable small hepatic cysts are noted. Pancreas: Unremarkable. No pancreatic ductal dilatation or surrounding inflammatory changes. Spleen: Normal in size without focal abnormality. Adrenals/Urinary Tract: Adrenal glands are unremarkable. Kidneys are normal, without renal calculi, focal lesion, or hydronephrosis. Bladder is unremarkable. Stomach/Bowel: The stomach appears normal. No significant small bowel dilatation is noted currently. Findings are consistent with sigmoid diverticulitis which may be improving. There is interval placement of surgical drain entering left anterior abdominal wall which extends into the pelvis, with distal tip in the right lower quadrant. The abscess noted on prior exam is significantly decompressed. Most superior portion measures 29 x 16 mm which is significantly smaller compared to prior exam. Most inferior portion measures 26 x 25 mm, which is also decreased compared to prior exam. Vascular/Lymphatic: Aortic atherosclerosis. No enlarged abdominal or pelvic lymph nodes. Reproductive: Prostate is unremarkable. Other: No definite hernia is noted. No significant ascites or free fluid is noted. Musculoskeletal: No acute or significant osseous findings. IMPRESSION: Findings consistent with sigmoid diverticulitis which may be improved compared to prior exam. There is been interval placement of surgical drain in the left pelvis, with significant decompression of complex abscess noted on prior CT scan. Mild bilateral pleural effusions are noted with adjacent subsegmental atelectasis. Aortic Atherosclerosis (ICD10-I70.0). Electronically Signed   By: Marijo Conception M.D.   On: 04/03/2019 14:44        Scheduled Meds: . amoxicillin-clavulanate  1 tablet Oral Q12H  . aspirin EC  81 mg Oral Daily  . atenolol  12.5 mg Oral Daily  . atenolol  25 mg Oral Daily  . enoxaparin (LOVENOX) injection  40 mg Subcutaneous Q24H  . feeding supplement (ENSURE ENLIVE)  237 mL Oral BID BM  .  pantoprazole  40 mg Oral Daily  . potassium chloride  20 mEq Oral QPC breakfast  . saccharomyces boulardii  250 mg Oral BID  . sodium chloride flush  3 mL Intravenous Q12H  . traZODone  50 mg Oral QHS   Continuous Infusions:   LOS: 16 days    Time spent: 34 minutes spent on chart review, discussion with nursing staff, consultants, updating family and interview/physical exam; more than 50% of that time was spent in counseling and/or coordination of care.    Eric J [place] DO [facility] Pager [phone]  If 7PM-7AM, please contact night-coverage [email or web address] Password [email or web address] 04/05/2019, 12:55 PM

## 2019-04-06 DIAGNOSIS — E785 Hyperlipidemia, unspecified: Secondary | ICD-10-CM | POA: Diagnosis not present

## 2019-04-06 DIAGNOSIS — I2510 Atherosclerotic heart disease of native coronary artery without angina pectoris: Secondary | ICD-10-CM

## 2019-04-06 DIAGNOSIS — K567 Ileus, unspecified: Secondary | ICD-10-CM

## 2019-04-06 DIAGNOSIS — D72829 Elevated white blood cell count, unspecified: Secondary | ICD-10-CM

## 2019-04-06 DIAGNOSIS — I10 Essential (primary) hypertension: Secondary | ICD-10-CM

## 2019-04-06 DIAGNOSIS — K5720 Diverticulitis of large intestine with perforation and abscess without bleeding: Principal | ICD-10-CM

## 2019-04-06 LAB — BASIC METABOLIC PANEL
Anion gap: 8 (ref 5–15)
BUN: 9 mg/dL (ref 8–23)
CO2: 28 mmol/L (ref 22–32)
Calcium: 8.4 mg/dL — ABNORMAL LOW (ref 8.9–10.3)
Chloride: 104 mmol/L (ref 98–111)
Creatinine, Ser: 0.9 mg/dL (ref 0.61–1.24)
GFR calc Af Amer: >60 mL/min (ref >60)
GFR calc non Af Amer: >60 mL/min (ref >60)
Glucose, Bld: 96 mg/dL (ref 70–99)
Potassium: 4 mmol/L (ref 3.5–5.1)
Sodium: 140 mmol/L (ref 135–145)

## 2019-04-06 LAB — CBC
HCT: 34.6 % — ABNORMAL LOW (ref 39.0–52.0)
Hemoglobin: 10.7 g/dL — ABNORMAL LOW (ref 13.0–17.0)
MCH: 29.5 pg (ref 26.0–34.0)
MCHC: 30.9 g/dL (ref 30.0–36.0)
MCV: 95.3 fL (ref 80.0–100.0)
Platelets: 383 10*3/uL (ref 150–400)
RBC: 3.63 MIL/uL — ABNORMAL LOW (ref 4.22–5.81)
RDW: 13.6 % (ref 11.5–15.5)
WBC: 7.8 10*3/uL (ref 4.0–10.5)
nRBC: 0 % (ref 0.0–0.2)

## 2019-04-06 MED ORDER — SIMETHICONE 80 MG PO CHEW: 80.0000 mg | CHEWABLE_TABLET | Freq: Four times a day (QID) | ORAL | 0 refills | Status: DC | PRN

## 2019-04-06 MED ORDER — AMOXICILLIN-POT CLAVULANATE 875-125 MG PO TABS: 1.0000 | ORAL_TABLET | Freq: Two times a day (BID) | ORAL | 0 refills | Status: DC

## 2019-04-06 MED ORDER — HYDROCODONE-ACETAMINOPHEN 5-325 MG PO TABS: 1.0000 | ORAL_TABLET | ORAL | 0 refills | Status: DC | PRN

## 2019-04-06 MED ORDER — ENSURE ENLIVE PO LIQD: 237.0000 mL | Freq: Two times a day (BID) | ORAL | 12 refills | Status: DC

## 2019-04-06 MED ORDER — SACCHAROMYCES BOULARDII 250 MG PO CAPS: ORAL_CAPSULE | ORAL | Status: DC

## 2019-04-06 NOTE — Discharge Summary (Signed)
Physician Discharge Summary  Andrew Solomon [id number] DOB: [birth year] DOA: 03/20/2019  PCP: Harlan Stains, MD  Admit date: 03/20/2019 Discharge date: 04/06/2019  Recommendations for Outpatient Follow-up:  1. No driving until cleared by PCP. 2. Follow up with PCP in 7-10 days. 3. Follow up with general surgery as directed.  Follow-up Information    [name], MD Follow up on 04/20/2019.   Specialty: General Surgery Why: Your appointment is at 1:30 PM.  Be at the office 30 minutes early for check in.  Bring photo ID and insurance information.   Contact information: [place] [address] [phone]        Harlan Stains, MD Follow up.   Specialty: Family Medicine Why: Call and let her know about your hospitalization and let her follow up for medical issues.  You will need a colonoscopy in 6-8 weeks, and she can help you arrange this. Contact information: [place] [address] [phone]            Discharge Diagnoses: Principal diagnosis is #1 1. Acute sigmoid diverticulitis with abscess and associated ileus 2. AKI 3. Leukocytosis 4. CAD, s/p stent 5. Hyperlipidemia 6. History of emphysema 7. Essential Hypertension  Discharge Condition: Fair Disposition: Home  Diet recommendation: Heart healthy  Filed Weights   03/20/19 1700  Weight: 79.1 kg    History of present illness:  68 year old man PMH CAD status post PCI stenting over 20 years ago, hypertension, hyperlipidemia presented with crampy abdominal pain.  [place]  He ate an apple that day and developed abdominal pain with an hour.  Symptoms waxed and waned over the week with severe bouts of abdominal pain although he was able to continue to eat and drink.  Bowel movements were half solid, half liquid.  Nausea but no vomiting.  Last evening the pain in the abdomen became intense, located in the lower  abdomen, doubling him over.  No specific aggravating or alleviating factors.  No fever at home.  Chart review:  01/2019 cardiology outpatient visit: Coronary artery disease status post remote stent placement.  Stable.  ED Course: Presented with abdominal pain for approximately 10 days.  CT scan concerning for perforated diverticulitis.  Started on Zosyn, surgery consulted.  Surgery noted pan colitis versus diverticulitis with inflammatory changes, plan for n.p.o. status and IV antibiotics.  Hospital Course: Loid Zolnowski [facility] a 68 y.o.year old malewith medical history significant for HTN, HLD, emphysema, CAD who presented on 10/5/2020with worsening abdominal pain after eating, diarrhea, nausea and was found to have sigmoid diverticulitis with small perforation.  Repeat CT abdomen imaging showed perforation as well as 3.6 cm abscess and SBO on 10/8. Patient was treated with full resuscitation, Zosyn which was changed to [place] on 10/10. Repeat CT abd imaging on 10/13 showed enlarging abscess with either localized ileus or mechanical obstructionand white count elevated to 17. Patient underwent laparoscopic drainage of abscess   Today's assessment: S: The patient is resting comfortably. No new complaints. O: Vitals:  Vitals:   04/05/19 2130 04/06/19 0504  BP: 140/67 (!) 107/59  Pulse: 64 61  Resp: 18 20  Temp: 98.9 F (37.2 C) 98.9 F (37.2 C)  SpO2: 99% 95%    Exam:  Constitutional:  The patient is awake, alert, and oriented x 3. No acute distress. Respiratory:  No increased work of breathing. No wheezes, rales, or rhonchi No tactile fremitus Cardiovascular:  Regular rate and rhythm No murmurs, ectopy, or gallups. No lateral PMI. No thrills. Abdomen:  Abdomen is soft, non-tender, non-distended No hernias, masses, or organomegaly Normoactive bowel sounds.  Musculoskeletal:  No cyanosis, clubbing, or edema Skin:  No rashes, lesions, ulcers palpation of skin: no  induration or nodules Neurologic:  CN 2-12 intact Sensation all 4 extremities intact Psychiatric:  Mental status Mood, affect appropriate Orientation to person, place, time  judgment and insight appear intact  Discharge Instructions  Discharge Instructions    Activity as tolerated - No restrictions   Complete by: As directed    Call MD for:  persistant nausea and vomiting   Complete by: As directed    Call MD for:  redness, tenderness, or signs of infection (pain, swelling, redness, odor or green/yellow discharge around incision site)   Complete by: As directed    Call MD for:  severe uncontrolled pain   Complete by: As directed    Diet - low sodium heart healthy   Complete by: As directed    Discharge instructions   Complete by: As directed    No driving until cleared by PCP. Follow up with PCP in 7-10 days. Follow up with general surgery as directed.   Increase activity slowly   Complete by: As directed      Allergies as of 04/06/2019      Reactions   Niacin And Related    flushing   Oxycodone    headache   Sulfur Hives, Swelling      Medication List    STOP taking these medications   Fish Oil 1000 MG Caps   vitamin B-12 1000 MCG tablet Commonly known as: CYANOCOBALAMIN     TAKE these medications   acetaminophen 325 MG tablet Commonly known as: TYLENOL Take 650 mg by mouth every 6 (six) hours as needed for mild pain or headache.   amoxicillin-clavulanate 875-125 MG tablet Commonly known as: AUGMENTIN Take 1 tablet by mouth every 12 (twelve) hours.   aspirin EC 81 MG tablet Take 81 mg by mouth daily.   atenolol 25 MG tablet Commonly known as: TENORMIN TAKE 1 TABLET(25 MG) BY MOUTH DAILY What changed: See the new instructions.   buPROPion 150 MG 12 hr tablet Commonly known as: WELLBUTRIN SR Take 150 mg by mouth daily.   ezetimibe 10 MG tablet Commonly known as: ZETIA TAKE 1 TABLET BY MOUTH EVERY DAY   feeding supplement (ENSURE ENLIVE)  Liqd Take 237 mLs by mouth 2 (two) times daily between meals.   HYDROcodone-acetaminophen 5-325 MG tablet Commonly known as: NORCO/VICODIN Take 1-2 tablets by mouth every 4 (four) hours as needed for moderate pain.   omeprazole 40 MG capsule Commonly known as: PRILOSEC Take 40 mg by mouth daily.   rosuvastatin 40 MG tablet Commonly known as: CRESTOR TAKE 1 TABLET BY MOUTH EVERY DAY ON MONDAY, WEDNESDAY AND FRIDAY AND TAKE 1/2 A TABLET ON TUES, THURSDAY, SATURDAY AND SUNDAY What changed: See the new instructions.   saccharomyces boulardii 250 MG capsule Commonly known as: FLORASTOR This is the probiotic.  You can buy this over-the-counter at any drugstore without a prescription.  They come in 28-day packages.  You can pick anyone, and use for the next 28 days.  Just follow the package instructions for that particular brand and type.   SELENIMIN PO Take 1 capsule by mouth daily.   simethicone 80 MG chewable tablet Commonly known as: MYLICON Chew 1 tablet (80 mg total) by mouth 4 (four)  times daily as needed for flatulence.   sodium chloride 0.65 % Soln nasal spray Commonly known as: OCEAN Place 1 spray into both nostrils as needed for congestion.   tiZANidine 4 MG tablet Commonly known as: ZANAFLEX Take 4 mg by mouth every 6 (six) hours as needed for muscle spasms.   traZODone 50 MG tablet Commonly known as: DESYREL Take 50 mg by mouth at bedtime.   vitamin B-6 500 MG tablet Take 500 mg by mouth daily.   Vitamin D3 25 MCG (1000 UT) Caps Take 1 capsule by mouth daily.      Allergies  Allergen Reactions   Niacin And Related     flushing   Oxycodone     headache   Sulfur Hives and Swelling    The results of significant diagnostics from this hospitalization (including imaging, microbiology, ancillary and laboratory) are listed below for reference.    Significant Diagnostic Studies: Ct Abdomen Pelvis W Contrast  Result Date: 04/03/2019 CLINICAL DATA:   Diverticulitis with abscess. EXAM: CT ABDOMEN AND PELVIS WITH CONTRAST TECHNIQUE: Multidetector CT imaging of the abdomen and pelvis was performed using the standard protocol following bolus administration of intravenous contrast. CONTRAST:  181mL OMNIPAQUE IOHEXOL 300 MG/ML  SOLN COMPARISON:  March 28, 2019. FINDINGS: Lower chest: Mild bilateral pleural effusions are noted with adjacent atelectasis. Hepatobiliary: Stable small adenomyosis or pharyngian cap is seen involving the gallbladder. No cholelithiasis or wall thickening is noted. No biliary dilatation is noted. Stable small hepatic cysts are noted. Pancreas: Unremarkable. No pancreatic ductal dilatation or surrounding inflammatory changes. Spleen: Normal in size without focal abnormality. Adrenals/Urinary Tract: Adrenal glands are unremarkable. Kidneys are normal, without renal calculi, focal lesion, or hydronephrosis. Bladder is unremarkable. Stomach/Bowel: The stomach appears normal. No significant small bowel dilatation is noted currently. Findings are consistent with sigmoid diverticulitis which may be improving. There is interval placement of surgical drain entering left anterior abdominal wall which extends into the pelvis, with distal tip in the right lower quadrant. The abscess noted on prior exam is significantly decompressed. Most superior portion measures 29 x 16 mm which is significantly smaller compared to prior exam. Most inferior portion measures 26 x 25 mm, which is also decreased compared to prior exam. Vascular/Lymphatic: Aortic atherosclerosis. No enlarged abdominal or pelvic lymph nodes. Reproductive: Prostate is unremarkable. Other: No definite hernia is noted. No significant ascites or free fluid is noted. Musculoskeletal: No acute or significant osseous findings. IMPRESSION: Findings consistent with sigmoid diverticulitis which may be improved compared to prior exam. There is been interval placement of surgical drain in the left  pelvis, with significant decompression of complex abscess noted on prior CT scan. Mild bilateral pleural effusions are noted with adjacent subsegmental atelectasis. Aortic Atherosclerosis (ICD10-I70.0). Electronically Signed   By: Marijo Conception M.D.   On: 04/03/2019 14:44   Ct Abdomen Pelvis W Contrast  Result Date: 03/28/2019 CLINICAL DATA:  Perforated diverticulitis follow-up. EXAM: CT ABDOMEN AND PELVIS WITH CONTRAST TECHNIQUE: Multidetector CT imaging of the abdomen and pelvis was performed using the standard protocol following bolus administration of intravenous contrast. CONTRAST:  124mL OMNIPAQUE IOHEXOL 300 MG/ML  SOLN COMPARISON:  03/23/2019 FINDINGS: Lower chest: Small bilateral pleural effusions, mildly increased in size, with associated atelectasis in both lower lobes, left greater than right. Lingular subsegmental atelectasis. Hepatobiliary: Stable small hypodense hepatic lesions, likely cysts. Gallbladder unremarkable. Small Phrygian cap versus focal adenomyosis of the fundus of the gallbladder. Pancreas: Unremarkable Spleen: Unremarkable Adrenals/Urinary Tract: Unremarkable Stomach/Bowel: Sigmoid diverticulosis with  active diverticulitis. Serpentine abscess above the sigmoid colon has at least 2 branching some with a similar configuration to the prior exam. Subjectively the complex branching abscess is mildly larger. A component of the abscess on image 65/2 measures 3.8 by 3.4 cm, previously 3.1 by 2.6 cm. There are air-fluid levels within the abscess. Separate from the abscess, I do not see any true free air currently. There is mesenteric and omental edema in the region of the abscesses. There are dilated loops of small bowel extending down into the pelvis into the region of the abscesses. The terminal ileum, which is just distal to the abscesses, is of normal caliber. The appendix is partially seen and slightly indistinct but I am skeptical of acute appendicitis. Vascular/Lymphatic:  Aortoiliac atherosclerotic vascular disease. Scattered small porta hepatis and retroperitoneal lymph nodes are probably reactive. Reproductive: Unremarkable Other: Small amount of free pelvic fluid. Small amount of fluid along the paracolic gutters. Musculoskeletal: Acetabular spurring. Moderate degenerative chondral thinning in both hips. Right foraminal impingement at L5-S1 due to spurring as shown on image 58/6. IMPRESSION: 1. There is a long tubular branching abscess in the pelvis, starting just above the area of active diverticulitis in the sigmoid colon, mildly enlarged compared to the prior exam. This has internal air-fluid levels but I do not see free intraperitoneal gas separate from the gas contained in the abscess. Surrounding mesenteric and omental stranding noted. 2. Dilated loops of small bowel extend down to the level of the abscess where loops of ileum are in close association with the abscess. The terminal ileum is distal to the abscess and is of normal caliber. Presumably the abscesses are causing either localized ileus or less likely mechanical obstruction. 3. Small bilateral pleural effusions are mildly increased in size. 4. Stable hypodense hepatic lesions, likely cysts. 5. Focal adenomyosis or a small Phrygian cap of the fundus of the gallbladder. 6.  Aortoiliac atherosclerotic vascular disease. 7. Small amount of ascites. 8. Degenerative hip arthropathy. 9. Right foraminal impingement at L5-S1. Electronically Signed   By: Van Clines M.D.   On: 03/28/2019 14:11   Ct Abdomen Pelvis W Contrast  Result Date: 03/23/2019 CLINICAL DATA:  Diverticulitis, hypotension fever EXAM: CT ABDOMEN AND PELVIS WITH CONTRAST TECHNIQUE: Multidetector CT imaging of the abdomen and pelvis was performed using the standard protocol following bolus administration of intravenous contrast. CONTRAST:  116mL OMNIPAQUE IOHEXOL 300 MG/ML SOLN, 29mL OMNIPAQUE IOHEXOL 300 MG/ML SOLN COMPARISON:  March 20, 2019  FINDINGS: Lower chest: There is mild cardiomegaly. There is trace bilateral pleural effusion. Adjacent compressive atelectasis seen at the posterior left lung base. Hepatobiliary: Again noted are multiple tiny hypodense lesions throughout the liver parenchyma the largest in the anterior left liver lobe measuring 1.3 cm.The main portal vein is patent. No evidence of calcified gallstones, gallbladder wall thickening or biliary dilatation. Pancreas: Unremarkable. No pancreatic ductal dilatation or surrounding inflammatory changes. Spleen: Normal in size without focal abnormality. Adrenals/Urinary Tract: Both adrenal glands appear normal. The kidneys and collecting system appear normal without evidence of urinary tract calculus or hydronephrosis. Bladder is unremarkable. Stomach/Bowel: The stomach is contrast filled and dilated. There is a mildly dilated esophagus with contrast seen in the lower chest. There is dilated air and fluid-filled loops of proximal small bowel measuring up to 3.6 cm to the level of the mid ileum in the right mid abdomen where there is a focal area of narrowing. There is diffuse bowel wall thickening surrounding mesenteric fat stranding changes in this region. The  distal ileal loops are decompressed. Again noted is diffuse wall thickening of the sigmoid colon with small foci of free air seen superior to the sigmoid colon, series 2, image 68, which appears worsened since the prior exam. There are now several multilocular peripherally enhancing fluid collection seen within the right lower abdomen extending superiorly adjacent to the sigmoid colon and ileal loops the largest of which measures approximately 3.5 cm on series 2, image 65. Vascular/Lymphatic: There are no enlarged mesenteric, retroperitoneal, or pelvic lymph nodes. Scattered aortic atherosclerotic calcifications are seen without aneurysmal dilatation. Reproductive: The prostate is unremarkable. Other: No evidence of abdominal wall mass  or hernia. Small amount of perihepatic ascites is seen. Musculoskeletal: No acute or significant osseous findings. IMPRESSION: 1. New proximal partial small bowel obstruction to the level of the mid to distal ileum in the right lower quadrant where there is a focal area of narrowing with ileal wall thickening and inflammatory changes. 2. Interval worsening of the sigmoid colonic diverticulitis with surrounding small foci of pneumoperitoneum. There is also new multilocular fluid collections in the right lower quadrant, likely consistent with intra-abdominal pericolonic abscesses, the largest measuring 3.5 cm. 3. Small amount of perihepatic ascites 4. Trace bilateral pleural effusions. These results were called by telephone at the time of interpretation on 03/23/19 at 8:00 pm to provider Donia Guiles, who verbally acknowledged these results. Electronically Signed   By: Prudencio Pair M.D.   On: 03/23/2019 20:00   Ct Abdomen Pelvis W Contrast  Result Date: 03/20/2019 CLINICAL DATA:  Acute right-sided abdominal pain. EXAM: CT ABDOMEN AND PELVIS WITH CONTRAST TECHNIQUE: Multidetector CT imaging of the abdomen and pelvis was performed using the standard protocol following bolus administration of intravenous contrast. CONTRAST:  157mL OMNIPAQUE IOHEXOL 300 MG/ML  SOLN COMPARISON:  None. FINDINGS: Lower chest: No acute abnormality. Hepatobiliary: No gallstones or biliary dilatation is noted. Multiple hepatic low densities are noted most consistent with cysts. Pancreas: Unremarkable. No pancreatic ductal dilatation or surrounding inflammatory changes. Spleen: Normal in size without focal abnormality. Adrenals/Urinary Tract: Adrenal glands are unremarkable. Kidneys are normal, without renal calculi, focal lesion, or hydronephrosis. Bladder is unremarkable. Stomach/Bowel: The stomach appears normal. The appendix appears normal. There appears to be substantial wall thickening involving the proximal sigmoid colon  concerning for diverticulitis. There is seen some inflammatory changes and free fluid in the surrounding peritoneal fat superior to this portion of colon with some free air present suggesting small perforation. There is some dilatation and wall thickening of small bowel loops in this area suggesting secondary inflammation of the small bowel. Vascular/Lymphatic: Aortic atherosclerosis. No enlarged abdominal or pelvic lymph nodes. Reproductive: Mild prostatic enlargement is noted. Other: No hiatal hernia is noted. Musculoskeletal: No acute or significant osseous findings. IMPRESSION: Findings most consistent with proximal sigmoid diverticulitis, with inflammatory changes and free fluid in the surrounding peritoneal fat superior to this portion of the colon, with some amount of free air present suggesting small perforation. Inflammatory changes of adjacent small bowel loops are noted as well. Aortic Atherosclerosis (ICD10-I70.0). Electronically Signed   By: Marijo Conception M.D.   On: 03/20/2019 13:01   Dg Chest Port 1 View  Result Date: 03/21/2019 CLINICAL DATA:  Wheezing and unable to pre. EXAM: PORTABLE CHEST 1 VIEW COMPARISON:  December 03, 2015 FINDINGS: The mediastinal contour and cardiac silhouette are normal. Minimal atelectasis or scar of the left lung base are noted. Stable small calcified granuloma is identified in the right mid lung unchanged. There is no focal  pneumonia or pulmonary edema. Bony structures are stable. IMPRESSION: No focal pneumonia. Minimal atelectasis or scar of the left lung base. Electronically Signed   By: Abelardo Diesel M.D.   On: 03/21/2019 19:32    Microbiology: Recent Results (from the past 240 hour(s))  Surgical PCR screen     Status: None   Collection Time: 03/28/19  6:23 PM   Specimen: Nasal Mucosa; Nasal Swab  Result Value Ref Range Status   MRSA, PCR NEGATIVE NEGATIVE Final   Staphylococcus aureus NEGATIVE NEGATIVE Final    Comment: (NOTE) The Xpert SA Assay (FDA  approved for NASAL specimens in patients 78 years of age and older), is one component of a comprehensive surveillance program. It is not intended to diagnose infection nor to guide or monitor treatment. Performed at [facility], [place] [address] [place] [address]      Labs: Basic Metabolic Panel: Recent Labs  Lab 04/04/19 0345 04/05/19 0325 04/06/19 0317  NA 139 139 140  K 3.6 3.8 4.0  CL 105 105 104  CO2 27 26 28   GLUCOSE 97 94 96  BUN 8 9 9   CREATININE 1.03 0.89 0.90  CALCIUM 7.7* 8.3* 8.4*   Liver Function Tests: Recent Labs  Lab 04/04/19 0345  AST 25  ALT 21  ALKPHOS 45  BILITOT 0.4  PROT 5.0*  ALBUMIN 1.9*   No results for input(s): LIPASE, AMYLASE in the last 168 hours. No results for input(s): AMMONIA in the last 168 hours. CBC: Recent Labs  Lab 04/02/19 0328 04/03/19 0247 04/04/19 0345 04/05/19 0325 04/06/19 0317  WBC 10.6* 8.9 10.9* 7.3 7.8  HGB 10.0* 9.7* 9.3* 10.5* 10.7*  HCT 32.0* 31.5* 30.9* 34.0* 34.6*  MCV 96.1 96.3 96.3 95.2 95.3  PLT 408* 448* 422* 405* 383   Cardiac Enzymes: No results for input(s): CKTOTAL, CKMB, CKMBINDEX, TROPONINI in the last 168 hours. BNP: BNP (last 3 results) No results for input(s): BNP in the last 8760 hours.  ProBNP (last 3 results) No results for input(s): PROBNP in the last 8760 hours.  CBG: No results for input(s): GLUCAP in the last 168 hours.  Principal Problem:   Diverticulitis of large intestine with perforation Active Problems:   Coronary artery disease PTCA and stents to proximal LAD in 1998, last stress test December 2018-   Dyslipidemia   Essential hypertension   Ileus ([place])   Leukocytosis   Time coordinating discharge: 38 minutes  Signed:        Severn Goddard, DO [facility]  04/06/2019, 5:34 PM

## 2019-04-06 NOTE — Progress Notes (Signed)
Nutrition Education Note  RD consulted for nutrition education regarding nutrition management for diverticulosis/ Diverticulitis.    **[place].  RD reviewed low fiber and high fiber diet information provided in the discharge instructions. Reviewed patient's dietary recall and discussed ways for pt to meet nutrition goals over the next several weeks. Explained reasons for pt to follow a low fiber diet over the next 2 weeks. Reviewed low fiber foods and high fiber foods. Discussed best practice for long term management of diverticulosis is a high fiber diet and discussed ways to gradually increase fiber in the diet.   Teach back method used. Pt verbalizes understanding of information provided.   Expect fair compliance.  Body mass index is Body mass index is 27.31 kg/m.Marland Kitchen Pt meets criteria for overweight based on current BMI.  Current diet order is soft, patient is consuming approximately 50% of meals at this time. Labs and medications reviewed. No further nutrition interventions warranted at this time. If additional nutrition issues arise, please re-consult RD.  Clayton Bibles, MS, RD, LDN Inpatient Clinical Dietitian Pager: [phone] After Hours Pager: [phone]

## 2019-04-06 NOTE — Progress Notes (Addendum)
8 Days Post-Op    CC: Abdominal pain Subjective: Patient is feeling much better this a.m.  He is tolerating a soft diet well.  Port sites look fine.  JP drain has been removed.  His back pain is better now that he is up moving around.  He feels like he is ready for discharge.  Objective: Vital signs in last 24 hours: Temp:  [98.9 F (37.2 C)] 98.9 F (37.2 C) (10/22 0504) Pulse Rate:  [61-65] 61 (10/22 0504) Resp:  [18-20] 20 (10/22 0504) BP: (107-140)/(59-70) 107/59 (10/22 0504) SpO2:  [94 %-99 %] 95 % (10/22 0504) Last BM Date: 04/05/19 1310  PO 0 IV Urine 400 0 drain - JP removed Stool x1 Afebrile vital signs are stable BMP is normal except for a calcium of 8.4.  WBC is 7.8; remainder of CBC is stable.   Intake/Output from previous day: 10/21 0701 - 10/22 0700 In: 1310 [P.O.:1310] Out: 400 [Urine:400] Intake/Output this shift: No intake/output data recorded.  General appearance: alert, cooperative and no distress Resp: clear to auscultation bilaterally GI: Soft, he is much less distended.  His abdomen is not tender to palpation.  Drain has been removed.  Staples in his port sites are healing nicely.  Lab Results:  Recent Labs    04/05/19 0325 04/06/19 0317  WBC 7.3 7.8  HGB 10.5* 10.7*  HCT 34.0* 34.6*  PLT 405* 383    BMET Recent Labs    04/05/19 0325 04/06/19 0317  NA 139 140  K 3.8 4.0  CL 105 104  CO2 26 28  GLUCOSE 94 96  BUN 9 9  CREATININE 0.89 0.90  CALCIUM 8.3* 8.4*   PT/INR No results for input(s): LABPROT, INR in the last 72 hours.  Recent Labs  Lab 04/04/19 0345  AST 25  ALT 21  ALKPHOS 45  BILITOT 0.4  PROT 5.0*  ALBUMIN 1.9*     Lipase     Component Value Date/Time   LIPASE 29 03/20/2019 1006     Medications: . amoxicillin-clavulanate  1 tablet Oral Q12H  . aspirin EC  81 mg Oral Daily  . atenolol  12.5 mg Oral Daily  . atenolol  25 mg Oral Daily  . enoxaparin (LOVENOX) injection  40 mg Subcutaneous Q24H  .  feeding supplement (ENSURE ENLIVE)  237 mL Oral BID BM  . pantoprazole  40 mg Oral Daily  . potassium chloride  20 mEq Oral QPC breakfast  . saccharomyces boulardii  250 mg Oral BID  . sodium chloride flush  3 mL Intravenous Q12H  . traZODone  50 mg Oral QHS    Assessment/Plan AKI - resolved Hx of emphysema Hypertension CAD with prior stent Hyperlipidemia   Diverticulitis with abscess Laparoscopic drainage, intra-abdominal abscess with drain placement, 03/29/2019, Dr. Autumn Messing POD#8   FEN: IV fluids/soft diet ID: Zosyn 10/5-10/8; Invanz 10/9-20; Augmentin 10/21>> day 2 DVT: Lovenox Follow up: Dr. Marlou Starks Pain: Hydrocodone 10 mg x 2;  No Dilaudid yesterday; trazodone 50 mg HS   Plan: From our standpoint he can be discharged home.  We would continue the probiotic for the next month.  10 more days of Augmentin at home.  We discussed discharge instructions.  He already has follow-up with Dr. Marlou Starks in the [facility] along with instructions for a soft diet and then a high-fiber diet.  Postop instructions for his laparoscopic intra-abdominal drainage.  He understands if he has recurrent fever or increased abdominal pain discomfort, trouble voiding, he is to call  and be seen as soon as possible.  Please send a copy of the discharge summary to Dr. Marlou Starks and his PCP.       LOS: 17 days    Andrew Solomon 04/06/2019 Please see Amion

## 2019-04-06 NOTE — Discharge Instructions (Signed)
[facility] ______[facility] °LAPAROSCOPIC SURGERY: POST OP INSTRUCTIONS °Always review your discharge instruction sheet given to you by the facility where your surgery was performed. °IF YOU HAVE DISABILITY OR FAMILY LEAVE FORMS, YOU MUST BRING THEM TO THE OFFICE FOR PROCESSING.   °DO NOT GIVE THEM TO YOUR DOCTOR. ° °1. A prescription for pain medication may be given to you upon discharge.  Take your pain medication as prescribed, if needed.  If narcotic pain medicine is not needed, then you may take acetaminophen (Tylenol) or ibuprofen (Advil) as needed. °2. Take your usually prescribed medications unless otherwise directed. °3. If you need a refill on your pain medication, please contact your pharmacy.  They will contact our office to request authorization. Prescriptions will not be filled after 5pm or on week-ends. °4. You should follow a light diet the first few days after arrival home, such as soup and crackers, etc.  Be sure to include lots of fluids daily. °5. Most patients will experience some swelling and bruising in the area of the incisions.  Ice packs will help.  Swelling and bruising can take several days to resolve.  °6. It is common to experience some constipation if taking pain medication after surgery.  Increasing fluid intake and taking a stool softener (such as Colace) will usually help or prevent this problem from occurring.  A mild laxative (Milk of Magnesia or Miralax) should be taken according to package instructions if there are no bowel movements after 48 hours. °7. Unless discharge instructions indicate otherwise, you may remove your bandages 24-48 hours after surgery, and you may shower at that time.  You may have steri-strips (small skin tapes) in place directly over the incision.  These strips should be left on the skin for 7-10 days.  If your surgeon used skin glue on the incision, you may shower in 24 hours.  The glue will flake off over the next 2-3 weeks.  Any sutures or  staples will be removed at the office during your follow-up visit. °8. ACTIVITIES:  You may resume regular (light) daily activities beginning the next day--such as daily self-care, walking, climbing stairs--gradually increasing activities as tolerated.  You may have sexual intercourse when it is comfortable.  Refrain from any heavy lifting or straining until approved by your doctor. °a. You may drive when you are no longer taking prescription pain medication, you can comfortably wear a seatbelt, and you can safely maneuver your car and apply brakes. °b. RETURN TO WORK:  __________________________________________________________ °9. You should see your doctor in the office for a follow-up appointment approximately 2-3 weeks after your surgery.  Make sure that you call for this appointment within a day or two after you arrive home to insure a convenient appointment time. °10. OTHER INSTRUCTIONS: __________________________________________________________________________________________________________________________ __________________________________________________________________________________________________________________________ °WHEN TO CALL YOUR DOCTOR: °1. Fever over 101.0 °2. Inability to urinate °3. Continued bleeding from incision. °4. Increased pain, redness, or drainage from the incision. °5. Increasing abdominal pain ° °The clinic staff is available to answer your questions during regular business hours.  Please don’t hesitate to call and ask to speak to one of the nurses for clinical concerns.  If you have a medical emergency, go to the nearest emergency room or call 911.  A surgeon from [facility] is always on call at the hospital. °[address], [address], [place], [place]  [address] ? [address] [address], [place],    [address] °[phone] ? [phone] ? FAX [phone] °Web site:   [email or web address]   Low-Fiber Eating Plan for the next 2 weeks then switch  to high fiber below. Fiber is found in fruits, vegetables, whole grains, and beans. Eating a diet low in fiber helps to reduce how often you have bowel movements and how much you produce during a bowel movement. A low-fiber eating plan may help your digestive system heal if:  You have certain conditions, such as Crohn's disease or diverticulitis.  You recently had radiation therapy on your pelvis or bowel.  You recently had intestinal surgery.  You have a new surgical opening in your abdomen (colostomy or ileostomy).  Your intestine is narrowed (stricture). Your health care provider will determine how long you need to stay on this diet. Your health care provider may recommend that you work with a diet and nutrition specialist (dietitian). What are tips for following this plan? General guidelines  Follow recommendations from your dietitian about how much fiber you should have each day.  Most people on this eating plan should try to eat less than 10 grams (g) of fiber each day. Your daily fiber goal is _________________ g.  Take vitamin and mineral supplements as told by your health care provider or dietitian. Chewable or liquid forms are best when on this eating plan. Reading food labels  Check food labels for the amount of dietary fiber.  Choose foods that have less than 2 grams of fiber in one serving. Cooking  Use white flour and other allowed grains for baking and cooking.  Cook meat using methods that keep it tender, such as braising or poaching.  Cook eggs until the yolk is completely solid.  Cook with healthy oils, such as olive oil or canola oil. Meal planning   Eat 5-6 small meals throughout the day instead of 3 large meals.  If you are lactose intolerant: ? Choose low-lactose dairy foods. ? Do not eat dairy foods, if told by your dietitian.  Limit fat and oils to less than 8 teaspoons a day.  Eat small portions of desserts. What foods are allowed? The items  listed below may not be a complete list. Talk with your dietitian about what dietary choices are best for you. Grains All bread and crackers made with white flour. Waffles, pancakes, and [place] toast. Bagels. Pretzels. Melba toast, zwieback, and matzoh. Cooked and dried cereals that do not contain whole grains, added fiber, seeds, or dried fruit. CornmealDomenick Gong. Hot and cold cereals made with refined corn, wheat, rice, or oats. Plain pasta and noodles. White rice. Vegetables Well-cooked or canned vegetables without skin, seeds, or stems. Cooked potatoes without skins. Vegetable juice. Fruits Soft-cooked or canned fruits without skin and seeds. Peeled ripe banana. Applesauce. Fruit juice without pulp. Meats and other protein foods Ground meat. Tender cuts of meat or poultry. Eggs. Fish, seafood, and shellfish. Smooth nut butters. Tofu. Dairy All milk products and drinks. Lactose-free milks, including rice, soy, and almond milks. Yogurt without fruit, nuts, chocolate, or granola mix-ins. Sour cream. Cottage cheese. Cheese. Beverages Decaf coffee. Fruit and vegetable juices or smoothies (in small amounts, with no pulp or skins, and with fruits from allowed list). Sports drinks. Herbal tea. Fats and oils Olive oil, canola oil, sunflower oil, flaxseed oil, and grapeseed oil. Mayonnaise. Cream cheese. Margarine. Butter. Sweets and desserts Plain cakes and cookies. Cream pies and pies made with allowed fruits. Pudding. Custard. Fruit gelatin. Sherbet. Popsicles. Ice cream without nuts. Plain hard candy. Honey. Jelly. Molasses. Syrups, including chocolate syrup. Chocolate. Marshmallows. Gumdrops. Seasoning  and other foods Bouillon. Broth. Cream soups made from allowed foods. Strained soup. Casseroles made with allowed foods. Ketchup. Mild mustard. Mild salad dressings. Plain gravies. Vinegar. Spices in moderation. Salt. Sugar. What foods are not allowed? The items listed below may not be a complete  list. Talk with your dietitian about what dietary choices are best for you. Grains Whole wheat and whole grain breads and crackers. Multigrain breads and crackers. Rye bread. Whole grain or multigrain cereals. Cereals with nuts, raisins, or coconut. Bran. Coarse wheat cereals. Granola. High-fiber cereals. Cornmeal or corn bread. Whole grain pasta. Wild or brown rice. Quinoa. Popcorn. Buckwheat. Wheat germ. Vegetables Potato skins. Raw or undercooked vegetables. All beans and bean sprouts. Cooked greens. Corn. Peas. Cabbage. Beets. Broccoli. Brussels sprouts. Cauliflower. Mushrooms. Onions. Peppers. Parsnips. Okra. Sauerkraut. Fruit Raw or dried fruit. Berries. Fruit juice with pulp. Prune juice. Meats and other protein foods Tough, fibrous meats with gristle. Fatty meat. Poultry with skin. Fried meat, Sales executive, or fish. Deli or lunch meats. Sausage, bacon, and hot dogs. Nuts and chunky nut butter. Dried peas, beans, and lentils. Dairy Yogurt with fruit, nuts, chocolate, or granola mix-ins. Beverages Caffeinated coffee and teas. Fats and oils Avocado. Coconut. Sweets and desserts Desserts, cookies, or candies that contain nuts or coconut. Dried fruit. Jams and preserves with seeds. Marmalade. Any dessert made with fruits or grains that are not allowed. Seasoning and other foods Corn tortilla chips. Soups made with vegetables or grains that are not allowed. Relish. Horseradish. Angie Fava. Olives. Summary  Most people on a low-fiber eating plan should eat less than 10 grams of fiber a day. Follow recommendations from your dietitian about how much fiber you should have each day.  Always check food labels to see the dietary fiber content of packaged foods. In general, a low-fiber food will have fewer than 2 grams of fiber per serving.  In general, try to avoid whole grains, raw fruits and vegetables, dried fruit, tough cuts of meat, nuts, and seeds.  Take a vitamin and mineral supplement as told  by your health care provider or dietitian. This information is not intended to replace advice given to you by your health care provider. Make sure you discuss any questions you have with your health care provider. Document Released: 11/21/2001 Document Revised: 09/23/2018 Document Reviewed: 08/04/2016 Elsevier Patient Education  [place].    High-Fiber Diet Fiber, also called dietary fiber, is a type of carbohydrate that is found in fruits, vegetables, whole grains, and beans. A high-fiber diet can have many health benefits. Your health care provider may recommend a high-fiber diet to help:  Prevent constipation. Fiber can make your bowel movements more regular.  Lower your cholesterol.  Relieve the following conditions: ? Swelling of veins in the anus (hemorrhoids). ? Swelling and irritation (inflammation) of specific areas of the digestive tract (uncomplicated diverticulosis). ? A problem of the large intestine (colon) that sometimes causes pain and diarrhea (irritable bowel syndrome, IBS).  Prevent overeating as part of a weight-loss plan.  Prevent heart disease, type 2 diabetes, and certain cancers. What is my plan? The recommended daily fiber intake in grams (g) includes:  38 g for men age 70 or younger.  30 g for men over age 65.  10 g for women age 30 or younger.  21 g for women over age 40. You can get the recommended daily intake of dietary fiber by:  Eating a variety of fruits, vegetables, grains, and beans.  Taking a fiber supplement,  if it is not possible to get enough fiber through your diet. What do I need to know about a high-fiber diet?  It is better to get fiber through food sources rather than from fiber supplements. There is not a lot of research about how effective supplements are.  Always check the fiber content on the nutrition facts label of any prepackaged food. Look for foods that contain 5 g of fiber or more per serving.  Talk with a diet  and nutrition specialist (dietitian) if you have questions about specific foods that are recommended or not recommended for your medical condition, especially if those foods are not listed below.  Gradually increase how much fiber you consume. If you increase your intake of dietary fiber too quickly, you may have bloating, cramping, or gas.  Drink plenty of water. Water helps you to digest fiber. What are tips for following this plan?  Eat a wide variety of high-fiber foods.  Make sure that half of the grains that you eat each day are whole grains.  Eat breads and cereals that are made with whole-grain flour instead of refined flour or white flour.  Eat brown rice, bulgur wheat, or millet instead of white rice.  Start the day with a breakfast that is high in fiber, such as a cereal that contains 5 g of fiber or more per serving.  Use beans in place of meat in soups, salads, and pasta dishes.  Eat high-fiber snacks, such as berries, raw vegetables, nuts, and popcorn.  Choose whole fruits and vegetables instead of processed forms like juice or sauce. What foods can I eat?  Fruits Berries. Pears. Apples. Oranges. Avocado. Prunes and raisins. Dried figs. Vegetables Sweet potatoes. Spinach. Kale. Artichokes. Cabbage. Broccoli. Cauliflower. Green peas. Carrots. Squash. Grains Whole-grain breads. Multigrain cereal. Oats and oatmeal. Brown rice. Barley. Bulgur wheat. [place]. Quinoa. Bran muffins. Popcorn. Rye wafer crackers. Meats and other proteins [place], kidney, and pinto beans. Soybeans. Split peas. Lentils. Nuts and seeds. Dairy Fiber-fortified yogurt. Beverages Fiber-fortified soy milk. Fiber-fortified orange juice. Other foods Fiber bars. The items listed above may not be a complete list of recommended foods and beverages. Contact a dietitian for more options. What foods are not recommended? Fruits Fruit juice. Cooked, strained fruit. Vegetables Fried potatoes. Canned  vegetables. Well-cooked vegetables. Grains White bread. Pasta made with refined flour. White rice. Meats and other proteins Fatty cuts of meat. Fried chicken or fried fish. Dairy Milk. Yogurt. Cream cheese. Sour cream. Fats and oils Butters. Beverages Soft drinks. Other foods Cakes and pastries. The items listed above may not be a complete list of foods and beverages to avoid. Contact a dietitian for more information. Summary  Fiber is a type of carbohydrate. It is found in fruits, vegetables, whole grains, and beans.  There are many health benefits of eating a high-fiber diet, such as preventing constipation, lowering blood cholesterol, helping with weight loss, and reducing your risk of heart disease, diabetes, and certain cancers.  Gradually increase your intake of fiber. Increasing too fast can result in cramping, bloating, and gas. Drink plenty of water while you increase your fiber.  The best sources of fiber include whole fruits and vegetables, whole grains, nuts, seeds, and beans. This information is not intended to replace advice given to you by your health care provider. Make sure you discuss any questions you have with your health care provider. Document Released: 06/01/2005 Document Revised: 04/05/2017 Document Reviewed: 04/05/2017 Elsevier Patient Education  2020 [employer].

## 2019-04-06 NOTE — Progress Notes (Signed)
Discharge and medication instructions reviewed with patient. Questions answered and patient denies further questions. No prescriptions given. Family member is en route to drive patient home. Donne Hazel, RN

## 2019-04-20 ENCOUNTER — Ambulatory Visit: Payer: Self-pay | Admitting: General Surgery

## 2019-04-25 DIAGNOSIS — K5730 Diverticulosis of large intestine without perforation or abscess without bleeding: Secondary | ICD-10-CM | POA: Diagnosis not present

## 2019-04-25 DIAGNOSIS — R634 Abnormal weight loss: Secondary | ICD-10-CM | POA: Diagnosis not present

## 2019-04-25 DIAGNOSIS — Z800 Family history of malignant neoplasm of digestive organs: Secondary | ICD-10-CM | POA: Diagnosis not present

## 2019-04-25 DIAGNOSIS — Z1211 Encounter for screening for malignant neoplasm of colon: Secondary | ICD-10-CM | POA: Diagnosis not present

## 2019-05-18 ENCOUNTER — Other Ambulatory Visit: Payer: Self-pay | Admitting: Cardiology

## 2019-06-19 ENCOUNTER — Other Ambulatory Visit (HOSPITAL_COMMUNITY): Payer: Medicare Other

## 2019-06-22 ENCOUNTER — Inpatient Hospital Stay: Admit: 2019-06-22 | Payer: Medicare Other | Admitting: General Surgery

## 2019-06-22 SURGERY — COLECTOMY, SIGMOID, LAPAROSCOPIC
Anesthesia: General

## 2019-08-08 ENCOUNTER — Telehealth: Payer: Self-pay | Admitting: Cardiology

## 2019-08-08 NOTE — Telephone Encounter (Signed)
Spoke with pt and pt will come to visit fasting and if any blood work needed will do after visit .Adonis Housekeeper

## 2019-08-08 NOTE — Telephone Encounter (Signed)
Patient states he is requesting orders for lab work.

## 2019-08-10 ENCOUNTER — Other Ambulatory Visit: Payer: Self-pay | Admitting: Cardiology

## 2019-08-11 ENCOUNTER — Other Ambulatory Visit: Payer: Self-pay

## 2019-08-11 ENCOUNTER — Telehealth: Payer: Self-pay | Admitting: Emergency Medicine

## 2019-08-11 ENCOUNTER — Telehealth (INDEPENDENT_AMBULATORY_CARE_PROVIDER_SITE_OTHER): Payer: Medicare Other | Admitting: Cardiology

## 2019-08-11 ENCOUNTER — Encounter: Payer: Self-pay | Admitting: Cardiology

## 2019-08-11 VITALS — BP 113/72 | HR 56 | Wt 156.0 lb

## 2019-08-11 DIAGNOSIS — E785 Hyperlipidemia, unspecified: Secondary | ICD-10-CM

## 2019-08-11 DIAGNOSIS — I2510 Atherosclerotic heart disease of native coronary artery without angina pectoris: Secondary | ICD-10-CM

## 2019-08-11 DIAGNOSIS — I10 Essential (primary) hypertension: Secondary | ICD-10-CM | POA: Diagnosis not present

## 2019-08-11 DIAGNOSIS — K5720 Diverticulitis of large intestine with perforation and abscess without bleeding: Secondary | ICD-10-CM

## 2019-08-11 MED ORDER — NITROGLYCERIN 0.4 MG SL SUBL: 0.4000 mg | SUBLINGUAL_TABLET | SUBLINGUAL | 11 refills | Status: DC | PRN

## 2019-08-11 NOTE — Telephone Encounter (Signed)
Called patient back. Informed him of echocardiogram appointment and scheduled him for follow up appointment.

## 2019-08-11 NOTE — Addendum Note (Signed)
Addended by: Ashok Norris on: 08/11/2019 10:19 AM   Modules accepted: Orders

## 2019-08-11 NOTE — Telephone Encounter (Signed)
Left message for patient to return call regarding virtual visit today.

## 2019-08-11 NOTE — Patient Instructions (Signed)
Medication Instructions:  Your physician recommends that you continue on your current medications as directed. Please refer to the Current Medication list given to you today.  *If you need a refill on your cardiac medications before your next appointment, please call your pharmacy*   Lab Work: None If you have labs (blood work) drawn today and your tests are completely normal, you will receive your results only by: . MyChart Message (if you have MyChart) OR . A paper copy in the mail If you have any lab test that is abnormal or we need to change your treatment, we will call you to review the results.   Testing/Procedures: .Your physician has requested that you have an echocardiogram. Echocardiography is a painless test that uses sound waves to create images of your heart. It provides your doctor with information about the size and shape of your heart and how well your heart's chambers and valves are working. This procedure takes approximately one hour. There are no restrictions for this procedure.     Follow-Up: At [facility], you and your health needs are our priority.  As part of our continuing mission to provide you with exceptional heart care, we have created designated Provider Care Teams.  These Care Teams include your primary Cardiologist (physician) and Advanced Practice Providers (APPs -  Physician Assistants and Nurse Practitioners) who all work together to provide you with the care you need, when you need it.  We recommend signing up for the patient portal called "MyChart".  Sign up information is provided on this After Visit Summary.  MyChart is used to connect with patients for Virtual Visits (Telemedicine).  Patients are able to view lab/test results, encounter notes, upcoming appointments, etc.  Non-urgent messages can be sent to your provider as well.   To learn more about what you can do with MyChart, go to [email or web address].com.    Your next appointment:   4  month(s)  The format for your next appointment:   In Person  Provider:   Robert Krasowski, MD   Other Instructions   Echocardiogram An echocardiogram is a procedure that uses painless sound waves (ultrasound) to produce an image of the heart. Images from an echocardiogram can provide important information about:  Signs of coronary artery disease (CAD).  Aneurysm detection. An aneurysm is a weak or damaged part of an artery wall that bulges out from the normal force of blood pumping through the body.  Heart size and shape. Changes in the size or shape of the heart can be associated with certain conditions, including heart failure, aneurysm, and CAD.  Heart muscle function.  Heart valve function.  Signs of a past heart attack.  Fluid buildup around the heart.  Thickening of the heart muscle.  A tumor or infectious growth around the heart valves. Tell a health care provider about:  Any allergies you have.  All medicines you are taking, including vitamins, herbs, eye drops, creams, and over-the-counter medicines.  Any blood disorders you have.  Any surgeries you have had.  Any medical conditions you have.  Whether you are pregnant or may be pregnant. What are the risks? Generally, this is a safe procedure. However, problems may occur, including:  Allergic reaction to dye (contrast) that may be used during the procedure. What happens before the procedure? No specific preparation is needed. You may eat and drink normally. What happens during the procedure?   An IV tube may be inserted into one of your veins.  You may receive   contrast through this tube. A contrast is an injection that improves the quality of the pictures from your heart.  A gel will be applied to your chest.  A wand-like tool (transducer) will be moved over your chest. The gel will help to transmit the sound waves from the transducer.  The sound waves will harmlessly bounce off of your heart to  allow the heart images to be captured in real-time motion. The images will be recorded on a computer. The procedure may vary among health care providers and hospitals. What happens after the procedure?  You may return to your normal, everyday life, including diet, activities, and medicines, unless your health care provider tells you not to do that. Summary  An echocardiogram is a procedure that uses painless sound waves (ultrasound) to produce an image of the heart.  Images from an echocardiogram can provide important information about the size and shape of your heart, heart muscle function, heart valve function, and fluid buildup around your heart.  You do not need to do anything to prepare before this procedure. You may eat and drink normally.  After the echocardiogram is completed, you may return to your normal, everyday life, unless your health care provider tells you not to do that. This information is not intended to replace advice given to you by your health care provider. Make sure you discuss any questions you have with your health care provider. Document Revised: 09/22/2018 Document Reviewed: 07/04/2016 Elsevier Patient Education  2020 Elsevier Inc.   

## 2019-08-11 NOTE — Telephone Encounter (Signed)
Follow Up  Pt is returning Hayley's phone call regarding today's virtual visit.  Please call back

## 2019-08-11 NOTE — Progress Notes (Signed)
Virtual Visit via Video Note   This visit type was conducted due to national recommendations for restrictions regarding the COVID-19 Pandemic (e.g. social distancing) in an effort to limit this patient's exposure and mitigate transmission in our community.  Due to his co-morbid illnesses, this patient is at least at moderate risk for complications without adequate follow up.  This format is felt to be most appropriate for this patient at this time.  All issues noted in this document were discussed and addressed.  A limited physical exam was performed with this format.  Please refer to the patient's chart for his consent to telehealth for [facility].  Evaluation Performed:  Follow-up visit  This visit type was conducted due to national recommendations for restrictions regarding the COVID-19 Pandemic (e.g. social distancing).  This format is felt to be most appropriate for this patient at this time.  All issues noted in this document were discussed and addressed.  No physical exam was performed (except for noted visual exam findings with Video Visits).  Please refer to the patient's chart (MyChart message for video visits and phone note for telephone visits) for the patient's consent to telehealth for [facility].  Date:  08/11/2019  ID: CHRISTINE SCHWEITZER, DOB Jan 05, 1951, MRN [id number]   Patient Location: [address] [place] [address]   Provider location:   [place] Office  PCP:  Harlan Stains, MD  Cardiologist:  Jenne Campus, MD     Chief Complaint: Doing much better  History of Present Illness:    DINESH GASSETT is a 69 y.o. male  who presents via audio/video conferencing for a telehealth visit today.  With past medical history significant for coronary artery disease, moderate 20 years ago he received stent to left anterior descending artery.  Since that time he is doing well, asymptomatic.  At the end of last year he ended up going to hospital because of  diverticulitis, it was complicated by abscess and eventually required abdominal surgery.  He spent 17 days in the hospital he lost significant amount of weight close to 20 pounds however now he is back home and he is doing better.  He said he is able to walk and climb stairs with no difficulties.  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  For example last week he have to go to [facility], he had  to carry some heavy object while being done and have no difficulty doing it.  Denies having any chest pain tightness squeezing pressure burning chest no swelling of lower extremities.   The patient does not have symptoms concerning for COVID-19 infection (fever, chills, cough, or new SHORTNESS OF BREATH).    Prior CV studies:   The following studies were reviewed today:  No recent cardiac testing done     Past Medical History:  Diagnosis Date  . Heart disease   . HTN (hypertension)   . Hyperlipidemia     Past Surgical History:  Procedure Laterality Date  . 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 CORONARY STENT PLACEMENT Left    left main trunk artery  . LAPAROSCOPIC ABDOMINAL EXPLORATION N/A 03/29/2019   Procedure: LAPAROSCOPIC DRAINAGE INTRA-ABDOMINAL ABSCESS;  Surgeon: Jovita Kussmaul, MD;  Location: [facility];  Service: General;  Laterality: N/A;  . left inguinal  hernia repair       Current Meds  Medication Sig  . acetaminophen (TYLENOL) 325 MG tablet Take 650 mg by mouth every 6 (six) hours as needed for mild pain or headache.  Marland Kitchen aspirin EC 81 MG tablet Take 81 mg by mouth daily.  Marland Kitchen atenolol (TENORMIN) 25 MG tablet TAKE 1 TABLET(25 MG) BY MOUTH DAILY  . buPROPion (WELLBUTRIN SR) 150 MG 12 hr tablet Take 150 mg by mouth daily.  . Cholecalciferol (VITAMIN D3) 1000 units CAPS Take 1 capsule by mouth daily.  Marland Kitchen ezetimibe (ZETIA) 10 MG tablet TAKE 1 TABLET  BY MOUTH EVERY DAY  . omeprazole (PRILOSEC) 40 MG capsule Take 40 mg by mouth daily.  . protein supplement shake (PREMIER PROTEIN) LIQD Take 11 oz by mouth 2 (two) times daily.  . rosuvastatin (CRESTOR) 40 MG tablet TAKE 1 TABLET BY MOUTH EVERY DAY ON MONDAY, WEDNESDAY AND FRIDAY AND TAKE 1/2 A TABLET ON TUES, THURSDAY, SATURDAY AND SUNDAY (Patient taking differently: Take 20-40 mg by mouth daily. 40mg  on Monday, Wednesday, Friday 20mg  on Tuesday, Thursday, Saturday, sunday)  . saccharomyces boulardii (FLORASTOR) 250 MG capsule This is the probiotic.  You can buy this over-the-counter at any drugstore without a prescription.  They come in 28-day packages.  You can pick anyone, and use for the next 28 days.  Just follow the package instructions for that particular brand and type.  . Selenium (SELENIMIN PO) Take 1 capsule by mouth daily.  Marland Kitchen tiZANidine (ZANAFLEX) 4 MG tablet Take 4 mg by mouth every 6 (six) hours as needed for muscle spasms.  . traZODone (DESYREL) 50 MG tablet Take 50 mg by mouth at bedtime.      Family History: The patient's family history includes Bladder Cancer in his mother; Heart disease in his father; Pancreatic cancer in his father.   ROS:   Please see the history of present illness.     All other systems reviewed and are negative.   Labs/Other Tests and Data Reviewed:     Recent Labs: 03/29/2019: Magnesium 1.9 04/04/2019: ALT 21 04/06/2019: BUN 9; Creatinine, Ser 0.90; Hemoglobin 10.7; Platelets 383; Potassium 4.0; Sodium 140  Recent Lipid Panel    Component Value Date/Time   CHOL 120 08/05/2018 1431   TRIG 121 08/05/2018 1431   HDL 36 (L) 08/05/2018 1431   CHOLHDL 3.3 08/05/2018 1431   LDLCALC 60 08/05/2018 1431      Exam:    Vital Signs:  BP 113/72   Pulse (!) 56   Wt 156 lb (70.8 kg)   BMI 24.43 kg/m     Wt Readings from Last 3 Encounters:  08/11/19 156 lb (70.8 kg)  03/20/19 174 lb 6.4 oz (79.1 kg)  02/09/19 179 lb 12.8 oz (81.6 kg)      Well nourished, well developed in no acute distress. Alert awake and x3, not in distress.  We talking over the video link.  He is sitting at home and I am in my home in [place].  Diagnosis for this visit:   1. Coronary artery disease involving native coronary artery of native heart without angina pectoris   2. Essential hypertension   3. Diverticulitis of large intestine with perforation without bleeding   4. Dyslipidemia      ASSESSMENT & PLAN:    1.  Coronary artery disease stable from that point review survive quite dramatic clinical scenario that required surgery with no difficulties.  I will ask him to have an echocardiogram done to  make sure he does not have any significant cardiomyopathy. 2.  Essential hypertension blood pressure well controlled continue present management. 3.  Diverticulitis of large intestine with perforation, status post surgery which was urgent.  Now he is planning to having elective surgery for his bowel resection which will be done in March.  From my cardiac standpoint review I will wait for echocardiogram to assess his risk before the surgery, however in spite of quite devastating sickness he got few months ago he seems to be doing very well walking around carrying heavy objects with no difficulties, therefore I do not anticipate him to have major issues with incoming surgery. 4.  Dyslipidemia I will wait with rechecking of his fasting lipid profile after he will recover fully from his incoming surgery.  COVID-19 Education: The signs and symptoms of COVID-19 were discussed with the patient and how to seek care for testing (follow up with PCP or arrange E-visit).  The importance of social distancing was discussed today.  Patient Risk:   After full review of this patients clinical status, I feel that they are at least moderate risk at this time.  Time:   Today, I have spent 5 minutes with the patient with telehealth technology discussing pt health issues.   I spent 22 minutes reviewing her chart before the visit.  Visit was finished at 10:10 AM.    Medication Adjustments/Labs and Tests Ordered: Current medicines are reviewed at length with the patient today.  Concerns regarding medicines are outlined above.  No orders of the defined types were placed in this encounter.  Medication changes: No orders of the defined types were placed in this encounter.    Disposition: Follow-up in 4 months  Signed, Park Liter, MD, [facility] 08/11/2019 10:06 AM    [place]

## 2019-08-22 NOTE — Patient Instructions (Addendum)
DUE TO COVID-19 ONLY ONE VISITOR IS ALLOWED TO COME WITH YOU AND STAY IN THE WAITING ROOM ONLY DURING PRE OP AND PROCEDURE DAY OF SURGERY. THE 1 VISITOR MAY VISIT WITH YOU AFTER SURGERY IN YOUR PRIVATE ROOM DURING VISITING HOURS ONLY!  YOU NEED TO HAVE A COVID 19 TEST ON: 08/28/19 @ 9:00 am , THIS TEST MUST BE DONE BEFORE SURGERY, COME  [place], [place] , [address].  ([place] [facility]) ONCE YOUR COVID TEST IS COMPLETED, PLEASE BEGIN THE QUARANTINE INSTRUCTIONS AS OUTLINED IN YOUR HANDOUT.                HARTWELL BURKEEN    Your procedure is scheduled on: 08/31/19   Report to [facility] Main  Entrance   Report to admitting at : 8:30 AM     Call this number if you have problems the morning of surgery [phone]    Remember: Do not eat food or drink liquids :After Midnight.   BRUSH YOUR TEETH MORNING OF SURGERY AND RINSE YOUR MOUTH OUT, NO CHEWING GUM CANDY OR MINTS.     Take these medicines the morning of surgery with A SIP OF WATER: BUPROPION,OMEPRAZOLE.                                 You may not have any metal on your body including hair pins and              piercings  Do not wear jewelry,lotions, powders or perfumes, deodorant             Men may shave face and neck.   Do not bring valuables to the hospital. [place].  Contacts, dentures or bridgework may not be worn into surgery.  Leave suitcase in the car. After surgery it may be brought to your room.     Patients discharged the day of surgery will not be allowed to drive home. IF YOU ARE HAVING SURGERY AND GOING HOME THE SAME DAY, YOU MUST HAVE AN ADULT TO DRIVE YOU HOME AND BE WITH YOU FOR 24 HOURS. YOU MAY GO HOME BY TAXI OR UBER OR ORTHERWISE, BUT AN ADULT MUST ACCOMPANY YOU HOME AND STAY WITH YOU FOR 24 HOURS.  Name and phone number of your driver:  Special Instructions: N/A              Please read over the following fact sheets you were  given: _____________________________________________________________________             [facility] - Preparing for Surgery Before surgery, you can play an important role.  Because skin is not sterile, your skin needs to be as free of germs as possible.  You can reduce the number of germs on your skin by washing with CHG (chlorahexidine gluconate) soap before surgery.  CHG is an antiseptic cleaner which kills germs and bonds with the skin to continue killing germs even after washing. Please DO NOT use if you have an allergy to CHG or antibacterial soaps.  If your skin becomes reddened/irritated stop using the CHG and inform your nurse when you arrive at Short Stay. Do not shave (including legs and underarms) for at least 48 hours prior to the first CHG shower.  You may shave your face/neck. Please follow these instructions  carefully:  1.  Shower with CHG Soap the night before surgery and the  morning of Surgery.  2.  If you choose to wash your hair, wash your hair first as usual with your  normal  shampoo.  3.  After you shampoo, rinse your hair and body thoroughly to remove the  shampoo.                           4.  Use CHG as you would any other liquid soap.  You can apply chg directly  to the skin and wash                       Gently with a scrungie or clean washcloth.  5.  Apply the CHG Soap to your body ONLY FROM THE NECK DOWN.   Do not use on face/ open                           Wound or open sores. Avoid contact with eyes, ears mouth and genitals (private parts).                       Wash face,  Genitals (private parts) with your normal soap.             6.  Wash thoroughly, paying special attention to the area where your surgery  will be performed.  7.  Thoroughly rinse your body with warm water from the neck down.  8.  DO NOT shower/wash with your normal soap after using and rinsing off  the CHG Soap.                9.  Pat yourself dry with a clean towel.            10.  Wear  clean pajamas.            11.  Place clean sheets on your bed the night of your first shower and do not  sleep with pets. Day of Surgery : Do not apply any lotions/deodorants the morning of surgery.  Please wear clean clothes to the hospital/surgery center.  FAILURE TO FOLLOW THESE INSTRUCTIONS MAY RESULT IN THE CANCELLATION OF YOUR SURGERY PATIENT SIGNATURE_________________________________  NURSE SIGNATURE__________________________________  ________________________________________________________________________

## 2019-08-22 NOTE — Progress Notes (Signed)
CAN YOU PLEASE PLACE ORDERS FOR THE UPCOMING SURGERY.PT. PST APPOINTMENT IS ON: 08/23/19.[place]

## 2019-08-23 ENCOUNTER — Encounter (HOSPITAL_COMMUNITY): Payer: Self-pay

## 2019-08-23 ENCOUNTER — Ambulatory Visit (HOSPITAL_BASED_OUTPATIENT_CLINIC_OR_DEPARTMENT_OTHER)
Admission: RE | Admit: 2019-08-23 | Discharge: 2019-08-23 | Disposition: A | Discharge status: Home / Self Care | Payer: Medicare Other | Source: Ambulatory Visit | Attending: Cardiology | Admitting: Cardiology

## 2019-08-23 ENCOUNTER — Encounter (HOSPITAL_COMMUNITY)
Admission: RE | Admit: 2019-08-23 | Discharge: 2019-08-23 | Disposition: A | Discharge status: Home / Self Care | Payer: Medicare Other | Source: Ambulatory Visit | Attending: General Surgery | Admitting: General Surgery

## 2019-08-23 ENCOUNTER — Other Ambulatory Visit: Payer: Self-pay

## 2019-08-23 DIAGNOSIS — I2510 Atherosclerotic heart disease of native coronary artery without angina pectoris: Secondary | ICD-10-CM | POA: Insufficient documentation

## 2019-08-23 HISTORY — DX: Malignant (primary) neoplasm, unspecified: C80.1

## 2019-08-23 HISTORY — DX: Pneumonia, unspecified organism: J18.9

## 2019-08-23 HISTORY — DX: Unspecified osteoarthritis, unspecified site: M19.90

## 2019-08-23 NOTE — Progress Notes (Signed)
PCP - Dr. Dema Severin C. LOV: 02/09/19 Cardiologist -   Chest x-ray - 03/21/19 EPIC EKG - 02/09/19 EPIC Stress Test -  ECHO -  Cardiac Cath -   Sleep Study -  CPAP -   Fasting Blood Sugar -  Checks Blood Sugar _____ times a day  Blood Thinner Instructions: Aspirin Instructions: Last Dose:WILL STOP ON 08/28/19  Anesthesia review:   Patient denies shortness of breath, fever, cough and chest pain at PAT appointment   Patient verbalized understanding of instructions that were given to them at the PAT appointment. Patient was also instructed that they will need to review over the PAT instructions again at home before surgery.

## 2019-08-23 NOTE — Progress Notes (Signed)
  Echocardiogram 2D Echocardiogram has been performed.  Cardell Peach 08/23/2019, 3:53 PM

## 2019-08-24 ENCOUNTER — Other Ambulatory Visit: Payer: Self-pay | Admitting: Cardiology

## 2019-08-24 LAB — ECHOCARDIOGRAM COMPLETE
Height: 67 in
Weight: 2480 oz

## 2019-08-25 ENCOUNTER — Encounter (HOSPITAL_COMMUNITY)
Admission: RE | Admit: 2019-08-25 | Discharge: 2019-08-25 | Disposition: A | Discharge status: Home / Self Care | Payer: Medicare Other | Source: Ambulatory Visit | Attending: General Surgery | Admitting: General Surgery

## 2019-08-25 ENCOUNTER — Other Ambulatory Visit: Payer: Self-pay

## 2019-08-25 DIAGNOSIS — E7800 Pure hypercholesterolemia, unspecified: Secondary | ICD-10-CM | POA: Insufficient documentation

## 2019-08-25 DIAGNOSIS — Z7982 Long term (current) use of aspirin: Secondary | ICD-10-CM | POA: Diagnosis not present

## 2019-08-25 DIAGNOSIS — Z01812 Encounter for preprocedural laboratory examination: Secondary | ICD-10-CM | POA: Insufficient documentation

## 2019-08-25 DIAGNOSIS — Z79899 Other long term (current) drug therapy: Secondary | ICD-10-CM | POA: Insufficient documentation

## 2019-08-25 DIAGNOSIS — K219 Gastro-esophageal reflux disease without esophagitis: Secondary | ICD-10-CM | POA: Diagnosis not present

## 2019-08-25 DIAGNOSIS — K5720 Diverticulitis of large intestine with perforation and abscess without bleeding: Secondary | ICD-10-CM | POA: Diagnosis not present

## 2019-08-25 LAB — CBC
HCT: 41.7 % (ref 39.0–52.0)
Hemoglobin: 13.6 g/dL (ref 13.0–17.0)
MCH: 29.2 pg (ref 26.0–34.0)
MCHC: 32.6 g/dL (ref 30.0–36.0)
MCV: 89.7 fL (ref 80.0–100.0)
Platelets: 248 10*3/uL (ref 150–400)
RBC: 4.65 MIL/uL (ref 4.22–5.81)
RDW: 12.9 % (ref 11.5–15.5)
WBC: 8.4 10*3/uL (ref 4.0–10.5)
nRBC: 0 % (ref 0.0–0.2)

## 2019-08-25 LAB — BASIC METABOLIC PANEL
Anion gap: 7 (ref 5–15)
BUN: 28 mg/dL — ABNORMAL HIGH (ref 8–23)
CO2: 26 mmol/L (ref 22–32)
Calcium: 9.6 mg/dL (ref 8.9–10.3)
Chloride: 104 mmol/L (ref 98–111)
Creatinine, Ser: 1.02 mg/dL (ref 0.61–1.24)
GFR calc Af Amer: >60 mL/min (ref >60)
GFR calc non Af Amer: >60 mL/min (ref >60)
Glucose, Bld: 89 mg/dL (ref 70–99)
Potassium: 4.8 mmol/L (ref 3.5–5.1)
Sodium: 137 mmol/L (ref 135–145)

## 2019-08-28 ENCOUNTER — Other Ambulatory Visit (HOSPITAL_COMMUNITY)
Admission: RE | Admit: 2019-08-28 | Discharge: 2019-08-28 | Disposition: A | Discharge status: Home / Self Care | Payer: Medicare Other | Source: Ambulatory Visit | Attending: General Surgery | Admitting: General Surgery

## 2019-08-28 DIAGNOSIS — Z20822 Contact with and (suspected) exposure to covid-19: Secondary | ICD-10-CM | POA: Insufficient documentation

## 2019-08-28 DIAGNOSIS — Z01812 Encounter for preprocedural laboratory examination: Secondary | ICD-10-CM | POA: Insufficient documentation

## 2019-08-28 LAB — SARS CORONAVIRUS 2 (TAT 6-24 HRS): SARS Coronavirus 2: NEGATIVE

## 2019-08-31 ENCOUNTER — Inpatient Hospital Stay (HOSPITAL_COMMUNITY): Payer: Medicare Other | Admitting: Anesthesiology

## 2019-08-31 ENCOUNTER — Inpatient Hospital Stay (HOSPITAL_COMMUNITY)
Admission: RE | Admit: 2019-08-31 | Discharge: 2019-09-05 | DRG: 331 | Disposition: A | Discharge status: Home / Self Care | Payer: Medicare Other | Attending: General Surgery | Admitting: General Surgery

## 2019-08-31 ENCOUNTER — Encounter (HOSPITAL_COMMUNITY)
Admission: RE | Disposition: A | Discharge status: Home / Self Care | Payer: Self-pay | Source: Home / Self Care | Attending: General Surgery

## 2019-08-31 ENCOUNTER — Other Ambulatory Visit: Payer: Self-pay

## 2019-08-31 ENCOUNTER — Encounter (HOSPITAL_COMMUNITY): Payer: Self-pay | Admitting: General Surgery

## 2019-08-31 DIAGNOSIS — Z8249 Family history of ischemic heart disease and other diseases of the circulatory system: Secondary | ICD-10-CM

## 2019-08-31 DIAGNOSIS — K5732 Diverticulitis of large intestine without perforation or abscess without bleeding: Secondary | ICD-10-CM | POA: Diagnosis present

## 2019-08-31 DIAGNOSIS — Z8261 Family history of arthritis: Secondary | ICD-10-CM | POA: Diagnosis not present

## 2019-08-31 DIAGNOSIS — Z87891 Personal history of nicotine dependence: Secondary | ICD-10-CM | POA: Diagnosis not present

## 2019-08-31 DIAGNOSIS — K219 Gastro-esophageal reflux disease without esophagitis: Secondary | ICD-10-CM | POA: Diagnosis present

## 2019-08-31 DIAGNOSIS — Z7982 Long term (current) use of aspirin: Secondary | ICD-10-CM | POA: Diagnosis not present

## 2019-08-31 DIAGNOSIS — Z8349 Family history of other endocrine, nutritional and metabolic diseases: Secondary | ICD-10-CM | POA: Diagnosis not present

## 2019-08-31 DIAGNOSIS — Z20822 Contact with and (suspected) exposure to covid-19: Secondary | ICD-10-CM | POA: Diagnosis present

## 2019-08-31 DIAGNOSIS — E7800 Pure hypercholesterolemia, unspecified: Secondary | ICD-10-CM | POA: Diagnosis not present

## 2019-08-31 DIAGNOSIS — M549 Dorsalgia, unspecified: Secondary | ICD-10-CM | POA: Diagnosis present

## 2019-08-31 DIAGNOSIS — Z8719 Personal history of other diseases of the digestive system: Secondary | ICD-10-CM

## 2019-08-31 DIAGNOSIS — Z79899 Other long term (current) drug therapy: Secondary | ICD-10-CM | POA: Diagnosis not present

## 2019-08-31 DIAGNOSIS — I2510 Atherosclerotic heart disease of native coronary artery without angina pectoris: Secondary | ICD-10-CM | POA: Diagnosis not present

## 2019-08-31 DIAGNOSIS — E785 Hyperlipidemia, unspecified: Secondary | ICD-10-CM | POA: Diagnosis not present

## 2019-08-31 DIAGNOSIS — K5720 Diverticulitis of large intestine with perforation and abscess without bleeding: Secondary | ICD-10-CM | POA: Diagnosis not present

## 2019-08-31 DIAGNOSIS — I10 Essential (primary) hypertension: Secondary | ICD-10-CM | POA: Diagnosis not present

## 2019-08-31 DIAGNOSIS — K5730 Diverticulosis of large intestine without perforation or abscess without bleeding: Secondary | ICD-10-CM | POA: Diagnosis not present

## 2019-08-31 HISTORY — DX: Diverticulitis of large intestine without perforation or abscess without bleeding: K57.32

## 2019-08-31 HISTORY — PX: LAPAROSCOPIC SIGMOID COLECTOMY: SHX5928

## 2019-08-31 LAB — TYPE AND SCREEN
ABO/RH(D): O POS
Antibody Screen: NEGATIVE

## 2019-08-31 LAB — ABO/RH: ABO/RH(D): O POS

## 2019-08-31 SURGERY — COLECTOMY, SIGMOID, LAPAROSCOPIC
Anesthesia: General | Laterality: Left

## 2019-08-31 MED ORDER — ONDANSETRON HCL 4 MG/2ML IJ SOLN: 4.0000 mg | Freq: Four times a day (QID) | INTRAMUSCULAR | Status: DC | PRN

## 2019-08-31 MED ORDER — FENTANYL CITRATE (PF) 100 MCG/2ML IJ SOLN
25.0000 ug | INTRAMUSCULAR | Status: DC | PRN
  Administered 2019-08-31 (×2): 25 ug via INTRAVENOUS

## 2019-08-31 MED ORDER — DEXAMETHASONE SODIUM PHOSPHATE 10 MG/ML IJ SOLN
INTRAMUSCULAR | Status: AC
  Filled 2019-08-31: qty 2

## 2019-08-31 MED ORDER — ROCURONIUM BROMIDE 10 MG/ML (PF) SYRINGE
PREFILLED_SYRINGE | INTRAVENOUS | Status: AC
  Filled 2019-08-31: qty 20

## 2019-08-31 MED ORDER — ONDANSETRON HCL 4 MG/2ML IJ SOLN: 4.0000 mg | Freq: Once | INTRAMUSCULAR | Status: DC | PRN

## 2019-08-31 MED ORDER — PANTOPRAZOLE SODIUM 40 MG IV SOLR
40.0000 mg | INTRAVENOUS | Status: DC
  Administered 2019-08-31 – 2019-09-04 (×5): 40 mg via INTRAVENOUS
  Filled 2019-08-31 (×5): qty 40

## 2019-08-31 MED ORDER — PROPOFOL 10 MG/ML IV BOLUS
INTRAVENOUS | Status: AC
  Filled 2019-08-31: qty 20

## 2019-08-31 MED ORDER — DEXAMETHASONE SODIUM PHOSPHATE 4 MG/ML IJ SOLN
INTRAMUSCULAR | Status: DC | PRN
  Administered 2019-08-31: 10 mg via INTRAVENOUS

## 2019-08-31 MED ORDER — FENTANYL CITRATE (PF) 100 MCG/2ML IJ SOLN
INTRAMUSCULAR | Status: AC
  Filled 2019-08-31: qty 2

## 2019-08-31 MED ORDER — KETAMINE HCL 10 MG/ML IJ SOLN
INTRAMUSCULAR | Status: DC | PRN
  Administered 2019-08-31: 20 mg via INTRAVENOUS

## 2019-08-31 MED ORDER — ROSUVASTATIN CALCIUM 20 MG PO TABS
40.0000 mg | ORAL_TABLET | ORAL | Status: DC
  Administered 2019-09-04: 40 mg via ORAL

## 2019-08-31 MED ORDER — TRAMADOL HCL 50 MG PO TABS
50.0000 mg | ORAL_TABLET | Freq: Four times a day (QID) | ORAL | Status: DC | PRN
  Administered 2019-09-01 – 2019-09-05 (×8): 50 mg via ORAL
  Filled 2019-08-31 (×8): qty 1

## 2019-08-31 MED ORDER — RINGERS IRRIGATION IR SOLN
Status: DC | PRN
  Administered 2019-08-31: 1000 mL

## 2019-08-31 MED ORDER — ONDANSETRON HCL 4 MG/2ML IJ SOLN
INTRAMUSCULAR | Status: AC
  Filled 2019-08-31: qty 4

## 2019-08-31 MED ORDER — HEPARIN SODIUM (PORCINE) 5000 UNIT/ML IJ SOLN
5000.0000 [IU] | Freq: Three times a day (TID) | INTRAMUSCULAR | Status: DC
  Administered 2019-09-01 – 2019-09-05 (×13): 5000 [IU] via SUBCUTANEOUS
  Filled 2019-08-31 (×13): qty 1

## 2019-08-31 MED ORDER — HYDROMORPHONE HCL 1 MG/ML IJ SOLN
0.5000 mg | INTRAMUSCULAR | Status: DC | PRN
  Administered 2019-08-31 – 2019-09-03 (×17): 1 mg via INTRAVENOUS
  Filled 2019-08-31 (×16): qty 1

## 2019-08-31 MED ORDER — SODIUM CHLORIDE 0.9 % IR SOLN
Status: DC | PRN
  Administered 2019-08-31: 2000 mL

## 2019-08-31 MED ORDER — PROPOFOL 10 MG/ML IV BOLUS
INTRAVENOUS | Status: DC | PRN
  Administered 2019-08-31: 150 mg via INTRAVENOUS

## 2019-08-31 MED ORDER — BUPIVACAINE-EPINEPHRINE 0.5% -1:200000 IJ SOLN
INTRAMUSCULAR | Status: DC | PRN
  Administered 2019-08-31: 35 mL

## 2019-08-31 MED ORDER — ONDANSETRON HCL 4 MG/2ML IJ SOLN
INTRAMUSCULAR | Status: DC | PRN
  Administered 2019-08-31: 4 mg via INTRAVENOUS

## 2019-08-31 MED ORDER — ONDANSETRON HCL 4 MG PO TABS
4.0000 mg | ORAL_TABLET | Freq: Four times a day (QID) | ORAL | Status: DC | PRN
  Administered 2019-09-01: 4 mg via ORAL
  Filled 2019-08-31: qty 1

## 2019-08-31 MED ORDER — LACTATED RINGERS IV SOLN: INTRAVENOUS | Status: DC

## 2019-08-31 MED ORDER — HYDROMORPHONE HCL 1 MG/ML IJ SOLN
INTRAMUSCULAR | Status: DC | PRN
  Administered 2019-08-31 (×2): 1 mg via INTRAVENOUS

## 2019-08-31 MED ORDER — KCL IN DEXTROSE-NACL 20-5-0.9 MEQ/L-%-% IV SOLN
INTRAVENOUS | Status: DC
  Filled 2019-08-31 (×4): qty 1000

## 2019-08-31 MED ORDER — EZETIMIBE 10 MG PO TABS
10.0000 mg | ORAL_TABLET | Freq: Every day | ORAL | Status: DC
  Administered 2019-09-01 – 2019-09-05 (×5): 10 mg via ORAL
  Filled 2019-08-31 (×5): qty 1

## 2019-08-31 MED ORDER — ALVIMOPAN 12 MG PO CAPS
12.0000 mg | ORAL_CAPSULE | ORAL | Status: AC
  Administered 2019-08-31: 12 mg via ORAL
  Filled 2019-08-31: qty 1

## 2019-08-31 MED ORDER — SUGAMMADEX SODIUM 200 MG/2ML IV SOLN
INTRAVENOUS | Status: DC | PRN
  Administered 2019-08-31: 200 mg via INTRAVENOUS

## 2019-08-31 MED ORDER — BUPIVACAINE-EPINEPHRINE 0.5% -1:200000 IJ SOLN
INTRAMUSCULAR | Status: AC
  Filled 2019-08-31: qty 1

## 2019-08-31 MED ORDER — BUPROPION HCL ER (XL) 300 MG PO TB24
300.0000 mg | ORAL_TABLET | Freq: Every day | ORAL | Status: DC
  Administered 2019-09-01 – 2019-09-05 (×5): 300 mg via ORAL
  Filled 2019-08-31 (×5): qty 1

## 2019-08-31 MED ORDER — ONDANSETRON HCL 4 MG/2ML IJ SOLN
INTRAMUSCULAR | Status: AC
  Filled 2019-08-31: qty 2

## 2019-08-31 MED ORDER — ACETAMINOPHEN 500 MG PO TABS: 1000.0000 mg | ORAL_TABLET | Freq: Once | ORAL | Status: DC

## 2019-08-31 MED ORDER — LIDOCAINE 2% (20 MG/ML) 5 ML SYRINGE
INTRAMUSCULAR | Status: AC
  Filled 2019-08-31: qty 10

## 2019-08-31 MED ORDER — ROSUVASTATIN CALCIUM 20 MG PO TABS
20.0000 mg | ORAL_TABLET | ORAL | Status: DC
  Administered 2019-09-02 – 2019-09-03 (×2): 20 mg via ORAL
  Filled 2019-08-31 (×4): qty 1

## 2019-08-31 MED ORDER — NITROGLYCERIN 0.4 MG SL SUBL: 0.4000 mg | SUBLINGUAL_TABLET | SUBLINGUAL | Status: DC | PRN

## 2019-08-31 MED ORDER — LIDOCAINE 2% (20 MG/ML) 5 ML SYRINGE
INTRAMUSCULAR | Status: DC | PRN
  Administered 2019-08-31: 60 mg via INTRAVENOUS

## 2019-08-31 MED ORDER — ATENOLOL 25 MG PO TABS
25.0000 mg | ORAL_TABLET | Freq: Every day | ORAL | Status: DC
  Administered 2019-09-01 – 2019-09-05 (×5): 25 mg via ORAL
  Filled 2019-08-31 (×5): qty 1

## 2019-08-31 MED ORDER — ROCURONIUM BROMIDE 10 MG/ML (PF) SYRINGE
PREFILLED_SYRINGE | INTRAVENOUS | Status: DC | PRN
  Administered 2019-08-31: 50 mg via INTRAVENOUS

## 2019-08-31 MED ORDER — HYDROMORPHONE HCL 2 MG/ML IJ SOLN
INTRAMUSCULAR | Status: AC
  Filled 2019-08-31: qty 1

## 2019-08-31 MED ORDER — ACETAMINOPHEN 500 MG PO TABS
ORAL_TABLET | ORAL | Status: AC
  Filled 2019-08-31: qty 2

## 2019-08-31 MED ORDER — FENTANYL CITRATE (PF) 250 MCG/5ML IJ SOLN
INTRAMUSCULAR | Status: DC | PRN
  Administered 2019-08-31 (×6): 50 ug via INTRAVENOUS

## 2019-08-31 MED ORDER — SODIUM CHLORIDE 0.9 % IV SOLN
2.0000 g | Freq: Once | INTRAVENOUS | Status: AC
  Administered 2019-08-31: 2 g via INTRAVENOUS
  Filled 2019-08-31: qty 2

## 2019-08-31 MED ORDER — ALVIMOPAN 12 MG PO CAPS
12.0000 mg | ORAL_CAPSULE | Freq: Two times a day (BID) | ORAL | Status: DC
  Administered 2019-09-01 – 2019-09-04 (×7): 12 mg via ORAL
  Filled 2019-08-31 (×7): qty 1

## 2019-08-31 SURGICAL SUPPLY — 55 items
APPLIER CLIP 5 13 M/L LIGAMAX5 (MISCELLANEOUS)
APPLIER CLIP ROT 10 11.4 M/L (STAPLE)
BLADE EXTENDED COATED 6.5IN (ELECTRODE) IMPLANT
CABLE HIGH FREQUENCY MONO STRZ (ELECTRODE) IMPLANT
CELLS DAT CNTRL 66122 CELL SVR (MISCELLANEOUS) ×1 IMPLANT
CLIP APPLIE 5 13 M/L LIGAMAX5 (MISCELLANEOUS) IMPLANT
CLIP APPLIE ROT 10 11.4 M/L (STAPLE) IMPLANT
COUNTER NEEDLE 20 DBL MAG RED (NEEDLE) ×3 IMPLANT
COVER MAYO STAND STRL (DRAPES) ×9 IMPLANT
COVER WAND RF STERILE (DRAPES) IMPLANT
DECANTER SPIKE VIAL GLASS SM (MISCELLANEOUS) ×3 IMPLANT
DERMABOND ADVANCED (GAUZE/BANDAGES/DRESSINGS)
DERMABOND ADVANCED .7 DNX12 (GAUZE/BANDAGES/DRESSINGS) IMPLANT
DRAPE LAPAROSCOPIC ABDOMINAL (DRAPES) ×3 IMPLANT
DRSG OPSITE POSTOP 4X10 (GAUZE/BANDAGES/DRESSINGS) IMPLANT
DRSG OPSITE POSTOP 4X6 (GAUZE/BANDAGES/DRESSINGS) IMPLANT
DRSG OPSITE POSTOP 4X8 (GAUZE/BANDAGES/DRESSINGS) ×3 IMPLANT
DRSG TEGADERM 2-3/8X2-3/4 SM (GAUZE/BANDAGES/DRESSINGS) ×3 IMPLANT
ELECT REM PT RETURN 15FT ADLT (MISCELLANEOUS) ×3 IMPLANT
GAUZE SPONGE 2X2 8PLY STRL LF (GAUZE/BANDAGES/DRESSINGS) ×1 IMPLANT
GAUZE SPONGE 4X4 12PLY STRL (GAUZE/BANDAGES/DRESSINGS) IMPLANT
GLOVE BIO SURGEON STRL SZ7.5 (GLOVE) ×6 IMPLANT
GOWN STRL REUS W/TWL XL LVL3 (GOWN DISPOSABLE) ×12 IMPLANT
KIT TURNOVER KIT A (KITS) ×3 IMPLANT
LEGGING LITHOTOMY PAIR STRL (DRAPES) IMPLANT
LIGASURE IMPACT 36 18CM CVD LR (INSTRUMENTS) IMPLANT
PACK COLON (CUSTOM PROCEDURE TRAY) ×3 IMPLANT
PENCIL SMOKE EVACUATOR (MISCELLANEOUS) IMPLANT
RELOAD PROXIMATE 75MM BLUE (ENDOMECHANICALS) ×3 IMPLANT
RTRCTR WOUND ALEXIS 18CM MED (MISCELLANEOUS) ×3
SCISSORS LAP 5X35 DISP (ENDOMECHANICALS) ×3 IMPLANT
SET IRRIG TUBING LAPAROSCOPIC (IRRIGATION / IRRIGATOR) ×3 IMPLANT
SET TUBE SMOKE EVAC HIGH FLOW (TUBING) ×3 IMPLANT
SHEARS HARMONIC ACE PLUS 36CM (ENDOMECHANICALS) ×3 IMPLANT
SLEEVE XCEL OPT CAN 5 100 (ENDOMECHANICALS) ×6 IMPLANT
SPONGE GAUZE 2X2 STER 10/PKG (GAUZE/BANDAGES/DRESSINGS) ×2
STAPLER ECHELON POWER CIR 29 (STAPLE) ×3 IMPLANT
STAPLER PROXIMATE 75MM BLUE (STAPLE) ×3 IMPLANT
STAPLER VISISTAT 35W (STAPLE) IMPLANT
SURGILUBE 2OZ TUBE FLIPTOP (MISCELLANEOUS) ×3 IMPLANT
SUT PDS AB 1 CTX 36 (SUTURE) IMPLANT
SUT PDS AB 1 TP1 96 (SUTURE) IMPLANT
SUT PROLENE 2 0 SH DA (SUTURE) ×3 IMPLANT
SUT SILK 2 0 (SUTURE) ×3
SUT SILK 2 0 SH CR/8 (SUTURE) ×3 IMPLANT
SUT SILK 2-0 18XBRD TIE 12 (SUTURE) ×1 IMPLANT
SUT SILK 3 0 (SUTURE) ×3
SUT SILK 3 0 SH CR/8 (SUTURE) ×3 IMPLANT
SUT SILK 3-0 18XBRD TIE 12 (SUTURE) ×1 IMPLANT
TOWEL OR NON WOVEN STRL DISP B (DISPOSABLE) ×3 IMPLANT
TRAY FOLEY MTR SLVR 16FR STAT (SET/KITS/TRAYS/PACK) ×3 IMPLANT
TROCAR BLADELESS OPT 5 100 (ENDOMECHANICALS) ×3 IMPLANT
TROCAR XCEL NON-BLD 11X100MML (ENDOMECHANICALS) IMPLANT
TUBING CONNECTING 10 (TUBING) ×4 IMPLANT
TUBING CONNECTING 10' (TUBING) ×2

## 2019-08-31 NOTE — H&P (Signed)
Erle Crocker  Location: [facility] Patient #: [id number] DOB: 1951-04-25 Married / Language: English / Race: White Male   History of Present Illness  The patient is a 69 year old male who presents for a follow-up for Abdominal pain. The patient is a 69 year old white male who was recently hospitalized with sigmoid diverticulitis with abscess requiring operative drainage. He tolerated this well and recovered after drainage of the abscess. He feels much better now and denies any abdominal pain. His appetite is good and his bowels are moving regularly. He would like to avoid a colostomy bag if at all possible. His only complaint is of a rattling cough. He denies any fevers or chills.   Problem List/Past Medical  RIGHT INGUINAL HERNIA (K40.90)   Past Surgical History  Colon Polyp Removal - Colonoscopy  Colon Polyp Removal - Open  Hemorrhoidectomy  Open Inguinal Hernia Surgery  Left.  Diagnostic Studies History  Colonoscopy  within last year  Allergies  Sulfabenzamide *CHEMICALS*  Swelling, Rash.  Medication History  Aspirin (81MG  Tablet, Oral) Active. Atenolol (25MG  Tablet, Oral daily) Active. Calcium (500MG  Tablet, Oral bid w/ meals) Active. Crestor (20MG  Tablet, Oral daily) Active. Multivitamin (Oral daily) Active. PriLOSEC (20MG  Capsule DR, Oral daily) Active. TraZODone HCl (50MG  Tablet, Oral at bedtime) Active. Vitamin B Complex (Oral daily) Active. Vitamin D (1000UNIT Tablet, Oral daily) Active. Wellbutrin XL (300MG  Tablet ER 24HR, Oral qam) Active. Vitamin D3 (1000UNIT Tablet, Oral daily) Active. Calcium-Vitamin D (600MG  Tablet Chewable, Oral daily) Active. Medications Reconciled  Social History  Alcohol use  Occasional alcohol use. Caffeine use  Coffee. No drug use  Tobacco use  Former smoker.  Family History  Arthritis  Mother. Cancer  Brother, Mother. Heart Disease  Father. Heart disease in male family member  before age 46  Hypertension  Brother, Mother. Malignant Neoplasm Of Pancreas  Father. Thyroid problems  Brother.  Other Problems Back Pain  Gastroesophageal Reflux Disease  Hemorrhoids  Hypercholesterolemia  Inguinal Hernia  Vascular Disease     Review of Systems General Not Present- Appetite Loss, Chills, Fatigue, Fever, Night Sweats, Weight Gain and Weight Loss. Note: All other systems negative (unless as noted in HPI & included Review of Systems) Skin Not Present- Change in Wart/Mole, Dryness, Hives, Jaundice, New Lesions, Non-Healing Wounds, Rash and Ulcer. HEENT Not Present- Earache, Hearing Loss, Hoarseness, Nose Bleed, Oral Ulcers, Ringing in the Ears, Seasonal Allergies, Sinus Pain, Sore Throat, Visual Disturbances, Wears glasses/contact lenses and Yellow Eyes. Respiratory Not Present- Bloody sputum, Chronic Cough, Difficulty Breathing, Snoring and Wheezing. Breast Not Present- Breast Mass, Breast Pain, Nipple Discharge and Skin Changes. Cardiovascular Not Present- Chest Pain, Difficulty Breathing Lying Down, Leg Cramps, Palpitations, Rapid Heart Rate, Shortness of Breath and Swelling of Extremities. Gastrointestinal Not Present- Abdominal Pain, Bloating, Bloody Stool, Change in Bowel Habits, Chronic diarrhea, Constipation, Difficulty Swallowing, Excessive gas, Gets full quickly at meals, Hemorrhoids, Indigestion, Nausea, Rectal Pain and Vomiting. Male Genitourinary Not Present- Blood in Urine, Change in Urinary Stream, Frequency, Impotence, Nocturia, Painful Urination, Urgency and Urine Leakage. Musculoskeletal Not Present- Back Pain, Joint Pain, Joint Stiffness, Muscle Pain, Muscle Weakness and Swelling of Extremities. Neurological Not Present- Decreased Memory, Fainting, Headaches, Numbness, Seizures, Tingling, Tremor, Trouble walking and Weakness. Psychiatric Not Present- Anxiety, Bipolar, Change in Sleep Pattern, Depression, Fearful and Frequent crying. Endocrine  Not Present- Cold Intolerance, Excessive Hunger, Hair Changes, Heat Intolerance and New Diabetes. Hematology Not Present- Easy Bruising, Excessive bleeding, Gland problems, HIV and Persistent Infections.  Vitals  Weight:  159.5 lb Height: 67in Body Surface Area: 1.84 m Body Mass Index: 24.98 kg/m  Pulse: 64 (Regular)  BP: 126/84 (Sitting, Left Arm, Standard)       Physical Exam  General Mental Status-Alert. General Appearance-Consistent with stated age. Hydration-Well hydrated. Voice-Normal.  Head and Neck Head-normocephalic, atraumatic with no lesions or palpable masses. Trachea-midline. Thyroid Gland Characteristics - normal size and consistency.  Eye Eyeball - Bilateral-Extraocular movements intact. Sclera/Conjunctiva - Bilateral-No scleral icterus.  Chest and Lung Exam Chest and lung exam reveals -quiet, even and easy respiratory effort with no use of accessory muscles and on auscultation, normal breath sounds, no adventitious sounds and normal vocal resonance. Inspection Chest Wall - Normal. Back - normal.  Cardiovascular Cardiovascular examination reveals -normal heart sounds, regular rate and rhythm with no murmurs and normal pedal pulses bilaterally.  Abdomen Note: The abdomen is soft and nontender. The firm inflammation in the suprapubic region has significantly improved. Port sites look good.   Neurologic Neurologic evaluation reveals -alert and oriented x 3 with no impairment of recent or remote memory. Mental Status-Normal.  Musculoskeletal Normal Exam - Left-Upper Extremity Strength Normal and Lower Extremity Strength Normal. Normal Exam - Right-Upper Extremity Strength Normal and Lower Extremity Strength Normal.  Lymphatic Head & Neck  General Head & Neck Lymphatics: Bilateral - Description - Normal. Axillary  General Axillary Region: Bilateral - Description - Normal. Tenderness - Non Tender. Femoral &  Inguinal  Generalized Femoral & Inguinal Lymphatics: Bilateral - Description - Normal. Tenderness - Non Tender.    Assessment & Plan DIVERTICULITIS OF LARGE INTESTINE WITH PERFORATION WITHOUT BLEEDING (K57.20) Impression: The patient was recently hospitalized with sigmoid diverticulitis with abscess that required operative drainage. He has recovered well. At this point I think the likelihood is that this could happen again and in order to prevent that we would recommend elective sigmoid resection. I have discussed with him in detail the risks and benefits of the operation as well as some of the technical aspects and he understands and wishes to proceed. I would like to wait about 6-8 weeks before doing the surgery which would put [place] and very late December or early January. I would like him to stay on Augmentin for a couple more weeks given how bad the abscess was. Current Plans Pt Education - [facility] Colon Bowel Prep 2018 ERAS/Miralax/Antibiotics Started Neomycin Sulfate 500 MG Oral Tablet, 2 (two) Tablet SEE NOTE, #6, 04/20/2019, No Refill. Local Order: Pharmacist Notes: TAKE TWO TABLETS AT 2 PM, 3 PM, AND 10 PM THE DAY PRIOR TO SURGERY Started Flagyl 500 MG Oral Tablet, 2 (two) Tablet SEE NOTE, #6, 04/20/2019, No Refill. Local Order: Pharmacist Notes: Take at 2pm, 3pm, and 10pm the day prior to your colon operation Started Amoxicillin-Pot Clavulanate 875-125 MG Oral Tablet, 1 (one) Tablet two times daily, #20, 04/20/2019, Ref. x1.

## 2019-08-31 NOTE — Anesthesia Procedure Notes (Signed)
Procedure Name: Intubation Date/Time: 08/31/2019 10:52 AM Performed by: [place] Alter Moss C, CRNA Pre-anesthesia Checklist: Patient identified, Emergency Drugs available, Suction available and Patient being monitored Patient Re-evaluated:Patient Re-evaluated prior to induction Oxygen Delivery Method: Circle system utilized Preoxygenation: Pre-oxygenation with 100% oxygen Induction Type: IV induction Ventilation: Mask ventilation without difficulty Laryngoscope Size: Mac and 4 Grade View: Grade I Tube type: Oral Tube size: 7.0 mm Number of attempts: 1 Airway Equipment and Method: Stylet and Oral airway Placement Confirmation: ETT inserted through vocal cords under direct vision,  positive ETCO2 and breath sounds checked- equal and bilateral Tube secured with: Tape Dental Injury: Teeth and Oropharynx as per pre-operative assessment

## 2019-08-31 NOTE — Interval H&P Note (Signed)
History and Physical Interval Note:  08/31/2019 9:52 AM  Andrew Solomon  has presented today for surgery, with the diagnosis of SIGMOID DIVERTICULITIS.  The various methods of treatment have been discussed with the patient and family. After consideration of risks, benefits and other options for treatment, the patient has consented to  Procedure(s): LAPAROSCOPIC SIGMOID COLECTOMY (N/A) as a surgical intervention.  The patient's history has been reviewed, patient examined, no change in status, stable for surgery.  I have reviewed the patient's chart and labs.  Questions were answered to the patient's satisfaction.     [name]

## 2019-08-31 NOTE — Op Note (Addendum)
08/31/2019  12:48 PM  PATIENT:  Andrew Solomon  69 y.o. male  PRE-OPERATIVE DIAGNOSIS:  SIGMOID DIVERTICULITIS  POST-OPERATIVE DIAGNOSIS:  sigmoid diverticulitis  PROCEDURE:  Procedure(s): LAPAROSCOPIC ASSISTED SIGMOID COLECTOMY (N/A) RIGID SIGMOIDOSCOPY  SURGEON:  Surgeon(s) and Role:    * Jovita Kussmaul, MD - Primary  PHYSICIAN ASSISTANT:   ASSISTANTS: Dr. Ninfa Linden   ANESTHESIA:   local and general  EBL:  minimal   BLOOD ADMINISTERED:none  DRAINS: none   LOCAL MEDICATIONS USED:  MARCAINE     SPECIMEN:  Source of Specimen:  sigmoid colon  DISPOSITION OF SPECIMEN:  PATHOLOGY  COUNTS:  YES  TOURNIQUET:  * No tourniquets in log *  DICTATION: .Dragon Dictation   After informed consent was obtained the patient was brought to the operating room placed in the supine position on the operating table. After adequate induction of general anesthesia the patient was moved in the lithotomy position and all pressure points were padded. The abdomen was then prepped with ChloraPrep and the perineum was prepped with Betadine, allowed to dry, and draped in usual sterile manner. An appropriate timeout was performed. A site was chosen to access the abdominal cavity and the right lower quadrant. This area was infiltrated with quarter percent Marcaine. A small stab incision was made with a 15 blade knife. A 5 mm Optiview port and camera were used to bluntly dissected the layers of the abdominal wall under direct vision until access was gained to the abdominal cavity. The abdomen is then insufflated with carbon dioxide without difficulty. Another 5 mm port was placed under direct vision in the right upper quadrant and a third port in the left upper quadrant under direct vision. I was then able to mobilize the left colon by incising its retroperitoneal attachment along the white line of Toldt with the harmonic scalpel. I was able to bluntly dissected small bowel adhesions from the colon in the  pelvis. There was a good length of colon and rectum below the involved area that appeared healthy. At this point a lower midline incision was made with a 10 blade knife. The incision was carried through the skin and subcutaneous tissue sharply with the electrocautery. The linea alba was then incised with the electrocautery. The preperitoneal space was probed bluntly with a hemostat until the peritoneum was opened and access was gained to the abdominal cavity. The rest of the incision was opened under direct vision. A wound protractor was deployed. The sigmoid colon was easily able to be examined. There were loops of small bowel densely adherent to the sigmoid colon but we were able to break these fairly readily with blunt finger dissection. We did run into a small interloop abscess cavity that was evacuated. Once the loops were freed we ran the small bowel and there did not appear to be any injury to the small bowel. The sigmoid colon was very mobile at this point. We chose a site above and below the area of involvement where the colon appeared healthy. The mesentery at each of these points was opened sharply with the electrocautery. A GIA-75 stapler was placed across the colon at each of these points, clamped, and fired thereby dividing the colon and upper rectum between staple lines. The proximal and distal segments reached each other easily with no tension. The proximal staple line was removed with the electrocautery. We sized it to a 29 mm EEA. A 2-0 Prolene pursestring stitch was placed around the edge of the proximal colon opening. Next  the anvil was placed in the lumen of the colon and the pursestring stitch was cinched down and tied. At this point Dr. Ninfa Linden went below and was able to bring the EEA stapling device up through the rectum to the staple line without difficulty. The spike was deployed along the healthy anterior wall of the rectosigmoid. The anvil was placed on the spike and the device was  closed completely and held for a minute before firing the device. Once the device was fired the device was then removed. The donuts of bowel were complete and healthy looking. The staple line was then reinforced with multiple 2-0 silk Lembert stitches. A small soft bowel clamp was placed above the staple line. The pelvis was filled with saline. Dr. Ninfa Linden then performed a rigid sigmoidoscopy and there was no evidence of bubbling or leak. The abdomen was then irrigated with copious amounts of saline. At this point all drapes, gowns, and gloves were changed for new. The fascia of the anterior abdominal wall was then closed with 2 running #1 double-stranded looped PDS sutures. The subcutaneous tissue was irrigated with saline and infiltrated with quarter percent Marcaine and the skin incisions were closed with staples. Sterile dressings were applied. The patient tolerated the procedure well. At the end of the case all needle sponge and instrument counts were correct. The patient was then awakened and taken to recovery in stable condition.  PLAN OF CARE: Admit to inpatient   PATIENT DISPOSITION:  PACU - hemodynamically stable.   Delay start of Pharmacological VTE agent (>24hrs) due to surgical blood loss or risk of bleeding: no

## 2019-08-31 NOTE — Anesthesia Preprocedure Evaluation (Addendum)
Anesthesia Evaluation  Patient identified by MRN, date of birth, ID band Patient awake    Reviewed: Allergy & Precautions, NPO status , Patient's Chart, lab work & pertinent test results, reviewed documented beta blocker date and time   Airway Mallampati: III  TM Distance: >3 FB Neck ROM: Full    Dental  (+) Chipped, Poor Dentition, Missing   Pulmonary former smoker,    Pulmonary exam normal breath sounds clear to auscultation       Cardiovascular hypertension, Pt. on home beta blockers + CAD and + Cardiac Stents  Normal cardiovascular exam Rhythm:Regular Rate:Normal  ECG: NSR, rate 75  ECHO: 1. Left ventricular ejection fraction, by estimation, is 55 to 60%. The left ventricle has normal function. The left ventricle has no regional wall motion abnormalities. There is mild concentric left ventricular hypertrophy. Left ventricular diastolic parameters were normal. 2. Right ventricular systolic function is normal. The right ventricular size is normal. There is normal pulmonary artery systolic pressure. 3. The mitral valve is normal in structure. Mild mitral valve regurgitation. No evidence of mitral stenosis. 4. The aortic valve is tricuspid. Aortic valve regurgitation is mild. Mild aortic valve sclerosis is present, with no evidence of aortic valve stenosis. 5. The inferior vena cava is normal in size with greater than 50% respiratory variability, suggesting right atrial pressure of 3 mmHg.  Pre-op eval per Park Liter, MD, [facility]   Neuro/Psych PSYCHIATRIC DISORDERS negative neurological ROS     GI/Hepatic Neg liver ROS, Bowel prep,GERD  Medicated and Controlled,  Endo/Other  negative endocrine ROS  Renal/GU negative Renal ROS     Musculoskeletal negative musculoskeletal ROS (+)   Abdominal   Peds  Hematology HLD   Anesthesia Other Findings SIGMOID DIVERTICULITIS  Reproductive/Obstetrics                             Anesthesia Physical Anesthesia Plan  ASA: III  Anesthesia Plan: General   Post-op Pain Management:    Induction: Intravenous  PONV Risk Score and Plan: 3 and Ondansetron, Dexamethasone, Midazolam and Treatment may vary due to age or medical condition  Airway Management Planned: Oral ETT  Additional Equipment:   Intra-op Plan:   Post-operative Plan: Extubation in OR  Informed Consent: I have reviewed the patients History and Physical, chart, labs and discussed the procedure including the risks, benefits and alternatives for the proposed anesthesia with the patient or authorized representative who has indicated his/her understanding and acceptance.     Dental advisory given  Plan Discussed with: CRNA  Anesthesia Plan Comments:        Anesthesia Quick Evaluation

## 2019-08-31 NOTE — Transfer of Care (Signed)
Immediate Anesthesia Transfer of Care Note  Patient: Andrew Solomon  Procedure(s) Performed: LAPAROSCOPIC SIGMOID COLECTOMY (Left )  Patient Location: PACU  Anesthesia Type:General  Level of Consciousness: awake, alert  and oriented  Airway & Oxygen Therapy: Patient Spontanous Breathing and Patient connected to face mask oxygen  Post-op Assessment: Report given to RN and Post -op Vital signs reviewed and stable  Post vital signs: Reviewed and stable  Last Vitals:  Vitals Value Taken Time  BP 148/80 08/31/19 1315  Temp 37.1 C 08/31/19 1315  Pulse 76 08/31/19 1320  Resp 14 08/31/19 1320  SpO2 100 % 08/31/19 1320  Vitals shown include unvalidated device data.  Last Pain:  Vitals:   08/31/19 1315  TempSrc:   PainSc: 9          Complications: No apparent anesthesia complications

## 2019-08-31 NOTE — Anesthesia Postprocedure Evaluation (Signed)
Anesthesia Post Note  Patient: Andrew Solomon  Procedure(s) Performed: LAPAROSCOPIC SIGMOID COLECTOMY (Left )     Patient location during evaluation: PACU Anesthesia Type: General Level of consciousness: awake and alert Pain management: pain level controlled Vital Signs Assessment: post-procedure vital signs reviewed and stable Respiratory status: spontaneous breathing, nonlabored ventilation, respiratory function stable and patient connected to nasal cannula oxygen Cardiovascular status: blood pressure returned to baseline and stable Postop Assessment: no apparent nausea or vomiting Anesthetic complications: no    Last Vitals:  Vitals:   08/31/19 1400 08/31/19 1416  BP: (!) 145/76 (!) 155/79  Pulse: 77 79  Resp: 12 14  Temp: 36.6 C 36.8 C  SpO2: 98% 100%    Last Pain:  Vitals:   08/31/19 1416  TempSrc:   PainSc: 2                  Aiyah Scarpelli P Jenipher Havel

## 2019-09-01 LAB — CBC
HCT: 39.3 % (ref 39.0–52.0)
Hemoglobin: 13 g/dL (ref 13.0–17.0)
MCH: 29.1 pg (ref 26.0–34.0)
MCHC: 33.1 g/dL (ref 30.0–36.0)
MCV: 87.9 fL (ref 80.0–100.0)
Platelets: 243 10*3/uL (ref 150–400)
RBC: 4.47 MIL/uL (ref 4.22–5.81)
RDW: 12.7 % (ref 11.5–15.5)
WBC: 12.7 10*3/uL — ABNORMAL HIGH (ref 4.0–10.5)
nRBC: 0 % (ref 0.0–0.2)

## 2019-09-01 LAB — BASIC METABOLIC PANEL
Anion gap: 9 (ref 5–15)
BUN: 12 mg/dL (ref 8–23)
CO2: 24 mmol/L (ref 22–32)
Calcium: 8.8 mg/dL — ABNORMAL LOW (ref 8.9–10.3)
Chloride: 105 mmol/L (ref 98–111)
Creatinine, Ser: 0.92 mg/dL (ref 0.61–1.24)
GFR calc Af Amer: >60 mL/min (ref >60)
GFR calc non Af Amer: >60 mL/min (ref >60)
Glucose, Bld: 152 mg/dL — ABNORMAL HIGH (ref 70–99)
Potassium: 4.6 mmol/L (ref 3.5–5.1)
Sodium: 138 mmol/L (ref 135–145)

## 2019-09-01 LAB — SURGICAL PATHOLOGY

## 2019-09-01 MED ORDER — CHLORHEXIDINE GLUCONATE CLOTH 2 % EX PADS
6.0000 | MEDICATED_PAD | Freq: Every day | CUTANEOUS | Status: DC
  Administered 2019-09-01 – 2019-09-03 (×3): 6 via TOPICAL

## 2019-09-01 NOTE — Progress Notes (Signed)
1 Day Post-Op   Subjective/Chief Complaint: Complains of soreness   Objective: Vital signs in last 24 hours: Temp:  [97.9 F (36.6 C)-99.1 F (37.3 C)] 98.3 F (36.8 C) (03/19 0455) Pulse Rate:  [72-83] 72 (03/19 0455) Resp:  [12-18] 16 (03/19 0455) BP: (140-157)/(75-82) 151/78 (03/19 0455) SpO2:  [92 %-100 %] 98 % (03/19 0455) Weight:  [71.8 kg] 71.8 kg (03/19 0600) Last BM Date: 08/30/19  Intake/Output from previous day: 03/18 0701 - 03/19 0700 In: 1932.7 [P.O.:600; I.V.:1232.7; IV Piggyback:100] Out: 2200 [Urine:2200] Intake/Output this shift: Total I/O In: 360 [P.O.:360] Out: 500 [Urine:500]  General appearance: alert and cooperative Resp: clear to auscultation bilaterally Cardio: regular rate and rhythm GI: soft, appropriately tender. incisions look good. few bs  Lab Results:  Recent Labs    09/01/19 0443  WBC 12.7*  HGB 13.0  HCT 39.3  PLT 243   BMET Recent Labs    09/01/19 0443  NA 138  K 4.6  CL 105  CO2 24  GLUCOSE 152*  BUN 12  CREATININE 0.92  CALCIUM 8.8*   PT/INR No results for input(s): LABPROT, INR in the last 72 hours. ABG No results for input(s): PHART, HCO3 in the last 72 hours.  Invalid input(s): PCO2, PO2  Studies/Results: No results found.  Anti-infectives: Anti-infectives (From admission, onward)   Start     Dose/Rate Route Frequency Ordered Stop   08/31/19 1015  cefoTEtan (CEFOTAN) 2 g in sodium chloride 0.9 % 100 mL IVPB     2 g 200 mL/hr over 30 Minutes Intravenous  Once 08/31/19 1002 08/31/19 1053      Assessment/Plan: s/p Procedure(s): LAPAROSCOPIC SIGMOID COLECTOMY (Left) Advance diet slowly ERAS protocols Ambulate Continue entereg D/c foley tomorrow Pod 1  LOS: 1 day    [name] 09/01/2019

## 2019-09-02 MED ORDER — ACETAMINOPHEN 500 MG PO TABS
1000.0000 mg | ORAL_TABLET | Freq: Four times a day (QID) | ORAL | Status: DC | PRN
  Administered 2019-09-02 – 2019-09-04 (×2): 1000 mg via ORAL
  Filled 2019-09-02: qty 2

## 2019-09-02 NOTE — Progress Notes (Signed)
2 Days Post-Op   Subjective/Chief Complaint: Complains of soreness/pain.  Has a headache.  Ambulating in hall.  No flatus but tolerating fulls.  No nausea   Objective: Vital signs in last 24 hours: Temp:  [97.8 F (36.6 C)-98.6 F (37 C)] 98.5 F (36.9 C) (03/20 0413) Pulse Rate:  [57-67] 57 (03/20 0413) Resp:  [15-18] 16 (03/20 0413) BP: (121-134)/(54-76) 121/54 (03/20 0413) SpO2:  [94 %-97 %] 94 % (03/20 0413) Last BM Date: 08/30/19  Intake/Output from previous day: 03/19 0701 - 03/20 0700 In: 2627.6 [P.O.:840; I.V.:1787.6] Out: 1825 [Urine:1825] Intake/Output this shift: No intake/output data recorded.  General appearance: alert and cooperative Resp: clear to auscultation bilaterally Cardio: regular rate and rhythm GI: soft, appropriately tender. incisions look good  Lab Results:  Recent Labs    09/01/19 0443  WBC 12.7*  HGB 13.0  HCT 39.3  PLT 243   BMET Recent Labs    09/01/19 0443  NA 138  K 4.6  CL 105  CO2 24  GLUCOSE 152*  BUN 12  CREATININE 0.92  CALCIUM 8.8*   PT/INR No results for input(s): LABPROT, INR in the last 72 hours. ABG No results for input(s): PHART, HCO3 in the last 72 hours.  Invalid input(s): PCO2, PO2  Studies/Results: No results found.  Anti-infectives: Anti-infectives (From admission, onward)   Start     Dose/Rate Route Frequency Ordered Stop   08/31/19 1015  cefoTEtan (CEFOTAN) 2 g in sodium chloride 0.9 % 100 mL IVPB     2 g 200 mL/hr over 30 Minutes Intravenous  Once 08/31/19 1002 08/31/19 1053      Assessment/Plan: s/p Procedure(s): LAPAROSCOPIC SIGMOID COLECTOMY (Left) Advance diet slowly, will cont fulls until flatus or BM ERAS protocols Ambulate Continue entereg D/c foley  Tylenol for headache Pod 2  LOS: 2 days    Rosario Adie [id number]

## 2019-09-03 NOTE — Plan of Care (Signed)
  Problem: Clinical Measurements: Goal: Will remain free from infection Outcome: Progressing   Problem: Clinical Measurements: Goal: Respiratory complications will improve Outcome: Progressing   Problem: Coping: Goal: Level of anxiety will decrease Outcome: Progressing   Problem: Elimination: Goal: Will not experience complications related to bowel motility Outcome: Progressing   Problem: Pain Managment: Goal: General experience of comfort will improve Outcome: Progressing   Problem: Safety: Goal: Ability to remain free from injury will improve Outcome: Progressing

## 2019-09-03 NOTE — Progress Notes (Signed)
3 Days Post-Op   Subjective/Chief Complaint: Feeling better. Ambulating in hall.  No flatus or BM but tolerating fulls.  No nausea   Objective: Vital signs in last 24 hours: Temp:  [97.5 F (36.4 C)-98.2 F (36.8 C)] 97.5 F (36.4 C) (03/21 0659) Pulse Rate:  [62-69] 68 (03/21 0659) Resp:  [14-20] 20 (03/21 0659) BP: (117-160)/(70-84) 117/84 (03/21 0659) SpO2:  [94 %-97 %] 94 % (03/21 0659) Last BM Date: 08/30/19  Intake/Output from previous day: 03/20 0701 - 03/21 0700 In: 639.8 [P.O.:480; I.V.:159.8] Out: -  Intake/Output this shift: No intake/output data recorded.  General appearance: alert and cooperative Resp: clear to auscultation bilaterally Cardio: regular rate and rhythm GI: soft, appropriately tender. incisions look good, dressings removed  Lab Results:  Recent Labs    09/01/19 0443  WBC 12.7*  HGB 13.0  HCT 39.3  PLT 243   BMET Recent Labs    09/01/19 0443  NA 138  K 4.6  CL 105  CO2 24  GLUCOSE 152*  BUN 12  CREATININE 0.92  CALCIUM 8.8*   PT/INR No results for input(s): LABPROT, INR in the last 72 hours. ABG No results for input(s): PHART, HCO3 in the last 72 hours.  Invalid input(s): PCO2, PO2  Studies/Results: No results found.  Anti-infectives: Anti-infectives (From admission, onward)   Start     Dose/Rate Route Frequency Ordered Stop   08/31/19 1015  cefoTEtan (CEFOTAN) 2 g in sodium chloride 0.9 % 100 mL IVPB     2 g 200 mL/hr over 30 Minutes Intravenous  Once 08/31/19 1002 08/31/19 1053      Assessment/Plan: s/p Procedure(s): LAPAROSCOPIC SIGMOID COLECTOMY (Left) Advance diet slowly, will cont fulls until flatus or BM ERAS protocols Ambulate Continue entereg   LOS: 3 days    Rosario Adie [id number]

## 2019-09-04 NOTE — Progress Notes (Signed)
4 Days Post-Op   Subjective/Chief Complaint: Complains of gas pains. Passing flatus and having bm's   Objective: Vital signs in last 24 hours: Temp:  [98 F (36.7 C)-98.5 F (36.9 C)] 98 F (36.7 C) (03/22 0553) Pulse Rate:  [67-71] 71 (03/22 0553) Resp:  [14-20] 20 (03/22 0553) BP: (119-139)/(69-76) 139/71 (03/22 0553) SpO2:  [93 %-96 %] 93 % (03/22 0553) Weight:  [73.4 kg] 73.4 kg (03/22 0500) Last BM Date: 08/30/19  Intake/Output from previous day: 03/21 0701 - 03/22 0700 In: 360 [P.O.:360] Out: -  Intake/Output this shift: No intake/output data recorded.  General appearance: alert and cooperative Resp: clear to auscultation bilaterally Cardio: regular rate and rhythm GI: soft, mild tenderness. incisions look good. good bs  Lab Results:  No results for input(s): WBC, HGB, HCT, PLT in the last 72 hours. BMET No results for input(s): NA, K, CL, CO2, GLUCOSE, BUN, CREATININE, CALCIUM in the last 72 hours. PT/INR No results for input(s): LABPROT, INR in the last 72 hours. ABG No results for input(s): PHART, HCO3 in the last 72 hours.  Invalid input(s): PCO2, PO2  Studies/Results: No results found.  Anti-infectives: Anti-infectives (From admission, onward)   Start     Dose/Rate Route Frequency Ordered Stop   08/31/19 1015  cefoTEtan (CEFOTAN) 2 g in sodium chloride 0.9 % 100 mL IVPB     2 g 200 mL/hr over 30 Minutes Intravenous  Once 08/31/19 1002 08/31/19 1053      Assessment/Plan: s/p Procedure(s): LAPAROSCOPIC SIGMOID COLECTOMY (Left) Advance diet. Allow soft foods today Ambulate Continue entereg Hopefully will be ready for d/c tomorrow  LOS: 4 days    Andrew Solomon 09/04/2019

## 2019-09-04 NOTE — Progress Notes (Signed)
Patient complained of gas pain, Dr. Marcello Moores paged x 2. Waiting for orders.

## 2019-09-04 NOTE — Care Management Important Message (Signed)
Important Message  Patient Details IM Letter given to [facility] Case Manager to present to the Patient Name: Andrew Solomon MRN: [id number] Date of Birth: 11/26/1950   Medicare Important Message Given:  Yes     Kerin Salen 09/04/2019, 1:54 PM

## 2019-09-05 MED ORDER — TRAMADOL HCL 50 MG PO TABS: 50.0000 mg | ORAL_TABLET | Freq: Four times a day (QID) | ORAL | 0 refills | Status: DC | PRN

## 2019-09-05 NOTE — Progress Notes (Signed)
5 Days Post-Op   Subjective/Chief Complaint: No complaints   Objective: Vital signs in last 24 hours: Temp:  [98 F (36.7 C)-98.2 F (36.8 C)] 98.2 F (36.8 C) (03/23 0541) Pulse Rate:  [68-71] 68 (03/23 0541) Resp:  [14-17] 14 (03/23 0541) BP: (111-120)/(72-76) 111/72 (03/23 0541) SpO2:  [96 %-97 %] 96 % (03/23 0541) Last BM Date: 09/04/19  Intake/Output from previous day: 03/22 0701 - 03/23 0700 In: 240 [P.O.:240] Out: -  Intake/Output this shift: Total I/O In: 240 [P.O.:240] Out: -   General appearance: alert and cooperative Resp: clear to auscultation bilaterally Cardio: regular rate and rhythm GI: soft. minimal tenderness. incision looks good. good bs  Lab Results:  No results for input(s): WBC, HGB, HCT, PLT in the last 72 hours. BMET No results for input(s): NA, K, CL, CO2, GLUCOSE, BUN, CREATININE, CALCIUM in the last 72 hours. PT/INR No results for input(s): LABPROT, INR in the last 72 hours. ABG No results for input(s): PHART, HCO3 in the last 72 hours.  Invalid input(s): PCO2, PO2  Studies/Results: No results found.  Anti-infectives: Anti-infectives (From admission, onward)   Start     Dose/Rate Route Frequency Ordered Stop   08/31/19 1015  cefoTEtan (CEFOTAN) 2 g in sodium chloride 0.9 % 100 mL IVPB     2 g 200 mL/hr over 30 Minutes Intravenous  Once 08/31/19 1002 08/31/19 1053      Assessment/Plan: s/p Procedure(s): LAPAROSCOPIC SIGMOID COLECTOMY (Left) Advance diet Discharge  LOS: 5 days    [name] 09/05/2019

## 2019-09-05 NOTE — Progress Notes (Signed)
Pt alert and oriented, tolerating diet. D/C instructions given, pt d/cd to home. 

## 2019-09-05 NOTE — Discharge Summary (Signed)
Physician Discharge Summary  Patient ID: ALBEN KOSIER MRN: [id number] DOB/AGE: 1951-06-12 69 y.o.  Admit date: 08/31/2019 Discharge date: 09/05/2019  Admission Diagnoses:  Discharge Diagnoses:  Active Problems:   Diverticulitis large intestine   Discharged Condition: good  Hospital Course: the pt underwent lap assisted sigmoid colectomy. He tolerated surgery well. His postop course was uneventful. On pod 5 he was ready for d/c home  Consults: None  Significant Diagnostic Studies: none  Treatments: surgery: as above  Discharge Exam: Blood pressure 111/72, pulse 68, temperature 98.2 F (36.8 C), temperature source Oral, resp. rate 14, height 5\' 7"  (1.702 m), weight 73.4 kg, SpO2 96 %. GI: soft, minimal tenderness. incisions look good  Disposition:     Follow-up Information    [name], MD Follow up in 1 week(s).   Specialty: General Surgery Contact information: [place]  [place] [address] [phone]           Signed: [name] 09/05/2019, 9:36 AM

## 2019-11-22 ENCOUNTER — Other Ambulatory Visit: Payer: Self-pay | Admitting: Cardiology

## 2019-11-27 ENCOUNTER — Encounter: Payer: Self-pay | Admitting: Cardiology

## 2019-11-27 ENCOUNTER — Other Ambulatory Visit: Payer: Self-pay

## 2019-11-27 ENCOUNTER — Ambulatory Visit (INDEPENDENT_AMBULATORY_CARE_PROVIDER_SITE_OTHER): Payer: Medicare Other | Admitting: Cardiology

## 2019-11-27 VITALS — BP 138/72 | HR 58 | Ht 67.0 in | Wt 163.0 lb

## 2019-11-27 DIAGNOSIS — I2510 Atherosclerotic heart disease of native coronary artery without angina pectoris: Secondary | ICD-10-CM

## 2019-11-27 DIAGNOSIS — E785 Hyperlipidemia, unspecified: Secondary | ICD-10-CM

## 2019-11-27 DIAGNOSIS — I10 Essential (primary) hypertension: Secondary | ICD-10-CM | POA: Diagnosis not present

## 2019-11-27 NOTE — Progress Notes (Signed)
Cardiology Office Note:    Date:  11/27/2019   ID:  Andrew Solomon, DOB Nov 05, 1950, MRN [id number]  PCP:  Harlan Stains, MD  Cardiologist:  Jenne Campus, MD    Referring MD: Harlan Stains, MD   Chief Complaint  Patient presents with  . Follow-up  Doing better  History of Present Illness:    Andrew Solomon is a 69 y.o. male with past medical history significant for coronary artery disease status post PTCA and stenting of the left anterior descending artery more than 20 years ago.  Since that time he has been doing quite well.  Recently he is experiencing problem with his sigmoid colon he did have diverticulosis and diverticulitis.  Plans for surgery has been made however because of COVID-19 issues at the beginning of the year that was postponed until March of this year.  He did have colon resection done.  Doing very well from that point review and happy after surgery.  He still works, but he is taking care of his sick wife as well.  He is very busy and have no difficulty doing it.  Denies have any chest pain tightness squeezing pressure burning chest.  Past Medical History:  Diagnosis Date  . Arthritis   . Cancer ([place])    skin cancer  . Heart disease   . HTN (hypertension)   . Hyperlipidemia   . Pneumonia     Past Surgical History:  Procedure Laterality Date  . 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 CORONARY STENT PLACEMENT Left    left main trunk artery  . hemorroids    . LAPAROSCOPIC ABDOMINAL EXPLORATION N/A 03/29/2019   Procedure: LAPAROSCOPIC DRAINAGE INTRA-ABDOMINAL ABSCESS;  Surgeon: Jovita Kussmaul, MD;  Location: [facility];  Service: General;  Laterality: N/A;  . LAPAROSCOPIC SIGMOID COLECTOMY Left 08/31/2019   Procedure: LAPAROSCOPIC SIGMOID COLECTOMY;  Surgeon: Jovita Kussmaul, MD;  Location: [facility];  Service: General;  Laterality: Left;  . left inguinal hernia repair      Current  Medications: Current Meds  Medication Sig  . acetaminophen (TYLENOL) 325 MG tablet Take 1,000 mg by mouth every 6 (six) hours as needed for mild pain or headache.   Marland Kitchen aspirin EC 81 MG tablet Take 81 mg by mouth at bedtime.   Marland Kitchen atenolol (TENORMIN) 25 MG tablet TAKE 1 TABLET(25 MG) BY MOUTH DAILY  . buPROPion (WELLBUTRIN XL) 300 MG 24 hr tablet Take 300 mg by mouth daily.  . Cholecalciferol (VITAMIN D3) 50 MCG (2000 UT) TABS Take 4,000 Units by mouth at bedtime.  Marland Kitchen ezetimibe (ZETIA) 10 MG tablet TAKE 1 TABLET BY MOUTH EVERY DAY  . Multiple Vitamin (MULTIVITAMIN WITH MINERALS) TABS tablet Take 1 tablet by mouth daily. Centrum Silver for Adults 50+  . nitroGLYCERIN (NITROSTAT) 0.4 MG SL tablet Place 1 tablet (0.4 mg total) under the tongue every 5 (five) minutes as needed.  Marland Kitchen omeprazole (PRILOSEC) 20 MG capsule Take 20 mg by mouth every other day. At night  . Probiotic Product (PROBIOTIC PO) Take 1 capsule by mouth at bedtime.  . protein supplement shake (PREMIER PROTEIN) LIQD Take 11 oz by mouth 2 (two) times daily.  . rosuvastatin (CRESTOR) 40 MG tablet TAKE 1 TABLET BY MOUTH EVERY DAY ON MONDAY, WEDNESDAY AND FRIDAY AND TAKE 1/2 A TABLET ON TUES, THURSDAY, SATURDAY AND SUNDAY  . selenium 200 MCG TABS tablet Take 400 mcg  by mouth at bedtime.  Marland Kitchen tiZANidine (ZANAFLEX) 4 MG tablet Take 4 mg by mouth at bedtime.   . traMADol (ULTRAM) 50 MG tablet Take 1 tablet (50 mg total) by mouth every 6 (six) hours as needed for moderate pain.  . traZODone (DESYREL) 50 MG tablet Take 50 mg by mouth at bedtime.     Allergies:   Niacin and related, Oxycodone, and Sulfur   Social History   Socioeconomic History  . Marital status: Married    Spouse name: Not on file  . Number of children: Not on file  . Years of education: Not on file  . Highest education level: Not on file  Occupational History  . Occupation: Press photographer  Tobacco Use  . Smoking status: Former Smoker    Packs/day: 1.00    Years: 47.00     Pack years: 47.00    Types: Cigarettes    Quit date: 06/04/2015    Years since quitting: 4.4  . Smokeless tobacco: Never Used  Vaping Use  . Vaping Use: Never used  Substance and Sexual Activity  . Alcohol use: Yes    Alcohol/week: 0.0 standard drinks    Comment: rare/occ  . Drug use: No  . Sexual activity: Not on file  Other Topics Concern  . Not on file  Social History Narrative  . Not on file   Social Determinants of Health   Financial Resource Strain:   . Difficulty of Paying Living Expenses:   Food Insecurity:   . Worried About Charity fundraiser in the Last Year:   . Arboriculturist in the Last Year:   Transportation Needs:   . Film/video editor (Medical):   Marland Kitchen Lack of Transportation (Non-Medical):   Physical Activity:   . Days of Exercise per Week:   . Minutes of Exercise per Session:   Stress:   . Feeling of Stress :   Social Connections:   . Frequency of Communication with Friends and Family:   . Frequency of Social Gatherings with Friends and Family:   . Attends Religious Services:   . Active Member of Clubs or Organizations:   . Attends Archivist Meetings:   Marland Kitchen Marital Status:      Family History: The patient's family history includes Bladder Cancer in his mother; Heart disease in his father; Pancreatic cancer in his father. ROS:   Please see the history of present illness.    All 14 point review of systems negative except as described per history of present illness  EKGs/Labs/Other Studies Reviewed:      Recent Labs: 03/29/2019: Magnesium 1.9 04/04/2019: ALT 21 09/01/2019: BUN 12; Creatinine, Ser 0.92; Hemoglobin 13.0; Platelets 243; Potassium 4.6; Sodium 138  Recent Lipid Panel    Component Value Date/Time   CHOL 120 08/05/2018 1431   TRIG 121 08/05/2018 1431   HDL 36 (L) 08/05/2018 1431   CHOLHDL 3.3 08/05/2018 1431   LDLCALC 60 08/05/2018 1431    Physical Exam:    VS:  BP 138/72   Pulse (!) 58   Ht 5\' 7"  (1.702 m)    Wt 163 lb (73.9 kg)   SpO2 99%   BMI 25.53 kg/m     Wt Readings from Last 3 Encounters:  11/27/19 163 lb (73.9 kg)  09/04/19 161 lb 13.1 oz (73.4 kg)  08/25/19 160 lb (72.6 kg)     GEN:  Well nourished, well developed in no acute distress HEENT: Normal NECK: No JVD; No carotid  bruits LYMPHATICS: No lymphadenopathy CARDIAC: RRR, no murmurs, no rubs, no gallops RESPIRATORY:  Clear to auscultation without rales, wheezing or rhonchi  ABDOMEN: Soft, non-tender, non-distended MUSCULOSKELETAL:  No edema; No deformity  SKIN: Warm and dry LOWER EXTREMITIES: no swelling NEUROLOGIC:  Alert and oriented x 3 PSYCHIATRIC:  Normal affect   ASSESSMENT:    1. Dyslipidemia   2. Coronary artery disease involving native coronary artery of native heart without angina pectoris   3. Essential hypertension    PLAN:    In order of problems listed above:  1. Coronary artery disease: Status post PTCA and stenting in 1998 doing well from that point review.  We did talk about potentially doing stress test, however, because of the fact that he is doing so well we will postpone it.  In the future hopefully will be able to put him on the treadmill to see how she does. 2. Essential hypertension blood pressure well controlled continue present management. 3. Dyslipidemia: We will check his fasting lipid profile today.  He is on Crestor and Zetia which I will continue.  This is high intensity statin. 4. We did talk about healthy lifestyle need to exercise on regular basis.  I did review his K PN I do have his last fasting lipid profile from August 2020 which show LDL of 70 HDL of 34.  Again we will recheck his fasting lipid profile.   Medication Adjustments/Labs and Tests Ordered: Current medicines are reviewed at length with the patient today.  Concerns regarding medicines are outlined above.  Orders Placed This Encounter  Procedures  . Lipid Profile   Medication changes: No orders of the defined types  were placed in this encounter.   Signed, Park Liter, MD, [facility] 11/27/2019 9:41 AM    [place]

## 2019-11-27 NOTE — Patient Instructions (Signed)

## 2019-11-27 NOTE — Progress Notes (Signed)
pi

## 2019-11-28 LAB — LIPID PANEL
Chol/HDL Ratio: 3.3 ratio (ref 0.0–5.0)
Cholesterol, Total: 139 mg/dL (ref 100–199)
HDL: 42 mg/dL (ref >39)
LDL Chol Calc (NIH): 73 mg/dL (ref 0–99)
Triglycerides: 137 mg/dL (ref 0–149)
VLDL Cholesterol Cal: 24 mg/dL (ref 5–40)

## 2020-03-13 DIAGNOSIS — Z23 Encounter for immunization: Secondary | ICD-10-CM | POA: Diagnosis not present

## 2020-03-13 DIAGNOSIS — M545 Low back pain: Secondary | ICD-10-CM | POA: Diagnosis not present

## 2020-03-13 DIAGNOSIS — K219 Gastro-esophageal reflux disease without esophagitis: Secondary | ICD-10-CM | POA: Diagnosis not present

## 2020-03-13 DIAGNOSIS — J449 Chronic obstructive pulmonary disease, unspecified: Secondary | ICD-10-CM | POA: Diagnosis not present

## 2020-03-13 DIAGNOSIS — F419 Anxiety disorder, unspecified: Secondary | ICD-10-CM | POA: Diagnosis not present

## 2020-03-13 DIAGNOSIS — F17201 Nicotine dependence, unspecified, in remission: Secondary | ICD-10-CM | POA: Diagnosis not present

## 2020-03-13 DIAGNOSIS — I700 Atherosclerosis of aorta: Secondary | ICD-10-CM | POA: Diagnosis not present

## 2020-03-13 DIAGNOSIS — I25811 Atherosclerosis of native coronary artery of transplanted heart without angina pectoris: Secondary | ICD-10-CM | POA: Diagnosis not present

## 2020-05-11 DIAGNOSIS — G510 Bell's palsy: Secondary | ICD-10-CM | POA: Diagnosis not present

## 2020-05-16 ENCOUNTER — Other Ambulatory Visit: Payer: Self-pay | Admitting: Cardiology

## 2020-05-16 NOTE — Telephone Encounter (Signed)
Rx refill sent to pharmacy. 

## 2020-05-28 DIAGNOSIS — J189 Pneumonia, unspecified organism: Secondary | ICD-10-CM | POA: Insufficient documentation

## 2020-05-28 DIAGNOSIS — I10 Essential (primary) hypertension: Secondary | ICD-10-CM | POA: Insufficient documentation

## 2020-05-28 DIAGNOSIS — M1990 Unspecified osteoarthritis, unspecified site: Secondary | ICD-10-CM | POA: Insufficient documentation

## 2020-05-28 DIAGNOSIS — I519 Heart disease, unspecified: Secondary | ICD-10-CM | POA: Insufficient documentation

## 2020-05-28 DIAGNOSIS — E785 Hyperlipidemia, unspecified: Secondary | ICD-10-CM | POA: Insufficient documentation

## 2020-05-28 DIAGNOSIS — C801 Malignant (primary) neoplasm, unspecified: Secondary | ICD-10-CM | POA: Insufficient documentation

## 2020-05-29 ENCOUNTER — Ambulatory Visit: Payer: Medicare Other | Admitting: Cardiology

## 2020-06-26 ENCOUNTER — Ambulatory Visit (INDEPENDENT_AMBULATORY_CARE_PROVIDER_SITE_OTHER): Payer: Medicare HMO | Admitting: Cardiology

## 2020-06-26 ENCOUNTER — Other Ambulatory Visit: Payer: Self-pay

## 2020-06-26 ENCOUNTER — Encounter: Payer: Self-pay | Admitting: Cardiology

## 2020-06-26 VITALS — BP 138/78 | HR 73 | Ht 67.0 in | Wt 191.0 lb

## 2020-06-26 DIAGNOSIS — E785 Hyperlipidemia, unspecified: Secondary | ICD-10-CM | POA: Diagnosis not present

## 2020-06-26 DIAGNOSIS — I2510 Atherosclerotic heart disease of native coronary artery without angina pectoris: Secondary | ICD-10-CM

## 2020-06-26 DIAGNOSIS — E782 Mixed hyperlipidemia: Secondary | ICD-10-CM | POA: Diagnosis not present

## 2020-06-26 DIAGNOSIS — I10 Essential (primary) hypertension: Secondary | ICD-10-CM | POA: Diagnosis not present

## 2020-06-26 NOTE — Patient Instructions (Signed)
Medication Instructions:  Your physician recommends that you continue on your current medications as directed. Please refer to the Current Medication list given to you today.  *If you need a refill on your cardiac medications before your next appointment, please call your pharmacy*  Lab Work: None ordered today  Testing/Procedures: None ordered today  Follow-Up: At [facility], you and your health needs are our priority.  As part of our continuing mission to provide you with exceptional heart care, we have created designated Provider Care Teams.  These Care Teams include your primary Cardiologist (physician) and Advanced Practice Providers (APPs -  Physician Assistants and Nurse Practitioners) who all work together to provide you with the care you need, when you need it.  Your next appointment:   6 month(s)  The format for your next appointment:   In Person  Provider:   Jenne Campus, MD

## 2020-06-26 NOTE — Progress Notes (Signed)
Cardiology Office Note:    Date:  06/26/2020   ID:  Andrew Solomon, DOB 13-Apr-1951, MRN [id number]  PCP:  Harlan Stains, MD  Cardiologist:  Jenne Campus, MD    Referring MD: Harlan Stains, MD   Chief Complaint  Patient presents with  . Follow-up  Am doing fine  History of Present Illness:    Andrew Solomon is a 70 y.o. male with past medical history significant for coronary artery disease he did have PTCA and stenting of the left atrial descending artery more than 20 years ago.  Also does have diverticulosis of the sigmoid colon and eventually end up having surgery in March with no complications also history of hypertension dyslipidemia.  Comes today 2 months of follow-up overall doing well.  Denies have any chest pain tightness squeezing pressure burning chest date he went to surgery with no difficulties and happy about that.  Past Medical History:  Diagnosis Date  . Arthritis   . Cancer ([place])    skin cancer  . Coronary artery disease PTCA and stents to proximal LAD in 1998, last stress test December 2018- 08/05/2018  . Diverticulitis large intestine 08/31/2019  . Diverticulitis of large intestine with perforation 03/20/2019  . Dyslipidemia 08/05/2018  . Essential hypertension 08/05/2018  . Heart disease   . HTN (hypertension)   . Hyperlipidemia   . Ileus ([place]) 03/29/2019  . Leukocytosis 03/29/2019  . Pneumonia     Past Surgical History:  Procedure Laterality Date  . 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 CORONARY STENT PLACEMENT Left    left main trunk artery  . hemorroids    . LAPAROSCOPIC ABDOMINAL EXPLORATION N/A 03/29/2019   Procedure: LAPAROSCOPIC DRAINAGE INTRA-ABDOMINAL ABSCESS;  Surgeon: Jovita Kussmaul, MD;  Location: [facility];  Service: General;  Laterality: N/A;  . LAPAROSCOPIC SIGMOID COLECTOMY Left 08/31/2019   Procedure: LAPAROSCOPIC SIGMOID COLECTOMY;  Surgeon: Jovita Kussmaul, MD;  Location: [facility];  Service: General;  Laterality: Left;  . left inguinal hernia repair      Current Medications: Current Meds  Medication Sig  . acetaminophen (TYLENOL) 325 MG tablet Take 1,000 mg by mouth every 6 (six) hours as needed for mild pain or headache.   Marland Kitchen aspirin EC 81 MG tablet Take 81 mg by mouth at bedtime.   Marland Kitchen atenolol (TENORMIN) 25 MG tablet TAKE 1 TABLET(25 MG) BY MOUTH DAILY  . buPROPion (WELLBUTRIN XL) 300 MG 24 hr tablet Take 300 mg by mouth daily.  . Cholecalciferol (VITAMIN D3) 50 MCG (2000 UT) TABS Take 4,000 Units by mouth at bedtime.  Marland Kitchen ezetimibe (ZETIA) 10 MG tablet TAKE 1 TABLET BY MOUTH EVERY DAY  . Multiple Vitamin (MULTIVITAMIN WITH MINERALS) TABS tablet Take 1 tablet by mouth daily. Centrum Silver for Adults 50+  . nitroGLYCERIN (NITROSTAT) 0.4 MG SL tablet Place 1 tablet (0.4 mg total) under the tongue every 5 (five) minutes as needed.  Marland Kitchen omeprazole (PRILOSEC) 20 MG capsule Take 20 mg by mouth every other day. At night  . Probiotic Product (PROBIOTIC PO) Take 1 capsule by mouth at bedtime.  . protein supplement shake (PREMIER PROTEIN) LIQD Take 11 oz by mouth 2 (two) times daily.  . rosuvastatin (CRESTOR) 40 MG tablet TAKE 1 TABLET BY MOUTH EVERY DAY ON MONDAY, WEDNESDAY AND FRIDAY AND TAKE 1/2 A TABLET ON TUES, THURSDAY, SATURDAY AND SUNDAY  . selenium 200 MCG TABS  tablet Take 400 mcg by mouth at bedtime.  Marland Kitchen tiZANidine (ZANAFLEX) 4 MG tablet Take 4 mg by mouth at bedtime.  . traZODone (DESYREL) 50 MG tablet Take 50 mg by mouth at bedtime.     Allergies:   Niacin and related, Oxycodone, and Sulfur   Social History   Socioeconomic History  . Marital status: Married    Spouse name: Not on file  . Number of children: Not on file  . Years of education: Not on file  . Highest education level: Not on file  Occupational History  . Occupation: Press photographer  Tobacco Use  . Smoking status: Former Smoker    Packs/day: 1.00    Years: 47.00    Pack years: 47.00    Types:  Cigarettes    Quit date: 06/04/2015    Years since quitting: 5.0  . Smokeless tobacco: Never Used  Vaping Use  . Vaping Use: Never used  Substance and Sexual Activity  . Alcohol use: Yes    Alcohol/week: 0.0 standard drinks    Comment: rare/occ  . Drug use: No  . Sexual activity: Not on file  Other Topics Concern  . Not on file  Social History Narrative  . Not on file   Social Determinants of Health   Financial Resource Strain: Not on file  Food Insecurity: Not on file  Transportation Needs: Not on file  Physical Activity: Not on file  Stress: Not on file  Social Connections: Not on file     Family History: The patient's family history includes Bladder Cancer in his mother; Heart disease in his father; Pancreatic cancer in his father. ROS:   Please see the history of present illness.    All 14 point review of systems negative except as described per history of present illness  EKGs/Labs/Other Studies Reviewed:      Recent Labs: 09/01/2019: BUN 12; Creatinine, Ser 0.92; Hemoglobin 13.0; Platelets 243; Potassium 4.6; Sodium 138  Recent Lipid Panel    Component Value Date/Time   CHOL 139 11/27/2019 0945   TRIG 137 11/27/2019 0945   HDL 42 11/27/2019 0945   CHOLHDL 3.3 11/27/2019 0945   LDLCALC 73 11/27/2019 0945    Physical Exam:    VS:  BP 138/78 (BP Location: Right Arm, Patient Position: Sitting)   Pulse 73   Ht 5\' 7"  (1.702 m)   Wt 191 lb (86.6 kg)   SpO2 97%   BMI 29.91 kg/m     Wt Readings from Last 3 Encounters:  06/26/20 191 lb (86.6 kg)  11/27/19 163 lb (73.9 kg)  09/04/19 161 lb 13.1 oz (73.4 kg)     GEN:  Well nourished, well developed in no acute distress HEENT: Normal NECK: No JVD; No carotid bruits LYMPHATICS: No lymphadenopathy CARDIAC: RRR, no murmurs, no rubs, no gallops RESPIRATORY:  Clear to auscultation without rales, wheezing or rhonchi  ABDOMEN: Soft, non-tender, non-distended MUSCULOSKELETAL:  No edema; No deformity  SKIN:  Warm and dry LOWER EXTREMITIES: no swelling NEUROLOGIC:  Alert and oriented x 3 PSYCHIATRIC:  Normal affect   ASSESSMENT:    1. Coronary artery disease involving native coronary artery of native heart without angina pectoris   2. Essential hypertension   3. Mixed hyperlipidemia   4. Dyslipidemia    PLAN:    In order of problems listed above:  1. Coronary disease stable from that point review on appropriate medications which I will continue. 2. Dyslipidemia I did review his K PN which show me data  from 11/27/2019 with LDL of 73 HDL 42.  We will continue present management which include statin. 3. Essential hypertension: Blood pressure well controlled continue present management. 4. Overall he is doing well we did talk about healthy lifestyle need to exercise and regular basis which he tries to do.   Medication Adjustments/Labs and Tests Ordered: Current medicines are reviewed at length with the patient today.  Concerns regarding medicines are outlined above.  Orders Placed This Encounter  Procedures  . EKG 12-Lead   Medication changes: No orders of the defined types were placed in this encounter.   Signed, Park Liter, MD, [facility] 06/26/2020 5:09 PM    [place]

## 2020-08-11 ENCOUNTER — Other Ambulatory Visit: Payer: Self-pay | Admitting: Cardiology

## 2020-08-23 ENCOUNTER — Other Ambulatory Visit: Payer: Self-pay | Admitting: Cardiology

## 2020-08-23 DIAGNOSIS — R69 Illness, unspecified: Secondary | ICD-10-CM | POA: Diagnosis not present

## 2020-08-23 DIAGNOSIS — M25512 Pain in left shoulder: Secondary | ICD-10-CM | POA: Diagnosis not present

## 2020-08-23 DIAGNOSIS — Z125 Encounter for screening for malignant neoplasm of prostate: Secondary | ICD-10-CM | POA: Diagnosis not present

## 2020-08-23 DIAGNOSIS — E785 Hyperlipidemia, unspecified: Secondary | ICD-10-CM | POA: Diagnosis not present

## 2020-08-23 DIAGNOSIS — Z79899 Other long term (current) drug therapy: Secondary | ICD-10-CM | POA: Diagnosis not present

## 2020-08-23 DIAGNOSIS — M5442 Lumbago with sciatica, left side: Secondary | ICD-10-CM | POA: Diagnosis not present

## 2020-08-23 DIAGNOSIS — K219 Gastro-esophageal reflux disease without esophagitis: Secondary | ICD-10-CM | POA: Diagnosis not present

## 2020-08-23 DIAGNOSIS — M25511 Pain in right shoulder: Secondary | ICD-10-CM | POA: Diagnosis not present

## 2020-08-23 NOTE — Telephone Encounter (Signed)
Atenolol approved and sent

## 2020-10-25 DIAGNOSIS — Z6832 Body mass index (BMI) 32.0-32.9, adult: Secondary | ICD-10-CM | POA: Diagnosis not present

## 2020-10-25 DIAGNOSIS — Z008 Encounter for other general examination: Secondary | ICD-10-CM | POA: Diagnosis not present

## 2020-10-25 DIAGNOSIS — N529 Male erectile dysfunction, unspecified: Secondary | ICD-10-CM | POA: Diagnosis not present

## 2020-10-25 DIAGNOSIS — I739 Peripheral vascular disease, unspecified: Secondary | ICD-10-CM | POA: Diagnosis not present

## 2020-10-25 DIAGNOSIS — E669 Obesity, unspecified: Secondary | ICD-10-CM | POA: Diagnosis not present

## 2020-10-25 DIAGNOSIS — I10 Essential (primary) hypertension: Secondary | ICD-10-CM | POA: Diagnosis not present

## 2020-10-25 DIAGNOSIS — E785 Hyperlipidemia, unspecified: Secondary | ICD-10-CM | POA: Diagnosis not present

## 2020-10-25 DIAGNOSIS — K219 Gastro-esophageal reflux disease without esophagitis: Secondary | ICD-10-CM | POA: Diagnosis not present

## 2020-10-25 DIAGNOSIS — M62838 Other muscle spasm: Secondary | ICD-10-CM | POA: Diagnosis not present

## 2020-10-25 DIAGNOSIS — I2510 Atherosclerotic heart disease of native coronary artery without angina pectoris: Secondary | ICD-10-CM | POA: Diagnosis not present

## 2020-10-25 DIAGNOSIS — G4700 Insomnia, unspecified: Secondary | ICD-10-CM | POA: Diagnosis not present

## 2020-11-12 ENCOUNTER — Telehealth: Payer: Self-pay | Admitting: Acute Care

## 2020-11-12 DIAGNOSIS — Z87891 Personal history of nicotine dependence: Secondary | ICD-10-CM

## 2020-11-12 NOTE — Telephone Encounter (Signed)
I have spoke with Andrew Solomon and his LCS CT has been scheduled at [facility] on 11/15/2020 @ 2:30pm and he is aware of the appt and location

## 2020-11-12 NOTE — Telephone Encounter (Signed)
New CT order placed. Will forward to [place]) to schedule.

## 2020-11-15 ENCOUNTER — Other Ambulatory Visit: Payer: Self-pay

## 2020-11-15 ENCOUNTER — Ambulatory Visit (INDEPENDENT_AMBULATORY_CARE_PROVIDER_SITE_OTHER)
Admission: RE | Admit: 2020-11-15 | Discharge: 2020-11-15 | Disposition: A | Discharge status: Home / Self Care | Payer: Medicare HMO | Source: Ambulatory Visit | Attending: Family Medicine | Admitting: Family Medicine

## 2020-11-15 DIAGNOSIS — Z87891 Personal history of nicotine dependence: Secondary | ICD-10-CM | POA: Diagnosis not present

## 2020-11-21 NOTE — Progress Notes (Signed)
Please call patient and let them  know their  low dose Ct was read as a Lung RADS 2: nodules that are benign in appearance and behavior with a very low likelihood of becoming a clinically active cancer due to size or lack of growth. Recommendation per radiology is for a repeat LDCT in 12 months. .Please let them  know we will order and schedule their  annual screening scan for 11/2021. Please let them  know there was notation of CAD on their  scan.  Please remind the patient  that this is a non-gated exam therefore degree or severity of disease  cannot be determined. Please have them  follow up with their PCP regarding potential risk factor modification, dietary therapy or pharmacologic therapy if clinically indicated. Pt.  is  currently on statin therapy. Please place order for annual  screening scan for  11/2021 and fax results to PCP. Thanks so much. 

## 2020-11-26 ENCOUNTER — Other Ambulatory Visit: Payer: Self-pay | Admitting: *Deleted

## 2020-11-26 DIAGNOSIS — Z87891 Personal history of nicotine dependence: Secondary | ICD-10-CM

## 2020-12-04 DIAGNOSIS — H524 Presbyopia: Secondary | ICD-10-CM | POA: Diagnosis not present

## 2020-12-06 DIAGNOSIS — J449 Chronic obstructive pulmonary disease, unspecified: Secondary | ICD-10-CM | POA: Diagnosis not present

## 2020-12-06 DIAGNOSIS — I10 Essential (primary) hypertension: Secondary | ICD-10-CM | POA: Diagnosis not present

## 2020-12-06 DIAGNOSIS — K219 Gastro-esophageal reflux disease without esophagitis: Secondary | ICD-10-CM | POA: Diagnosis not present

## 2020-12-06 DIAGNOSIS — I25811 Atherosclerosis of native coronary artery of transplanted heart without angina pectoris: Secondary | ICD-10-CM | POA: Diagnosis not present

## 2020-12-06 DIAGNOSIS — E785 Hyperlipidemia, unspecified: Secondary | ICD-10-CM | POA: Diagnosis not present

## 2020-12-06 DIAGNOSIS — J4521 Mild intermittent asthma with (acute) exacerbation: Secondary | ICD-10-CM | POA: Diagnosis not present

## 2020-12-09 DIAGNOSIS — D2271 Melanocytic nevi of right lower limb, including hip: Secondary | ICD-10-CM | POA: Diagnosis not present

## 2020-12-09 DIAGNOSIS — L812 Freckles: Secondary | ICD-10-CM | POA: Diagnosis not present

## 2020-12-09 DIAGNOSIS — D1801 Hemangioma of skin and subcutaneous tissue: Secondary | ICD-10-CM | POA: Diagnosis not present

## 2020-12-09 DIAGNOSIS — Z85828 Personal history of other malignant neoplasm of skin: Secondary | ICD-10-CM | POA: Diagnosis not present

## 2020-12-09 DIAGNOSIS — L570 Actinic keratosis: Secondary | ICD-10-CM | POA: Diagnosis not present

## 2020-12-09 DIAGNOSIS — L821 Other seborrheic keratosis: Secondary | ICD-10-CM | POA: Diagnosis not present

## 2020-12-09 DIAGNOSIS — L218 Other seborrheic dermatitis: Secondary | ICD-10-CM | POA: Diagnosis not present

## 2020-12-10 DIAGNOSIS — K219 Gastro-esophageal reflux disease without esophagitis: Secondary | ICD-10-CM | POA: Diagnosis not present

## 2020-12-10 DIAGNOSIS — E785 Hyperlipidemia, unspecified: Secondary | ICD-10-CM | POA: Diagnosis not present

## 2020-12-10 DIAGNOSIS — Z125 Encounter for screening for malignant neoplasm of prostate: Secondary | ICD-10-CM | POA: Diagnosis not present

## 2020-12-10 DIAGNOSIS — Z79899 Other long term (current) drug therapy: Secondary | ICD-10-CM | POA: Diagnosis not present

## 2020-12-10 DIAGNOSIS — D649 Anemia, unspecified: Secondary | ICD-10-CM | POA: Diagnosis not present

## 2020-12-19 DIAGNOSIS — M25512 Pain in left shoulder: Secondary | ICD-10-CM | POA: Diagnosis not present

## 2020-12-20 ENCOUNTER — Ambulatory Visit
Admission: RE | Admit: 2020-12-20 | Discharge: 2020-12-20 | Disposition: A | Discharge status: Home / Self Care | Payer: Medicare HMO | Source: Ambulatory Visit | Attending: Sports Medicine | Admitting: Sports Medicine

## 2020-12-20 ENCOUNTER — Other Ambulatory Visit: Payer: Self-pay | Admitting: Sports Medicine

## 2020-12-20 DIAGNOSIS — M19012 Primary osteoarthritis, left shoulder: Secondary | ICD-10-CM | POA: Diagnosis not present

## 2020-12-20 DIAGNOSIS — M25512 Pain in left shoulder: Secondary | ICD-10-CM

## 2021-01-10 DIAGNOSIS — J449 Chronic obstructive pulmonary disease, unspecified: Secondary | ICD-10-CM | POA: Diagnosis not present

## 2021-01-10 DIAGNOSIS — J4521 Mild intermittent asthma with (acute) exacerbation: Secondary | ICD-10-CM | POA: Diagnosis not present

## 2021-01-10 DIAGNOSIS — I25811 Atherosclerosis of native coronary artery of transplanted heart without angina pectoris: Secondary | ICD-10-CM | POA: Diagnosis not present

## 2021-01-10 DIAGNOSIS — K219 Gastro-esophageal reflux disease without esophagitis: Secondary | ICD-10-CM | POA: Diagnosis not present

## 2021-01-10 DIAGNOSIS — I10 Essential (primary) hypertension: Secondary | ICD-10-CM | POA: Diagnosis not present

## 2021-01-10 DIAGNOSIS — E785 Hyperlipidemia, unspecified: Secondary | ICD-10-CM | POA: Diagnosis not present

## 2021-01-16 DIAGNOSIS — M545 Low back pain, unspecified: Secondary | ICD-10-CM | POA: Diagnosis not present

## 2021-01-16 DIAGNOSIS — M25512 Pain in left shoulder: Secondary | ICD-10-CM | POA: Diagnosis not present

## 2021-01-21 ENCOUNTER — Other Ambulatory Visit: Payer: Self-pay | Admitting: Cardiology

## 2021-01-28 ENCOUNTER — Other Ambulatory Visit: Payer: Self-pay | Admitting: Cardiology

## 2021-03-17 DIAGNOSIS — H353113 Nonexudative age-related macular degeneration, right eye, advanced atrophic without subfoveal involvement: Secondary | ICD-10-CM | POA: Diagnosis not present

## 2021-03-17 DIAGNOSIS — H18593 Other hereditary corneal dystrophies, bilateral: Secondary | ICD-10-CM | POA: Diagnosis not present

## 2021-03-17 DIAGNOSIS — H524 Presbyopia: Secondary | ICD-10-CM | POA: Diagnosis not present

## 2021-03-17 DIAGNOSIS — H2513 Age-related nuclear cataract, bilateral: Secondary | ICD-10-CM | POA: Diagnosis not present

## 2021-03-19 ENCOUNTER — Ambulatory Visit: Payer: Medicare HMO | Admitting: Cardiology

## 2021-04-03 DIAGNOSIS — H25041 Posterior subcapsular polar age-related cataract, right eye: Secondary | ICD-10-CM | POA: Diagnosis not present

## 2021-04-03 DIAGNOSIS — H25811 Combined forms of age-related cataract, right eye: Secondary | ICD-10-CM | POA: Diagnosis not present

## 2021-04-03 DIAGNOSIS — H25011 Cortical age-related cataract, right eye: Secondary | ICD-10-CM | POA: Diagnosis not present

## 2021-04-03 DIAGNOSIS — H52221 Regular astigmatism, right eye: Secondary | ICD-10-CM | POA: Diagnosis not present

## 2021-04-03 DIAGNOSIS — H2511 Age-related nuclear cataract, right eye: Secondary | ICD-10-CM | POA: Diagnosis not present

## 2021-04-17 DIAGNOSIS — H2512 Age-related nuclear cataract, left eye: Secondary | ICD-10-CM | POA: Diagnosis not present

## 2021-04-17 DIAGNOSIS — H52222 Regular astigmatism, left eye: Secondary | ICD-10-CM | POA: Diagnosis not present

## 2021-04-17 DIAGNOSIS — H25042 Posterior subcapsular polar age-related cataract, left eye: Secondary | ICD-10-CM | POA: Diagnosis not present

## 2021-04-17 DIAGNOSIS — H25012 Cortical age-related cataract, left eye: Secondary | ICD-10-CM | POA: Diagnosis not present

## 2021-04-17 DIAGNOSIS — H25812 Combined forms of age-related cataract, left eye: Secondary | ICD-10-CM | POA: Diagnosis not present

## 2021-04-22 ENCOUNTER — Ambulatory Visit: Payer: Medicare HMO | Admitting: Cardiology

## 2021-04-25 ENCOUNTER — Other Ambulatory Visit: Payer: Self-pay

## 2021-04-25 ENCOUNTER — Ambulatory Visit: Payer: Medicare HMO | Admitting: Cardiology

## 2021-04-25 VITALS — BP 132/78 | HR 66 | Ht 67.0 in | Wt 184.0 lb

## 2021-04-25 DIAGNOSIS — I10 Essential (primary) hypertension: Secondary | ICD-10-CM

## 2021-04-25 DIAGNOSIS — I2510 Atherosclerotic heart disease of native coronary artery without angina pectoris: Secondary | ICD-10-CM | POA: Diagnosis not present

## 2021-04-25 DIAGNOSIS — E782 Mixed hyperlipidemia: Secondary | ICD-10-CM

## 2021-04-25 NOTE — Progress Notes (Signed)
Cardiology Office Note:    Date:  04/25/2021   ID:  Andrew Solomon, DOB 1950-11-10, MRN [id number]  PCP:  Harlan Stains, MD  Cardiologist:  Jenne Campus, MD    Referring MD: Harlan Stains, MD   Chief Complaint  Patient presents with   Follow-up  I am doing very well  History of Present Illness:    Andrew Solomon is a 70 y.o. male with past medical history significant for coronary artery disease.  He did have PTCA and stent to proximal LAD in 1998.  Last stress test done in December 2019 which was negative.  He also have history of dyslipidemia which is excellently managed as well as hypertension. He comes to my office for follow-up overall he is doing excellently.  He denies have any chest pain tightness squeezing pressure burning chest he still very active he just retired and he is looking for his retirement.  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 work accident age 83, got caught in a [place] Left    left main trunk artery   hemorroids     LAPAROSCOPIC ABDOMINAL EXPLORATION N/A 03/29/2019   Procedure: [place];  Surgeon: Jovita Kussmaul, MD;  Location: [facility];  Service: General;  Laterality: N/A;   LAPAROSCOPIC SIGMOID COLECTOMY Left 08/31/2019   Procedure: LAPAROSCOPIC SIGMOID COLECTOMY;  Surgeon: Jovita Kussmaul, MD;  Location: [facility];  Service: General;   Laterality: Left;   left inguinal hernia repair      Current Medications: Current Meds  Medication Sig   acetaminophen (TYLENOL) 325 MG tablet Take 1,000 mg by mouth every 6 (six) hours as needed for mild pain or headache.    aspirin EC 81 MG tablet Take 81 mg by mouth at bedtime.    atenolol (TENORMIN) 25 MG tablet TAKE 1 TABLET(25 MG) BY MOUTH DAILY (Patient taking differently: Take 25 mg by mouth daily.)   Cholecalciferol (VITAMIN D3) 50 MCG (2000 UT) TABS Take 4,000 Units by mouth at bedtime.   ezetimibe (ZETIA) 10 MG tablet TAKE 1 TABLET BY MOUTH EVERY DAY (Patient taking differently: Take 10 mg by mouth daily.)   moxifloxacin (VIGAMOX) 0.5 % ophthalmic solution Place 1 drop into the left eye 4 (four) times daily.   Multiple Vitamin (MULTIVITAMIN WITH MINERALS) TABS tablet Take 1 tablet by mouth daily. Centrum Silver for Adults 50+   Multiple Vitamins-Minerals (PRESERVISION AREDS 2 PO) Take 1 drop by mouth daily. Unknown strength   nitroGLYCERIN (NITROSTAT) 0.4 MG SL tablet Place 1 tablet (0.4 mg total) under the tongue every 5 (five) minutes as needed. (Patient taking differently: Place 0.4 mg under the tongue every 5 (five) minutes as needed for chest pain.)   omeprazole (PRILOSEC) 20 MG capsule Take 20 mg by mouth every other day. At night   prednisoLONE acetate (PRED FORTE) 1 %  ophthalmic suspension Place 1 drop into the left eye 4 (four) times daily.   Probiotic Product (PROBIOTIC PO) Take 1 capsule by mouth at bedtime. Unknown strength   protein supplement shake (PREMIER PROTEIN) LIQD Take 11 oz by mouth 2 (two) times daily. Unknown strenght   rosuvastatin (CRESTOR) 40 MG tablet TAKE 1 TABLET BY MOUTH EVERY MONDAY, WEDNESDAY, FRIDAY AND TAKE 1/2 TABLET ON TUESDAY, THURS, SATURDAY, SUN (Patient taking differently: Take 40 mg by mouth daily.)   selenium 200 MCG TABS tablet Take 400 mcg by mouth at bedtime.   tiZANidine (ZANAFLEX) 4 MG tablet Take 4 mg by mouth at bedtime.   traZODone  (DESYREL) 50 MG tablet Take 50 mg by mouth at bedtime.   [DISCONTINUED] buPROPion (WELLBUTRIN XL) 300 MG 24 hr tablet Take 300 mg by mouth daily.     Allergies:   Elemental sulfur, Niacin and related, and Oxycodone   Social History   Socioeconomic History   Marital status: Married    Spouse name: Not on file   Number of children: Not on file   Years of education: Not on file   Highest education level: Not on file  Occupational History   Occupation: sales  Tobacco Use   Smoking status: Former    Packs/day: 1.00    Years: 47.00    Pack years: 47.00    Types: Cigarettes    Quit date: 06/04/2015    Years since quitting: 5.8   Smokeless tobacco: Never  Vaping Use   Vaping Use: Never used  Substance and Sexual Activity   Alcohol use: Yes    Alcohol/week: 0.0 standard drinks    Comment: rare/occ   Drug use: No   Sexual activity: Not on file  Other Topics Concern   Not on file  Social History Narrative   Not on file   Social Determinants of Health   Financial Resource Strain: Not on file  Food Insecurity: Not on file  Transportation Needs: Not on file  Physical Activity: Not on file  Stress: Not on file  Social Connections: Not on file     Family History: The patient's family history includes Bladder Cancer in his mother; Heart disease in his father; Pancreatic cancer in his father. ROS:   Please see the history of present illness.    All 14 point review of systems negative except as described per history of present illness  EKGs/Labs/Other Studies Reviewed:      Recent Labs: No results found for requested labs within last 8760 hours.  Recent Lipid Panel    Component Value Date/Time   CHOL 139 11/27/2019 0945   TRIG 137 11/27/2019 0945   HDL 42 11/27/2019 0945   CHOLHDL 3.3 11/27/2019 0945   LDLCALC 73 11/27/2019 0945    Physical Exam:    VS:  BP 132/78 (BP Location: Right Arm, Patient Position: Sitting)   Pulse 66   Ht 5\' 7"  (1.702 m)   Wt 184 lb  (83.5 kg)   SpO2 98%   BMI 28.82 kg/m     Wt Readings from Last 3 Encounters:  04/25/21 184 lb (83.5 kg)  06/26/20 191 lb (86.6 kg)  11/27/19 163 lb (73.9 kg)     GEN:  Well nourished, well developed in no acute distress HEENT: Normal NECK: No JVD; No carotid bruits LYMPHATICS: No lymphadenopathy CARDIAC: RRR, no murmurs, no rubs, no gallops RESPIRATORY:  Clear to auscultation without rales, wheezing or rhonchi  ABDOMEN: Soft, non-tender, non-distended MUSCULOSKELETAL:  No edema;  No deformity  SKIN: Warm and dry LOWER EXTREMITIES: no swelling NEUROLOGIC:  Alert and oriented x 3 PSYCHIATRIC:  Normal affect   ASSESSMENT:    1. Coronary artery disease involving native coronary artery of native heart without angina pectoris   2. Essential hypertension   3. Mixed hyperlipidemia    PLAN:    In order of problems listed above:  Coronary disease stable from that point review on appropriate medication which include statin and antiplatelet therapy which I will continue. Essential hypertension blood pressure well controlled continue present management. Dyslipidemia I did review his K PN which show me LDL of 52 HDL 32 this is from 12/02/2020 excellent cholesterol profile continue present management which include Zetia and a high intense statin Crestor.   Medication Adjustments/Labs and Tests Ordered: Current medicines are reviewed at length with the patient today.  Concerns regarding medicines are outlined above.  No orders of the defined types were placed in this encounter.  Medication changes: No orders of the defined types were placed in this encounter.   Signed, Park Liter, MD, [facility] 04/25/2021 11:33 AM    [place]

## 2021-04-25 NOTE — Patient Instructions (Signed)

## 2021-04-28 ENCOUNTER — Other Ambulatory Visit: Payer: Self-pay | Admitting: Cardiology

## 2021-04-28 NOTE — Telephone Encounter (Signed)
RX sent

## 2021-05-20 DIAGNOSIS — H35352 Cystoid macular degeneration, left eye: Secondary | ICD-10-CM | POA: Diagnosis not present

## 2021-06-14 DIAGNOSIS — R051 Acute cough: Secondary | ICD-10-CM | POA: Diagnosis not present

## 2021-06-14 DIAGNOSIS — J0100 Acute maxillary sinusitis, unspecified: Secondary | ICD-10-CM | POA: Diagnosis not present

## 2021-06-20 DIAGNOSIS — H35352 Cystoid macular degeneration, left eye: Secondary | ICD-10-CM | POA: Diagnosis not present

## 2021-07-28 ENCOUNTER — Other Ambulatory Visit: Payer: Self-pay | Admitting: Cardiology

## 2021-11-17 ENCOUNTER — Ambulatory Visit (INDEPENDENT_AMBULATORY_CARE_PROVIDER_SITE_OTHER)
Admission: RE | Admit: 2021-11-17 | Discharge: 2021-11-17 | Disposition: A | Discharge status: Home / Self Care | Payer: Medicare HMO | Source: Ambulatory Visit | Attending: Acute Care | Admitting: Acute Care

## 2021-11-17 DIAGNOSIS — Z87891 Personal history of nicotine dependence: Secondary | ICD-10-CM

## 2021-11-19 ENCOUNTER — Other Ambulatory Visit: Payer: Self-pay

## 2021-11-19 DIAGNOSIS — Z122 Encounter for screening for malignant neoplasm of respiratory organs: Secondary | ICD-10-CM

## 2021-11-19 DIAGNOSIS — Z87891 Personal history of nicotine dependence: Secondary | ICD-10-CM

## 2021-12-10 DIAGNOSIS — Z85828 Personal history of other malignant neoplasm of skin: Secondary | ICD-10-CM | POA: Diagnosis not present

## 2021-12-10 DIAGNOSIS — L570 Actinic keratosis: Secondary | ICD-10-CM | POA: Diagnosis not present

## 2021-12-10 DIAGNOSIS — D0439 Carcinoma in situ of skin of other parts of face: Secondary | ICD-10-CM | POA: Diagnosis not present

## 2021-12-10 DIAGNOSIS — D485 Neoplasm of uncertain behavior of skin: Secondary | ICD-10-CM | POA: Diagnosis not present

## 2021-12-10 DIAGNOSIS — L821 Other seborrheic keratosis: Secondary | ICD-10-CM | POA: Diagnosis not present

## 2021-12-10 DIAGNOSIS — D1801 Hemangioma of skin and subcutaneous tissue: Secondary | ICD-10-CM | POA: Diagnosis not present

## 2021-12-10 DIAGNOSIS — L820 Inflamed seborrheic keratosis: Secondary | ICD-10-CM | POA: Diagnosis not present

## 2021-12-25 DIAGNOSIS — K219 Gastro-esophageal reflux disease without esophagitis: Secondary | ICD-10-CM | POA: Diagnosis not present

## 2021-12-25 DIAGNOSIS — J449 Chronic obstructive pulmonary disease, unspecified: Secondary | ICD-10-CM | POA: Diagnosis not present

## 2021-12-25 DIAGNOSIS — Z79899 Other long term (current) drug therapy: Secondary | ICD-10-CM | POA: Diagnosis not present

## 2021-12-25 DIAGNOSIS — M545 Low back pain, unspecified: Secondary | ICD-10-CM | POA: Diagnosis not present

## 2021-12-25 DIAGNOSIS — E785 Hyperlipidemia, unspecified: Secondary | ICD-10-CM | POA: Diagnosis not present

## 2021-12-25 DIAGNOSIS — R69 Illness, unspecified: Secondary | ICD-10-CM | POA: Diagnosis not present

## 2021-12-25 DIAGNOSIS — Z23 Encounter for immunization: Secondary | ICD-10-CM | POA: Diagnosis not present

## 2021-12-25 DIAGNOSIS — Z125 Encounter for screening for malignant neoplasm of prostate: Secondary | ICD-10-CM | POA: Diagnosis not present

## 2021-12-25 DIAGNOSIS — I25811 Atherosclerosis of native coronary artery of transplanted heart without angina pectoris: Secondary | ICD-10-CM | POA: Diagnosis not present

## 2022-01-23 ENCOUNTER — Other Ambulatory Visit: Payer: Self-pay | Admitting: Cardiology

## 2022-03-13 ENCOUNTER — Other Ambulatory Visit: Payer: Self-pay | Admitting: Cardiology

## 2022-03-13 NOTE — Telephone Encounter (Signed)
Rx refill sent to pharmacy. 

## 2022-03-30 DIAGNOSIS — L570 Actinic keratosis: Secondary | ICD-10-CM | POA: Diagnosis not present

## 2022-04-16 DIAGNOSIS — H3530 Unspecified macular degeneration: Secondary | ICD-10-CM | POA: Diagnosis not present

## 2022-04-16 DIAGNOSIS — E669 Obesity, unspecified: Secondary | ICD-10-CM | POA: Diagnosis not present

## 2022-04-16 DIAGNOSIS — Z6828 Body mass index (BMI) 28.0-28.9, adult: Secondary | ICD-10-CM | POA: Diagnosis not present

## 2022-04-16 DIAGNOSIS — K219 Gastro-esophageal reflux disease without esophagitis: Secondary | ICD-10-CM | POA: Diagnosis not present

## 2022-04-16 DIAGNOSIS — M1990 Unspecified osteoarthritis, unspecified site: Secondary | ICD-10-CM | POA: Diagnosis not present

## 2022-04-16 DIAGNOSIS — D509 Iron deficiency anemia, unspecified: Secondary | ICD-10-CM | POA: Diagnosis not present

## 2022-04-16 DIAGNOSIS — I2510 Atherosclerotic heart disease of native coronary artery without angina pectoris: Secondary | ICD-10-CM | POA: Diagnosis not present

## 2022-04-16 DIAGNOSIS — N529 Male erectile dysfunction, unspecified: Secondary | ICD-10-CM | POA: Diagnosis not present

## 2022-04-16 DIAGNOSIS — G4700 Insomnia, unspecified: Secondary | ICD-10-CM | POA: Diagnosis not present

## 2022-04-16 DIAGNOSIS — Z008 Encounter for other general examination: Secondary | ICD-10-CM | POA: Diagnosis not present

## 2022-04-16 DIAGNOSIS — I10 Essential (primary) hypertension: Secondary | ICD-10-CM | POA: Diagnosis not present

## 2022-04-16 DIAGNOSIS — I739 Peripheral vascular disease, unspecified: Secondary | ICD-10-CM | POA: Diagnosis not present

## 2022-04-16 DIAGNOSIS — E785 Hyperlipidemia, unspecified: Secondary | ICD-10-CM | POA: Diagnosis not present

## 2022-04-23 ENCOUNTER — Other Ambulatory Visit: Payer: Self-pay | Admitting: Cardiology

## 2022-06-01 DIAGNOSIS — Z800 Family history of malignant neoplasm of digestive organs: Secondary | ICD-10-CM | POA: Insufficient documentation

## 2022-06-01 DIAGNOSIS — R634 Abnormal weight loss: Secondary | ICD-10-CM

## 2022-06-01 DIAGNOSIS — Z1211 Encounter for screening for malignant neoplasm of colon: Secondary | ICD-10-CM | POA: Insufficient documentation

## 2022-06-01 DIAGNOSIS — K219 Gastro-esophageal reflux disease without esophagitis: Secondary | ICD-10-CM | POA: Insufficient documentation

## 2022-06-01 DIAGNOSIS — K5730 Diverticulosis of large intestine without perforation or abscess without bleeding: Secondary | ICD-10-CM | POA: Insufficient documentation

## 2022-06-01 HISTORY — DX: Family history of malignant neoplasm of digestive organs: Z80.0

## 2022-06-01 HISTORY — DX: Encounter for screening for malignant neoplasm of colon: Z12.11

## 2022-06-01 HISTORY — DX: Abnormal weight loss: R63.4

## 2022-06-16 ENCOUNTER — Ambulatory Visit: Payer: Medicare HMO | Attending: Cardiology | Admitting: Cardiology

## 2022-06-16 ENCOUNTER — Encounter: Payer: Self-pay | Admitting: Cardiology

## 2022-06-16 VITALS — BP 110/66 | HR 70 | Ht 67.0 in | Wt 186.0 lb

## 2022-06-16 DIAGNOSIS — I2510 Atherosclerotic heart disease of native coronary artery without angina pectoris: Secondary | ICD-10-CM | POA: Diagnosis not present

## 2022-06-16 DIAGNOSIS — E785 Hyperlipidemia, unspecified: Secondary | ICD-10-CM | POA: Diagnosis not present

## 2022-06-16 DIAGNOSIS — I10 Essential (primary) hypertension: Secondary | ICD-10-CM

## 2022-06-16 DIAGNOSIS — Z79899 Other long term (current) drug therapy: Secondary | ICD-10-CM

## 2022-06-16 MED ORDER — ATENOLOL 25 MG PO TABS: 25.0000 mg | ORAL_TABLET | Freq: Every day | ORAL | 3 refills | Status: DC

## 2022-06-16 MED ORDER — ROSUVASTATIN CALCIUM 40 MG PO TABS: ORAL_TABLET | ORAL | 2 refills | Status: DC

## 2022-06-16 MED ORDER — EZETIMIBE 10 MG PO TABS: 10.0000 mg | ORAL_TABLET | Freq: Every day | ORAL | 3 refills | Status: DC

## 2022-06-16 MED ORDER — ATENOLOL 25 MG PO TABS: 25.0000 mg | ORAL_TABLET | Freq: Every day | ORAL | 1 refills | Status: DC

## 2022-06-16 MED ORDER — NITROGLYCERIN 0.4 MG SL SUBL: 0.4000 mg | SUBLINGUAL_TABLET | SUBLINGUAL | 6 refills | Status: AC | PRN

## 2022-06-16 MED ORDER — ROSUVASTATIN CALCIUM 40 MG PO TABS: ORAL_TABLET | ORAL | 3 refills | Status: DC

## 2022-06-16 NOTE — Progress Notes (Unsigned)
Cardiology Office Note:    Date:  06/16/2022   ID:  Andrew Solomon, DOB 11-Aug-1950, MRN [id number]  PCP:  Harlan Stains, MD  Cardiologist:  Jenne Campus, MD    Referring MD: Harlan Stains, MD   Chief Complaint  Patient presents with   Follow-up  Doing fine cardiac wise  History of Present Illness:    Andrew Solomon is a 72 y.o. male with coronary disease, status post PTCA and stenting done in the proximal LAD 1998, last stress test done in December 2019 which was negative, dyslipidemia, essential hypertension. Comes today to my office for follow-up.  Cardiac wise doing well denies of any chest pain tightness squeezing pressure burning chest.  He is a retired he had to retire because he have to take care of his wife who apparently does have some dementia he is very troubled by diet and is very busy taking care of her 24/7.   Past Medical History:  Diagnosis Date   Abnormal weight loss 06/01/2022   Arthritis    Cancer ([place])    skin cancer   Colon cancer screening 06/01/2022   Coronary artery disease PTCA and stents to proximal LAD in 1998, last stress test December 2018- 08/05/2018   Diverticulitis large intestine 08/31/2019   Diverticulitis of large intestine with perforation 03/20/2019   Dyslipidemia 08/05/2018   Essential hypertension 08/05/2018   Family history of malignant neoplasm of digestive organs 06/01/2022   Heart disease    HTN (hypertension)    Hyperlipidemia    Ileus ([place]) 03/29/2019   Leukocytosis 03/29/2019   Pneumonia     Past Surgical History:  Procedure Laterality Date   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 work accident age 55, got caught in a [place] Left    left main trunk artery   hemorroids     LAPAROSCOPIC ABDOMINAL EXPLORATION N/A 03/29/2019   Procedure: [place];  Surgeon: Jovita Kussmaul, MD;  Location: [facility];  Service: General;  Laterality: N/A;    LAPAROSCOPIC SIGMOID COLECTOMY Left 08/31/2019   Procedure: LAPAROSCOPIC SIGMOID COLECTOMY;  Surgeon: Jovita Kussmaul, MD;  Location: [facility];  Service: General;  Laterality: Left;   left inguinal hernia repair      Current Medications: Current Meds  Medication Sig   acetaminophen (TYLENOL) 325 MG tablet Take 1,000 mg by mouth every 6 (six) hours as needed for mild pain or headache.    aspirin EC 81 MG tablet Take 81 mg by mouth at bedtime.    atenolol (TENORMIN) 25 MG tablet Take 1 tablet (25 mg total) by mouth daily.   Cholecalciferol (VITAMIN D3) 50 MCG (2000 UT) TABS Take 4,000 Units by mouth at bedtime.   moxifloxacin (VIGAMOX) 0.5 % ophthalmic solution Place 1 drop into the left eye 4 (four) times daily.   Multiple Vitamin (MULTIVITAMIN WITH MINERALS) TABS tablet Take 1 tablet by mouth daily. Centrum Silver for Adults 50+   Multiple Vitamins-Minerals (PRESERVISION AREDS 2 PO) Take 1 drop by mouth daily. Unknown strength   omeprazole (PRILOSEC) 20 MG capsule Take 20 mg by mouth every other day. At night   prednisoLONE acetate (PRED FORTE) 1 % ophthalmic suspension Place 1 drop into the left eye 4 (four) times daily.   Probiotic Product (PROBIOTIC PO) Take 1 capsule by mouth at bedtime. Unknown strength   protein supplement shake (PREMIER PROTEIN) LIQD Take 11 oz by mouth 2 (two) times daily.  Unknown strenght   rosuvastatin (CRESTOR) 40 MG tablet Take 1  tablet by mouth Monday, Wednesday and Friday's. 1/2 tablet Tuesday, Thursday, Saturday and Sunday   selenium 200 MCG TABS tablet Take 400 mcg by mouth at bedtime.   tiZANidine (ZANAFLEX) 4 MG tablet Take 4 mg by mouth at bedtime.   traZODone (DESYREL) 50 MG tablet Take 50 mg by mouth at bedtime.   [DISCONTINUED] atenolol (TENORMIN) 25 MG tablet TAKE 1 TABLET(25 MG) BY MOUTH DAILY (Patient taking differently: Take 25 mg by mouth daily.)   [DISCONTINUED] ezetimibe (ZETIA) 10 MG tablet TAKE 1 TABLET BY MOUTH EVERY DAY (Patient taking  differently: Take 10 mg by mouth daily.)   [DISCONTINUED] nitroGLYCERIN (NITROSTAT) 0.4 MG SL tablet Place 1 tablet (0.4 mg total) under the tongue every 5 (five) minutes as needed. (Patient taking differently: Place 0.4 mg under the tongue every 5 (five) minutes as needed for chest pain.)   [DISCONTINUED] rosuvastatin (CRESTOR) 40 MG tablet TAKE 1 TABLET MONDAY, WEDNESDAY, FRIDAY, AND 1/2 TABLET TUES, THURS, SATURDAY, AND SUNDAY (Patient taking differently: Take 40 mg by mouth See admin instructions. 1 tab M,W,F and 1/2 tab T,Thur, Sat and Sun)     Allergies:   Elemental sulfur, Niacin and related, and Oxycodone   Social History   Socioeconomic History   Marital status: Married    Spouse name: Not on file   Number of children: Not on file   Years of education: Not on file   Highest education level: Not on file  Occupational History   Occupation: sales  Tobacco Use   Smoking status: Former    Packs/day: 1.00    Years: 47.00    Total pack years: 47.00    Types: Cigarettes    Quit date: 06/04/2015    Years since quitting: 7.0   Smokeless tobacco: Never  Vaping Use   Vaping Use: Never used  Substance and Sexual Activity   Alcohol use: Yes    Alcohol/week: 0.0 standard drinks of alcohol    Comment: rare/occ   Drug use: No   Sexual activity: Not on file  Other Topics Concern   Not on file  Social History Narrative   Not on file   Social Determinants of Health   Financial Resource Strain: Not on file  Food Insecurity: Not on file  Transportation Needs: Not on file  Physical Activity: Not on file  Stress: Not on file  Social Connections: Not on file     Family History: The patient's family history includes Bladder Cancer in his mother; Heart disease in his father; Pancreatic cancer in his father. ROS:   Please see the history of present illness.    All 14 point review of systems negative except as described per history of present illness  EKGs/Labs/Other Studies  Reviewed:      Recent Labs: No results found for requested labs within last 365 days.  Recent Lipid Panel    Component Value Date/Time   CHOL 139 11/27/2019 0945   TRIG 137 11/27/2019 0945   HDL 42 11/27/2019 0945   CHOLHDL 3.3 11/27/2019 0945   LDLCALC 73 11/27/2019 0945    Physical Exam:    VS:  BP 110/66 (BP Location: Left Arm, Patient Position: Sitting)   Pulse 70   Ht '5\' 7"'$  (1.702 m)   Wt 186 lb 0.6 oz (84.4 kg)   SpO2 96%   BMI 29.14 kg/m     Wt Readings from Last 3 Encounters:  06/16/22 186 lb  0.6 oz (84.4 kg)  04/25/21 184 lb (83.5 kg)  06/26/20 191 lb (86.6 kg)     GEN:  Well nourished, well developed in no acute distress HEENT: Normal NECK: No JVD; No carotid bruits LYMPHATICS: No lymphadenopathy CARDIAC: RRR, no murmurs, no rubs, no gallops RESPIRATORY:  Clear to auscultation without rales, wheezing or rhonchi  ABDOMEN: Soft, non-tender, non-distended MUSCULOSKELETAL:  No edema; No deformity  SKIN: Warm and dry LOWER EXTREMITIES: no swelling NEUROLOGIC:  Alert and oriented x 3 PSYCHIATRIC:  Normal affect   ASSESSMENT:    1. Medication management   2. Coronary artery disease involving native coronary artery of native heart without angina pectoris   3. Essential hypertension   4. Dyslipidemia    PLAN:    In order of problems listed above:  Coronary artery disease stable from that point review.  Will continue present management. Essential high tension blood pressure well-controlled continue present management. Dyslipidemia I did review his K PN which show me his LDL of 62 HDL 36.  Good cholesterol management continue present management triglycerides are high he promised to BL be more active and eat less carbohydrate Will repeat his fasting lipid profile in 3 months   Medication Adjustments/Labs and Tests Ordered: Current medicines are reviewed at length with the patient today.  Concerns regarding medicines are outlined above.  Orders Placed This  Encounter  Procedures   EKG 12-Lead   Medication changes:  Meds ordered this encounter  Medications   DISCONTD: atenolol (TENORMIN) 25 MG tablet    Sig: Take 1 tablet (25 mg total) by mouth daily. TAKE 1 TABLET(25 MG) BY MOUTH DAILY Strength: 25 mg    Dispense:  90 tablet    Refill:  1   ezetimibe (ZETIA) 10 MG tablet    Sig: Take 1 tablet (10 mg total) by mouth daily.    Dispense:  90 tablet    Refill:  3   nitroGLYCERIN (NITROSTAT) 0.4 MG SL tablet    Sig: Place 1 tablet (0.4 mg total) under the tongue every 5 (five) minutes as needed for chest pain (1 dose every 5 minutes.DO NOT EXCEED 3 DOSES. If chest pain not resolved after 3rd dose CALL 911).    Dispense:  25 tablet    Refill:  6   DISCONTD: rosuvastatin (CRESTOR) 40 MG tablet    Sig: TAKE 1 TABLET MONDAY, WEDNESDAY, FRIDAY, AND 1/2 TABLET TUES, THURS, SATURDAY, AND SUNDAY Strength: 40 mg    Dispense:  60 tablet    Refill:  2    **Patient requests 90 days supply**   rosuvastatin (CRESTOR) 40 MG tablet    Sig: Take 1  tablet by mouth Monday, Wednesday and Friday's. 1/2 tablet Tuesday, Thursday, Saturday and Sunday    Dispense:  60 tablet    Refill:  3   atenolol (TENORMIN) 25 MG tablet    Sig: Take 1 tablet (25 mg total) by mouth daily.    Dispense:  90 tablet    Refill:  3    Signed, Park Liter, MD, [facility] 06/16/2022 3:43 PM    [place]

## 2022-06-16 NOTE — Patient Instructions (Addendum)
Medication Instructions:  Your physician recommends that you continue on your current medications as directed. Please refer to the Current Medication list given to you today.  *If you need a refill on your cardiac medications before your next appointment, please call your pharmacy*   Lab Work: 2nd [place] [id number] Your physician recommends that you return for lab work in: 3 months You need to have labs done when you are fasting.  You can come Monday through Friday 8:00 am to 11:30 am and 1:00 to 4:30. -NO AFTERNOON HOURS ON FRIDAY- You do not need to make an appointment as the order has already been placed.   Testing/Procedures: None Ordered   Follow-Up: At [facility], you and your health needs are our priority.  As part of our continuing mission to provide you with exceptional heart care, we have created designated Provider Care Teams.  These Care Teams include your primary Cardiologist (physician) and Advanced Practice Providers (APPs -  Physician Assistants and Nurse Practitioners) who all work together to provide you with the care you need, when you need it.  We recommend signing up for the patient portal called "MyChart".  Sign up information is provided on this After Visit Summary.  MyChart is used to connect with patients for Virtual Visits (Telemedicine).  Patients are able to view lab/test results, encounter notes, upcoming appointments, etc.  Non-urgent messages can be sent to your provider as well.   To learn more about what you can do with MyChart, go to [email or web address].    Your next appointment:   12 month(s)  The format for your next appointment:   In Person  Provider:   Jenne Campus, MD    Other Instructions NA

## 2022-06-24 DIAGNOSIS — Z961 Presence of intraocular lens: Secondary | ICD-10-CM | POA: Diagnosis not present

## 2022-06-24 DIAGNOSIS — H35372 Puckering of macula, left eye: Secondary | ICD-10-CM | POA: Diagnosis not present

## 2022-06-24 DIAGNOSIS — H524 Presbyopia: Secondary | ICD-10-CM | POA: Diagnosis not present

## 2022-06-24 DIAGNOSIS — H353132 Nonexudative age-related macular degeneration, bilateral, intermediate dry stage: Secondary | ICD-10-CM | POA: Diagnosis not present

## 2022-08-05 DIAGNOSIS — L570 Actinic keratosis: Secondary | ICD-10-CM | POA: Diagnosis not present

## 2022-11-19 ENCOUNTER — Ambulatory Visit (HOSPITAL_COMMUNITY)
Admission: RE | Admit: 2022-11-19 | Discharge: 2022-11-19 | Disposition: A | Discharge status: Home / Self Care | Payer: Medicare HMO | Source: Ambulatory Visit | Attending: Acute Care | Admitting: Acute Care

## 2022-11-19 DIAGNOSIS — I2510 Atherosclerotic heart disease of native coronary artery without angina pectoris: Secondary | ICD-10-CM | POA: Insufficient documentation

## 2022-11-19 DIAGNOSIS — J432 Centrilobular emphysema: Secondary | ICD-10-CM | POA: Insufficient documentation

## 2022-11-19 DIAGNOSIS — I700 Atherosclerosis of aorta: Secondary | ICD-10-CM | POA: Diagnosis not present

## 2022-11-19 DIAGNOSIS — Z122 Encounter for screening for malignant neoplasm of respiratory organs: Secondary | ICD-10-CM

## 2022-11-19 DIAGNOSIS — Z87891 Personal history of nicotine dependence: Secondary | ICD-10-CM | POA: Diagnosis not present

## 2022-11-27 ENCOUNTER — Other Ambulatory Visit: Payer: Self-pay

## 2022-11-27 DIAGNOSIS — Z122 Encounter for screening for malignant neoplasm of respiratory organs: Secondary | ICD-10-CM

## 2022-11-27 DIAGNOSIS — Z87891 Personal history of nicotine dependence: Secondary | ICD-10-CM

## 2022-12-16 DIAGNOSIS — D1801 Hemangioma of skin and subcutaneous tissue: Secondary | ICD-10-CM | POA: Diagnosis not present

## 2022-12-16 DIAGNOSIS — L821 Other seborrheic keratosis: Secondary | ICD-10-CM | POA: Diagnosis not present

## 2022-12-16 DIAGNOSIS — Z85828 Personal history of other malignant neoplasm of skin: Secondary | ICD-10-CM | POA: Diagnosis not present

## 2022-12-28 DIAGNOSIS — I25811 Atherosclerosis of native coronary artery of transplanted heart without angina pectoris: Secondary | ICD-10-CM | POA: Diagnosis not present

## 2022-12-28 DIAGNOSIS — Z0000 Encounter for general adult medical examination without abnormal findings: Secondary | ICD-10-CM | POA: Diagnosis not present

## 2022-12-28 DIAGNOSIS — J449 Chronic obstructive pulmonary disease, unspecified: Secondary | ICD-10-CM | POA: Diagnosis not present

## 2022-12-28 DIAGNOSIS — Z125 Encounter for screening for malignant neoplasm of prostate: Secondary | ICD-10-CM | POA: Diagnosis not present

## 2022-12-28 DIAGNOSIS — I700 Atherosclerosis of aorta: Secondary | ICD-10-CM | POA: Diagnosis not present

## 2022-12-28 DIAGNOSIS — F5101 Primary insomnia: Secondary | ICD-10-CM | POA: Diagnosis not present

## 2022-12-28 DIAGNOSIS — F419 Anxiety disorder, unspecified: Secondary | ICD-10-CM | POA: Diagnosis not present

## 2022-12-28 DIAGNOSIS — E291 Testicular hypofunction: Secondary | ICD-10-CM | POA: Diagnosis not present

## 2022-12-28 DIAGNOSIS — Z79899 Other long term (current) drug therapy: Secondary | ICD-10-CM | POA: Diagnosis not present

## 2022-12-28 DIAGNOSIS — K219 Gastro-esophageal reflux disease without esophagitis: Secondary | ICD-10-CM | POA: Diagnosis not present

## 2022-12-28 DIAGNOSIS — E785 Hyperlipidemia, unspecified: Secondary | ICD-10-CM | POA: Diagnosis not present

## 2023-04-04 ENCOUNTER — Other Ambulatory Visit: Payer: Self-pay | Admitting: Cardiology

## 2023-06-14 ENCOUNTER — Other Ambulatory Visit: Payer: Self-pay | Admitting: Cardiology

## 2023-06-22 DIAGNOSIS — H524 Presbyopia: Secondary | ICD-10-CM | POA: Diagnosis not present

## 2023-06-22 DIAGNOSIS — H52201 Unspecified astigmatism, right eye: Secondary | ICD-10-CM | POA: Diagnosis not present

## 2023-06-22 DIAGNOSIS — H353132 Nonexudative age-related macular degeneration, bilateral, intermediate dry stage: Secondary | ICD-10-CM | POA: Diagnosis not present

## 2023-06-22 DIAGNOSIS — H35372 Puckering of macula, left eye: Secondary | ICD-10-CM | POA: Diagnosis not present

## 2023-06-22 DIAGNOSIS — H26491 Other secondary cataract, right eye: Secondary | ICD-10-CM | POA: Diagnosis not present

## 2023-07-05 ENCOUNTER — Other Ambulatory Visit: Payer: Self-pay | Admitting: Cardiology

## 2023-07-29 ENCOUNTER — Other Ambulatory Visit: Payer: Self-pay | Admitting: Cardiology

## 2023-08-26 ENCOUNTER — Other Ambulatory Visit: Payer: Self-pay | Admitting: Cardiology

## 2023-10-18 ENCOUNTER — Other Ambulatory Visit: Payer: Self-pay | Admitting: Acute Care

## 2023-10-18 DIAGNOSIS — Z122 Encounter for screening for malignant neoplasm of respiratory organs: Secondary | ICD-10-CM

## 2023-10-18 DIAGNOSIS — Z87891 Personal history of nicotine dependence: Secondary | ICD-10-CM

## 2023-10-21 DIAGNOSIS — Z800 Family history of malignant neoplasm of digestive organs: Secondary | ICD-10-CM | POA: Insufficient documentation

## 2023-10-25 ENCOUNTER — Ambulatory Visit: Attending: Cardiology | Admitting: Cardiology

## 2023-10-25 VITALS — BP 124/60 | HR 48 | Ht 65.0 in | Wt 188.0 lb

## 2023-10-25 DIAGNOSIS — I2510 Atherosclerotic heart disease of native coronary artery without angina pectoris: Secondary | ICD-10-CM | POA: Diagnosis not present

## 2023-10-25 DIAGNOSIS — E785 Hyperlipidemia, unspecified: Secondary | ICD-10-CM

## 2023-10-25 DIAGNOSIS — I10 Essential (primary) hypertension: Secondary | ICD-10-CM | POA: Diagnosis not present

## 2023-10-25 NOTE — Addendum Note (Signed)
 Addended by: Aurelio Leer I on: 10/25/2023 03:07 PM   Modules accepted: Orders

## 2023-10-25 NOTE — Patient Instructions (Signed)
 Medication Instructions:  Your physician recommends that you continue on your current medications as directed. Please refer to the Current Medication list given to you today.  *If you need a refill on your cardiac medications before your next appointment, please call your pharmacy*  Lab Work: None If you have labs (blood work) drawn today and your tests are completely normal, you will receive your results only by: MyChart Message (if you have MyChart) OR A paper copy in the mail If you have any lab test that is abnormal or we need to change your treatment, we will call you to review the results.  Testing/Procedures: Your physician has requested that you have an echocardiogram. Echocardiography is a painless test that uses sound waves to create images of your heart. It provides your doctor with information about the size and shape of your heart and how well your heart's chambers and valves are working. This procedure takes approximately one hour. There are no restrictions for this procedure. Please do NOT wear cologne, perfume, aftershave, or lotions (deodorant is allowed). Please arrive 15 minutes prior to your appointment time.  Please note: We ask at that you not bring children with you during ultrasound (echo/ vascular) testing. Due to room size and safety concerns, children are not allowed in the ultrasound rooms during exams. Our front office staff cannot provide observation of children in our lobby area while testing is being conducted. An adult accompanying a patient to their appointment will only be allowed in the ultrasound room at the discretion of the ultrasound technician under special circumstances. We apologize for any inconvenience.   Follow-Up: At [facility], you and your health needs are our priority.  As part of our continuing mission to provide you with exceptional heart care, our providers are all part of one team.  This team includes your primary Cardiologist  (physician) and Advanced Practice Providers or APPs (Physician Assistants and Nurse Practitioners) who all work together to provide you with the care you need, when you need it.  Your next appointment:   1 year(s)  Provider:   Ralene Burger, MD    We recommend signing up for the patient portal called "MyChart".  Sign up information is provided on this After Visit Summary.  MyChart is used to connect with patients for Virtual Visits (Telemedicine).  Patients are able to view lab/test results, encounter notes, upcoming appointments, etc.  Non-urgent messages can be sent to your provider as well.   To learn more about what you can do with MyChart, go to [email or web address].   Other Instructions None

## 2023-10-25 NOTE — Progress Notes (Signed)
 Cardiology Office Note:    Date:  10/25/2023   ID:  Andrew Solomon, DOB 04/10/1951, MRN [id number]  PCP:  Victorio Grave, MD  Cardiologist:  Ralene Burger, MD    Referring MD: Victorio Grave, MD   Chief Complaint  Patient presents with   Follow-up    History of Present Illness:    Andrew Solomon is a 73 y.o. male history significant for coronary artery disease status post PTCA and stenting done in proximal LAD in 1998, last evaluation for CAD was done in December 2019 which was negative, dyslipidemia, essential hypertension.  Comes today to months for follow-up like always he tells me that he take care of his wife he does have a lot of work doing this.  He denies having any chest pain tightness squeezing pressure burning chest but shortness of breath is still there.  Past Medical History:  Diagnosis Date   Abnormal weight loss 06/01/2022   Arthritis    Cancer (HCC)    skin cancer   Colon cancer screening 06/01/2022   Coronary artery disease PTCA and stents to proximal LAD in 1998, last stress test December 2018- 08/05/2018   Diverticulitis large intestine 08/31/2019   Diverticulitis of large intestine with perforation 03/20/2019   Dyslipidemia 08/05/2018   Essential hypertension 08/05/2018   Family history of malignant neoplasm of digestive organs 06/01/2022   Heart disease    HTN (hypertension)    Hyperlipidemia    Ileus (HCC) 03/29/2019   Leukocytosis 03/29/2019   Pneumonia     Past Surgical History:  Procedure Laterality Date   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 work accident age 88, got caught in a carding machine   CORONARY STENT PLACEMENT Left    left main trunk artery   hemorroids     LAPAROSCOPIC ABDOMINAL EXPLORATION N/A 03/29/2019   Procedure: LAPAROSCOPIC DRAINAGE INTRA-ABDOMINAL ABSCESS;  Surgeon: Lillette Reid III, MD;  Location: [facility];  Service: General;  Laterality: N/A;   LAPAROSCOPIC SIGMOID COLECTOMY Left 08/31/2019   Procedure:  LAPAROSCOPIC SIGMOID COLECTOMY;  Surgeon: Caralyn Chandler, MD;  Location: [facility];  Service: General;  Laterality: Left;   left inguinal hernia repair      Current Medications: Current Meds  Medication Sig   acetaminophen  (TYLENOL ) 325 MG tablet Take 1,000 mg by mouth every 6 (six) hours as needed for mild pain or headache.    aspirin  EC 81 MG tablet Take 81 mg by mouth at bedtime.    atenolol  (TENORMIN ) 25 MG tablet Take 1 tablet (25 mg total) by mouth 2 (two) times daily.   Cholecalciferol (VITAMIN D3) 50 MCG (2000 UT) TABS Take 4,000 Units by mouth at bedtime.   ezetimibe  (ZETIA ) 10 MG tablet TAKE 1 TABLET BY MOUTH EVERY DAY   moxifloxacin (VIGAMOX) 0.5 % ophthalmic solution Place 1 drop into the left eye 4 (four) times daily.   Multiple Vitamin (MULTIVITAMIN WITH MINERALS) TABS tablet Take 1 tablet by mouth daily. Centrum Silver for Adults 50+   Multiple Vitamins-Minerals (PRESERVISION AREDS 2 PO) Take 1 drop by mouth daily. Unknown strength   nitroGLYCERIN  (NITROSTAT ) 0.4 MG SL tablet Place 1 tablet (0.4 mg total) under the tongue every 5 (five) minutes as needed for chest pain (1 dose every 5 minutes.DO NOT EXCEED 3 DOSES. If chest pain not resolved after 3rd dose CALL 911).   omeprazole (PRILOSEC) 20 MG capsule Take 20 mg by mouth every other day. At night   prednisoLONE acetate (  PRED FORTE) 1 % ophthalmic suspension Place 1 drop into the left eye 4 (four) times daily.   Probiotic Product (PROBIOTIC PO) Take 1 capsule by mouth at bedtime. Unknown strength   protein supplement shake (PREMIER PROTEIN) LIQD Take 11 oz by mouth 2 (two) times daily. Unknown strenght   rosuvastatin  (CRESTOR ) 40 MG tablet TAKE 1 TABLET BY MOUTH MONDAY, WEDNESDAY AND FRIDAY'S. 1/2 TABLET TUESDAY, THURSDAY, SATURDAY AND SUNDAY (Patient taking differently: Take 40 mg by mouth daily. Take 1  tablet by mouth Monday, Wednesday and Friday's. 1/2 tablet Tuesday, Thursday, Saturday and Sunday)   selenium 200 MCG TABS  tablet Take 400 mcg by mouth at bedtime.   tiZANidine (ZANAFLEX) 4 MG tablet Take 4 mg by mouth at bedtime.   traZODone  (DESYREL ) 50 MG tablet Take 50 mg by mouth at bedtime.     Allergies:   Elemental sulfur, Niacin and related, Oxycodone, and Sulfa antibiotics   Social History   Socioeconomic History   Marital status: Married    Spouse name: Not on file   Number of children: Not on file   Years of education: Not on file   Highest education level: Not on file  Occupational History   Occupation: sales  Tobacco Use   Smoking status: Former    Current packs/day: 0.00    Average packs/day: 1 pack/day for 47.0 years (47.0 ttl pk-yrs)    Types: Cigarettes    Start date: 06/03/1968    Quit date: 06/04/2015    Years since quitting: 8.3   Smokeless tobacco: Never  Vaping Use   Vaping status: Never Used  Substance and Sexual Activity   Alcohol use: Yes    Alcohol/week: 0.0 standard drinks of alcohol    Comment: rare/occ   Drug use: No   Sexual activity: Not on file  Other Topics Concern   Not on file  Social History Narrative   Not on file   Social Drivers of Health   Financial Resource Strain: Not on file  Food Insecurity: Not on file  Transportation Needs: Not on file  Physical Activity: Not on file  Stress: Not on file  Social Connections: Not on file     Family History: The patient's family history includes Bladder Cancer in his mother; Heart disease in his father; Pancreatic cancer in his father. ROS:   Please see the history of present illness.    All 14 point review of systems negative except as described per history of present illness  EKGs/Labs/Other Studies Reviewed:    EKG Interpretation Date/Time:  Monday Oct 25 2023 14:21:26 EDT Ventricular Rate:  71 PR Interval:  158 QRS Duration:  78 QT Interval:  408 QTC Calculation: 443 R Axis:   38  Text Interpretation: Normal sinus rhythm Normal ECG No previous ECGs available Confirmed by Ralene Burger  [phone]) on 10/25/2023 2:37:04 PM    Recent Labs: No results found for requested labs within last 365 days.  Recent Lipid Panel    Component Value Date/Time   CHOL 139 11/27/2019 0945   TRIG 137 11/27/2019 0945   HDL 42 11/27/2019 0945   CHOLHDL 3.3 11/27/2019 0945   LDLCALC 73 11/27/2019 0945    Physical Exam:    VS:  BP 124/60 (BP Location: Right Arm, Patient Position: Sitting)   Pulse (!) 48   Ht 5\' 5"  (1.651 m)   Wt 188 lb (85.3 kg)   SpO2 94%   BMI 31.28 kg/m     Wt Readings from Last  3 Encounters:  10/25/23 188 lb (85.3 kg)  06/16/22 186 lb 0.6 oz (84.4 kg)  04/25/21 184 lb (83.5 kg)     GEN:  Well nourished, well developed in no acute distress HEENT: Normal NECK: No JVD; No carotid bruits LYMPHATICS: No lymphadenopathy CARDIAC: RRR, no murmurs, no rubs, no gallops RESPIRATORY:  Clear to auscultation without rales, wheezing or rhonchi  ABDOMEN: Soft, non-tender, non-distended MUSCULOSKELETAL:  No edema; No deformity  SKIN: Warm and dry LOWER EXTREMITIES: no swelling NEUROLOGIC:  Alert and oriented x 3 PSYCHIATRIC:  Normal affect   ASSESSMENT:    1. Essential hypertension   2. Coronary artery disease involving native coronary artery of native heart without angina pectoris   3. Primary hypertension   4. Dyslipidemia    PLAN:    In order of problems listed above:  Essential hypertension blood pressure well-controlled continue present management. Coronary artery disease does not have any signs or symptoms that would indicate reactivation of the problem. Dyslipidemia I did review K PN which show me his LDL of 56 HDL 35, good cholesterol control continue present management.  In the future When he got blood test done we will need to check LP(a). Dyspnea on exertion which is new complaint.   Medication Adjustments/Labs and Tests Ordered: Current medicines are reviewed at length with the patient today.  Concerns regarding medicines are outlined above.   Orders Placed This Encounter  Procedures   EKG 12-Lead   Medication changes: No orders of the defined types were placed in this encounter.   Signed, Manfred Seed, MD, [facility] 10/25/2023 2:52 PM    [facility]

## 2023-11-13 DIAGNOSIS — I25811 Atherosclerosis of native coronary artery of transplanted heart without angina pectoris: Secondary | ICD-10-CM | POA: Diagnosis not present

## 2023-11-13 DIAGNOSIS — E785 Hyperlipidemia, unspecified: Secondary | ICD-10-CM | POA: Diagnosis not present

## 2023-11-13 DIAGNOSIS — J449 Chronic obstructive pulmonary disease, unspecified: Secondary | ICD-10-CM | POA: Diagnosis not present

## 2023-11-17 ENCOUNTER — Other Ambulatory Visit: Payer: Self-pay | Admitting: Cardiology

## 2023-11-22 ENCOUNTER — Other Ambulatory Visit

## 2023-11-22 ENCOUNTER — Other Ambulatory Visit: Payer: Self-pay | Admitting: Cardiology

## 2023-11-22 DIAGNOSIS — I10 Essential (primary) hypertension: Secondary | ICD-10-CM

## 2023-11-22 DIAGNOSIS — E785 Hyperlipidemia, unspecified: Secondary | ICD-10-CM

## 2023-11-22 DIAGNOSIS — I2510 Atherosclerotic heart disease of native coronary artery without angina pectoris: Secondary | ICD-10-CM

## 2023-11-30 ENCOUNTER — Ambulatory Visit (HOSPITAL_BASED_OUTPATIENT_CLINIC_OR_DEPARTMENT_OTHER)
Admission: RE | Admit: 2023-11-30 | Discharge: 2023-11-30 | Disposition: A | Discharge status: Home / Self Care | Source: Ambulatory Visit | Attending: Cardiology | Admitting: Cardiology

## 2023-11-30 DIAGNOSIS — I2510 Atherosclerotic heart disease of native coronary artery without angina pectoris: Secondary | ICD-10-CM | POA: Insufficient documentation

## 2023-12-01 LAB — ECHOCARDIOGRAM COMPLETE
AR max vel: 1.74 cm2
AV Area VTI: 1.6 cm2
AV Area mean vel: 1.73 cm2
AV Mean grad: 2 mmHg
AV Peak grad: 4.4 mmHg
AV Vena cont: 0.3 cm
Ao pk vel: 1.05 m/s
Area-P 1/2: 3.74 cm2
Calc EF: 58.6 %
MV M vel: 3.96 m/s
MV Peak grad: 62.7 mmHg
P 1/2 time: 622 ms
S' Lateral: 3.2 cm
Single Plane A2C EF: 56.9 %
Single Plane A4C EF: 59.9 %

## 2023-12-05 ENCOUNTER — Ambulatory Visit: Payer: Self-pay | Admitting: Cardiology

## 2023-12-10 ENCOUNTER — Other Ambulatory Visit: Payer: Self-pay | Admitting: Cardiology

## 2023-12-13 ENCOUNTER — Ambulatory Visit
Admission: RE | Admit: 2023-12-13 | Discharge: 2023-12-13 | Disposition: A | Discharge status: Home / Self Care | Source: Ambulatory Visit | Attending: Family Medicine | Admitting: Family Medicine

## 2023-12-13 DIAGNOSIS — Z87891 Personal history of nicotine dependence: Secondary | ICD-10-CM | POA: Diagnosis not present

## 2023-12-13 DIAGNOSIS — I25811 Atherosclerosis of native coronary artery of transplanted heart without angina pectoris: Secondary | ICD-10-CM | POA: Diagnosis not present

## 2023-12-13 DIAGNOSIS — Z122 Encounter for screening for malignant neoplasm of respiratory organs: Secondary | ICD-10-CM | POA: Diagnosis not present

## 2023-12-13 DIAGNOSIS — J449 Chronic obstructive pulmonary disease, unspecified: Secondary | ICD-10-CM | POA: Diagnosis not present

## 2023-12-13 DIAGNOSIS — E785 Hyperlipidemia, unspecified: Secondary | ICD-10-CM | POA: Diagnosis not present

## 2023-12-21 DIAGNOSIS — D2271 Melanocytic nevi of right lower limb, including hip: Secondary | ICD-10-CM | POA: Diagnosis not present

## 2023-12-21 DIAGNOSIS — L821 Other seborrheic keratosis: Secondary | ICD-10-CM | POA: Diagnosis not present

## 2023-12-21 DIAGNOSIS — L218 Other seborrheic dermatitis: Secondary | ICD-10-CM | POA: Diagnosis not present

## 2023-12-21 DIAGNOSIS — D1801 Hemangioma of skin and subcutaneous tissue: Secondary | ICD-10-CM | POA: Diagnosis not present

## 2023-12-21 DIAGNOSIS — D225 Melanocytic nevi of trunk: Secondary | ICD-10-CM | POA: Diagnosis not present

## 2023-12-21 DIAGNOSIS — Z85828 Personal history of other malignant neoplasm of skin: Secondary | ICD-10-CM | POA: Diagnosis not present

## 2023-12-21 DIAGNOSIS — D2272 Melanocytic nevi of left lower limb, including hip: Secondary | ICD-10-CM | POA: Diagnosis not present

## 2023-12-21 DIAGNOSIS — I788 Other diseases of capillaries: Secondary | ICD-10-CM | POA: Diagnosis not present

## 2023-12-22 ENCOUNTER — Other Ambulatory Visit: Payer: Self-pay

## 2023-12-22 DIAGNOSIS — Z122 Encounter for screening for malignant neoplasm of respiratory organs: Secondary | ICD-10-CM

## 2023-12-22 DIAGNOSIS — Z87891 Personal history of nicotine dependence: Secondary | ICD-10-CM

## 2023-12-30 DIAGNOSIS — I25811 Atherosclerosis of native coronary artery of transplanted heart without angina pectoris: Secondary | ICD-10-CM | POA: Diagnosis not present

## 2023-12-30 DIAGNOSIS — F419 Anxiety disorder, unspecified: Secondary | ICD-10-CM | POA: Diagnosis not present

## 2023-12-30 DIAGNOSIS — Z0000 Encounter for general adult medical examination without abnormal findings: Secondary | ICD-10-CM | POA: Diagnosis not present

## 2023-12-30 DIAGNOSIS — Z1331 Encounter for screening for depression: Secondary | ICD-10-CM | POA: Diagnosis not present

## 2023-12-30 DIAGNOSIS — I700 Atherosclerosis of aorta: Secondary | ICD-10-CM | POA: Diagnosis not present

## 2023-12-30 DIAGNOSIS — Z125 Encounter for screening for malignant neoplasm of prostate: Secondary | ICD-10-CM | POA: Diagnosis not present

## 2023-12-30 DIAGNOSIS — F439 Reaction to severe stress, unspecified: Secondary | ICD-10-CM | POA: Diagnosis not present

## 2023-12-30 DIAGNOSIS — Z23 Encounter for immunization: Secondary | ICD-10-CM | POA: Diagnosis not present

## 2023-12-30 DIAGNOSIS — Z79899 Other long term (current) drug therapy: Secondary | ICD-10-CM | POA: Diagnosis not present

## 2023-12-30 DIAGNOSIS — E785 Hyperlipidemia, unspecified: Secondary | ICD-10-CM | POA: Diagnosis not present

## 2023-12-30 DIAGNOSIS — J449 Chronic obstructive pulmonary disease, unspecified: Secondary | ICD-10-CM | POA: Diagnosis not present

## 2023-12-30 DIAGNOSIS — M5136 Other intervertebral disc degeneration, lumbar region with discogenic back pain only: Secondary | ICD-10-CM | POA: Diagnosis not present

## 2024-01-02 ENCOUNTER — Other Ambulatory Visit: Payer: Self-pay | Admitting: Cardiology

## 2024-01-04 ENCOUNTER — Telehealth: Payer: Self-pay

## 2024-01-04 NOTE — Telephone Encounter (Signed)
 Left message on My Chart with Echo results per Dr. Vanetta Shawl note. Routed to PCP.

## 2024-01-13 DIAGNOSIS — J449 Chronic obstructive pulmonary disease, unspecified: Secondary | ICD-10-CM | POA: Diagnosis not present

## 2024-01-13 DIAGNOSIS — Z79899 Other long term (current) drug therapy: Secondary | ICD-10-CM | POA: Diagnosis not present

## 2024-01-13 DIAGNOSIS — Z125 Encounter for screening for malignant neoplasm of prostate: Secondary | ICD-10-CM | POA: Diagnosis not present

## 2024-01-13 DIAGNOSIS — I25811 Atherosclerosis of native coronary artery of transplanted heart without angina pectoris: Secondary | ICD-10-CM | POA: Diagnosis not present

## 2024-01-13 DIAGNOSIS — E785 Hyperlipidemia, unspecified: Secondary | ICD-10-CM | POA: Diagnosis not present

## 2024-02-13 DIAGNOSIS — J449 Chronic obstructive pulmonary disease, unspecified: Secondary | ICD-10-CM | POA: Diagnosis not present

## 2024-02-13 DIAGNOSIS — E785 Hyperlipidemia, unspecified: Secondary | ICD-10-CM | POA: Diagnosis not present

## 2024-02-13 DIAGNOSIS — I25811 Atherosclerosis of native coronary artery of transplanted heart without angina pectoris: Secondary | ICD-10-CM | POA: Diagnosis not present

## 2024-03-14 DIAGNOSIS — I25811 Atherosclerosis of native coronary artery of transplanted heart without angina pectoris: Secondary | ICD-10-CM | POA: Diagnosis not present

## 2024-03-14 DIAGNOSIS — J449 Chronic obstructive pulmonary disease, unspecified: Secondary | ICD-10-CM | POA: Diagnosis not present

## 2024-03-14 DIAGNOSIS — Z23 Encounter for immunization: Secondary | ICD-10-CM | POA: Diagnosis not present

## 2024-03-14 DIAGNOSIS — E785 Hyperlipidemia, unspecified: Secondary | ICD-10-CM | POA: Diagnosis not present

## 2024-04-14 DIAGNOSIS — I25811 Atherosclerosis of native coronary artery of transplanted heart without angina pectoris: Secondary | ICD-10-CM | POA: Diagnosis not present

## 2024-04-14 DIAGNOSIS — E785 Hyperlipidemia, unspecified: Secondary | ICD-10-CM | POA: Diagnosis not present

## 2024-04-14 DIAGNOSIS — J449 Chronic obstructive pulmonary disease, unspecified: Secondary | ICD-10-CM | POA: Diagnosis not present

## 2024-05-14 DIAGNOSIS — J449 Chronic obstructive pulmonary disease, unspecified: Secondary | ICD-10-CM | POA: Diagnosis not present

## 2024-05-14 DIAGNOSIS — I25811 Atherosclerosis of native coronary artery of transplanted heart without angina pectoris: Secondary | ICD-10-CM | POA: Diagnosis not present

## 2024-05-14 DIAGNOSIS — E785 Hyperlipidemia, unspecified: Secondary | ICD-10-CM | POA: Diagnosis not present

## 2024-07-16 ENCOUNTER — Other Ambulatory Visit: Payer: Self-pay | Admitting: Cardiology
